# Patient Record
Sex: Male | Born: 1990 | Race: Black or African American | Hispanic: No | Marital: Single | Smoking: Never smoker
Health system: Southern US, Community
[De-identification: ages and names within clinical notes are randomized; demographics above are authoritative.]

## PROBLEM LIST (undated history)

## (undated) DIAGNOSIS — I10 Essential (primary) hypertension: Secondary | ICD-10-CM

## (undated) DIAGNOSIS — D649 Anemia, unspecified: Secondary | ICD-10-CM

## (undated) DIAGNOSIS — J4520 Mild intermittent asthma, uncomplicated: Secondary | ICD-10-CM

## (undated) DIAGNOSIS — K5909 Other constipation: Secondary | ICD-10-CM

## (undated) DIAGNOSIS — R7303 Prediabetes: Secondary | ICD-10-CM

## (undated) DIAGNOSIS — D573 Sickle-cell trait: Secondary | ICD-10-CM

## (undated) HISTORY — DX: Other constipation: K59.09

## (undated) HISTORY — DX: Anemia, unspecified: D64.9

## (undated) HISTORY — DX: Mild intermittent asthma, uncomplicated: J45.20

## (undated) HISTORY — DX: Morbid (severe) obesity due to excess calories: E66.01

---
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
## 2014-02-10 ENCOUNTER — Ambulatory Visit: Payer: Self-pay | Admitting: Family Medicine

## 2014-02-10 DIAGNOSIS — I517 Cardiomegaly: Secondary | ICD-10-CM

## 2014-11-09 ENCOUNTER — Emergency Department: Payer: Federal, State, Local not specified - PPO

## 2014-11-09 ENCOUNTER — Emergency Department
Admission: EM | Admit: 2014-11-09 | Discharge: 2014-11-09 | Disposition: A | Discharge status: Home / Self Care | Payer: Federal, State, Local not specified - PPO | Attending: Emergency Medicine | Admitting: Emergency Medicine

## 2014-11-09 ENCOUNTER — Encounter: Payer: Self-pay | Admitting: Emergency Medicine

## 2014-11-09 DIAGNOSIS — R109 Unspecified abdominal pain: Secondary | ICD-10-CM | POA: Insufficient documentation

## 2014-11-09 LAB — COMPREHENSIVE METABOLIC PANEL
ALT: 14 U/L — ABNORMAL LOW (ref 17–63)
AST: 18 U/L (ref 15–41)
Albumin: 3.8 g/dL (ref 3.5–5.0)
Alkaline Phosphatase: 69 U/L (ref 38–126)
Anion gap: 6 (ref 5–15)
BUN: 17 mg/dL (ref 6–20)
CO2: 29 mmol/L (ref 22–32)
Calcium: 9 mg/dL (ref 8.9–10.3)
Chloride: 105 mmol/L (ref 101–111)
Creatinine, Ser: 0.97 mg/dL (ref 0.61–1.24)
GFR calc Af Amer: >60 mL/min (ref >60)
GFR calc non Af Amer: >60 mL/min (ref >60)
Glucose, Bld: 109 mg/dL — ABNORMAL HIGH (ref 65–99)
Potassium: 4 mmol/L (ref 3.5–5.1)
Sodium: 140 mmol/L (ref 135–145)
Total Bilirubin: 0.5 mg/dL (ref 0.3–1.2)
Total Protein: 7.6 g/dL (ref 6.5–8.1)

## 2014-11-09 LAB — CBC
HCT: 36.6 % — ABNORMAL LOW (ref 40.0–52.0)
Hemoglobin: 11.8 g/dL — ABNORMAL LOW (ref 13.0–18.0)
MCH: 23.6 pg — ABNORMAL LOW (ref 26.0–34.0)
MCHC: 32.2 g/dL (ref 32.0–36.0)
MCV: 73.2 fL — ABNORMAL LOW (ref 80.0–100.0)
Platelets: 209 10*3/uL (ref 150–440)
RBC: 5 MIL/uL (ref 4.40–5.90)
RDW: 15.2 % — ABNORMAL HIGH (ref 11.5–14.5)
WBC: 7.5 10*3/uL (ref 3.8–10.6)

## 2014-11-09 LAB — URINALYSIS COMPLETE WITH MICROSCOPIC (ARMC ONLY)
Bacteria, UA: NONE SEEN
Bilirubin Urine: NEGATIVE
Glucose, UA: NEGATIVE mg/dL
Hgb urine dipstick: NEGATIVE
Ketones, ur: NEGATIVE mg/dL
Leukocytes, UA: NEGATIVE
Nitrite: NEGATIVE
Protein, ur: NEGATIVE mg/dL
Specific Gravity, Urine: 1.016 (ref 1.005–1.030)
Squamous Epithelial / LPF: NONE SEEN
pH: 6 (ref 5.0–8.0)

## 2014-11-09 LAB — LIPASE, BLOOD: Lipase: 30 U/L (ref 22–51)

## 2014-11-09 MED ORDER — GI COCKTAIL ~~LOC~~
ORAL | Status: AC
  Filled 2014-11-09: qty 30

## 2014-11-09 MED ORDER — GI COCKTAIL ~~LOC~~
30.0000 mL | Freq: Once | ORAL | Status: AC
  Administered 2014-11-09: 30 mL via ORAL

## 2014-11-09 NOTE — ED Notes (Signed)
Patient ambulatory to triage with steady gait, without difficulty or distress noted; pt reports pain to right side; seen at urgent care Monday and referred to GI but awaiting referral call; rx dyclocomine without relief; denies abd or back pain, stating only hurts in side; denies accomp symptoms, denies hx of same

## 2014-11-09 NOTE — ED Provider Notes (Signed)
[facility] Emergency Department Provider Note  ____________________________________________  Time seen: Approximately 7:49 AM  I have reviewed the triage vital signs and the nursing notes.   HISTORY  Chief Complaint Abdominal Pain    HPI [name] is a 24 y.o. male reports no past medical history, has been having achy pain in the right flank and right upper abdomen for the last 3 weeks. He first noticed an achy "discomfort" that is hard to describe in the right upper abdomen about 3 weeks ago, this lasted for a few days and went away. He was seen for at urgent care for this and advised to follow-up with GI. He states about 4 days ago this pain came back again, and continues to be persistent and achy in the right flank and right upper quadrant.  Of pertinence, he has not had any fever, nausea, vomiting, diarrhea, chest pain, trouble breathing. He denies having pain in the right groin or in the right lower abdomen. Walking and bumps do not aggravate the pain.  He presently describes just a mild achiness in the right upper abdomen.   History reviewed. No pertinent past medical history.  Patient Active Problem List   Diagnosis Date Noted  . CERUMEN IMPACTION, LEFT 07/26/2009  . FATIGUE 07/05/2009  . PENILE PAIN 02/06/2009  . NAUSEA 02/06/2009  . HEADACHE 09/12/2008  . CONSTIPATION 04/27/2008  . LOW BACK PAIN, MILD 04/27/2008  . ELEVATED BLOOD PRESSURE WITHOUT DIAGNOSIS OF HYPERTENSION 03/17/2008  . OBESITY, UNSPECIFIED 02/08/2008  . FECAL IMPACTION 02/08/2008    History reviewed. No pertinent past surgical history.  No current outpatient prescriptions on file.  Allergies Review of patient's allergies indicates no known allergies.  No family history on file.  Social History History  Substance Use Topics  . Smoking status: Never Smoker   . Smokeless tobacco: Never Used  . Alcohol Use: Not on file    Review of Systems Constitutional: No  fever/chills Eyes: No visual changes. ENT: No sore throat. Cardiovascular: Denies chest pain. Respiratory: Denies shortness of breath. Gastrointestinal: See history of present illness No nausea, no vomiting.  No diarrhea.  No constipation. Genitourinary: Negative for dysuria. Musculoskeletal: Negative for back pain. Skin: Negative for rash. Neurological: Negative for headaches, focal weakness or numbness. No testicular pain. No swelling in his groin or testicles. 10-point ROS otherwise negative.  ____________________________________________   PHYSICAL EXAM:  VITAL SIGNS: ED Triage Vitals  Enc Vitals Group     BP 11/09/14 0654 176/99 mmHg     Pulse Rate 11/09/14 0654 89     Resp 11/09/14 0654 20     Temp 11/09/14 0654 97.9 F (36.6 C)     Temp Source 11/09/14 0654 Oral     SpO2 11/09/14 0654 99 %     Weight 11/09/14 0654 415 lb (188.243 kg)     Height 11/09/14 0654  (1.93 m)     Head Cir --      Peak Flow --      Pain Score 11/09/14 0655 6     Pain Loc --      Pain Edu? --      Excl. in GC? --     Constitutional: Alert and oriented. Well appearing and in no acute distress. The patient is significantly overweight. Eyes: Conjunctivae are normal. PERRL. EOMI. Head: Atraumatic. Nose: No congestion/rhinnorhea. Mouth/Throat: Mucous membranes are moist.  Oropharynx non-erythematous. Neck: No stridor.   Cardiovascular: Normal rate, regular rhythm. Grossly normal heart sounds.  Good  peripheral circulation. Respiratory: Normal respiratory effort.  No retractions. Lungs CTAB. Gastrointestinal: Soft and nontender except for mild discomfort in the right flank and right upper quadrant. Eulah Pont sign is negative, though somewhat difficult to perform due to body habitus. No distention. No abdominal bruits. No CVA tenderness. There is no referred pain to the right with palpation in the left lower quadrant. There is no peritonitis. There is no rebound pain or guarding. Of note, deep  palpation in the right lower quadrant is nontender. Musculoskeletal: No lower extremity tenderness nor edema.  No joint effusions. Neurologic:  Normal speech and language. No gross focal neurologic deficits are appreciated. Speech is normal. No gait instability. Skin:  Skin is warm, dry and intact. No rash noted. Psychiatric: Mood and affect are normal. Speech and behavior are normal.  ____________________________________________   LABS (all labs ordered are listed, but only abnormal results are displayed)  Labs Reviewed  CBC  COMPREHENSIVE METABOLIC PANEL  LIPASE, BLOOD  URINALYSIS COMPLETEWITH MICROSCOPIC ([facility] ONLY)   ____________________________________________  EKG   ____________________________________________  RADIOLOGY  IMPRESSION: Probable fatty infiltration of liver.  Splenomegaly.  Small RIGHT renal cyst.   Electronically Signed By: Ulyses Southward M.D. On: 11/09/2014 09:56          DG Abd 1 View (Final result) Result time: 11/09/14 08:07:06   Final result by Rad Results In Interface (11/09/14 08:07:06)   Narrative:   CLINICAL DATA: RIGHT-side abdominal pain  EXAM: ABDOMEN - 1 VIEW  COMPARISON: 04/25/2008  FINDINGS: Normal bowel gas pattern.  No bowel dilatation, bowel wall thickening, or evidence of obstruction.  Osseous structures normal.  No urinary tract calcification.  IMPRESSION: Normal exam.     ____________________________________________   PROCEDURES  Procedure(s) performed: None  Critical Care performed: No  ____________________________________________   INITIAL IMPRESSION / ASSESSMENT AND PLAN / ED COURSE  Pertinent labs & imaging results that were available during my care of the patient were reviewed by me and considered in my medical decision making (see chart for details).  Mr. Justiniano presents with right sided abdominal pain is been intermittent for the last 2-3 weeks. He was seen in urgent care and  advised to follow-up with GI. He states his pain is ongoing and just not getting better. Based on his symptomatology and exam, I do not believe that he had represents an acute abdomen. We will however evaluate him for the evidence of cholelithiasis, nephrolithiasis, UTI. He has no symptoms consistent with acute bowel obstruction or appendicitis.  I did discuss with the patient that due to his weight we would not be able to obtain a CT scan. We discussed the benefits of CT which includes visualization of the kidneys, gallbladder, bowels, and appendix. I did discuss with him that in order to obtain CT imaging we would have to transfer him to different hospital, and he understands this. After our discussion and risks and benefits of transfer and CT scan, patient and I are both agreeable that we will obtain ultrasound and x-ray here. I do not find him to have high risk factors for appendicitis. The patient is quite agreeable, we will proceed with imaging here. I did discuss with them, that if he is to develop a fever vomiting, severe pain, pain in the right lower abdomen, or worsening symptoms that he should return to have a CT scan arranged. He is agreeable with this plan.  ----------------------------------------- 10:19 AM on 11/09/2014 -----------------------------------------  Patient's labs, x-ray, and ultrasound are reassuring. He is awake alert and  in no distress. On repeat exam he has no right lower quadrant pain. At this point, I discussed the patient close return precautions. He is quite agreeable. He will follow-up with gastroenterology as previously planned, and he will return to the ER right away if he develops a fever, pain in the right lower abdomen, vomiting, worsening symptoms or other new concerns arise. ____________________________________________   FINAL CLINICAL IMPRESSION(S) / ED DIAGNOSES  Final diagnoses:  Right flank discomfort      Sharyn Creamer, MD 11/09/14 1020

## 2014-11-09 NOTE — Discharge Instructions (Signed)
Abdominal Pain  Please call gastroenterology to set up a follow-up appointment. As we discussed, return to the ER right away if you develop a fever, severe pain in the right lower abdomen, vomiting, worsening symptoms, or other new concerns arise.  Many things can cause abdominal pain. Usually, abdominal pain is not caused by a disease and will improve without treatment. It can often be observed and treated at home. Your health care provider will do a physical exam and possibly order blood tests and X-rays to help determine the seriousness of your pain. However, in many cases, more time must pass before a clear cause of the pain can be found. Before that point, your health care provider may not know if you need more testing or further treatment. HOME CARE INSTRUCTIONS  Monitor your abdominal pain for any changes. The following actions may help to alleviate any discomfort you are experiencing:  Only take over-the-counter or prescription medicines as directed by your health care provider.  Do not take laxatives unless directed to do so by your health care provider.  Try a clear liquid diet (broth, tea, or water) as directed by your health care provider. Slowly move to a bland diet as tolerated. SEEK MEDICAL CARE IF:  You have unexplained abdominal pain.  You have abdominal pain associated with nausea or diarrhea.  You have pain when you urinate or have a bowel movement.  You experience abdominal pain that wakes you in the night.  You have abdominal pain that is worsened or improved by eating food.  You have abdominal pain that is worsened with eating fatty foods.  You have a fever. SEEK IMMEDIATE MEDICAL CARE IF:   Your pain does not go away within 2 hours.  You keep throwing up (vomiting).  Your pain is felt only in portions of the abdomen, such as the right side or the left lower portion of the abdomen.  You pass bloody or black tarry stools. MAKE SURE YOU:  Understand these  instructions.   Will watch your condition.   Will get help right away if you are not doing well or get worse.  Document Released: 02/19/2005 Document Revised: 05/17/2013 Document Reviewed: 01/19/2013 [facility] Patient Information 2015 [place], [place]. This information is not intended to replace advice given to you by your health care provider. Make sure you discuss any questions you have with your health care provider.

## 2014-12-08 ENCOUNTER — Other Ambulatory Visit: Payer: Self-pay

## 2014-12-08 ENCOUNTER — Ambulatory Visit: Payer: Federal, State, Local not specified - PPO

## 2014-12-08 ENCOUNTER — Emergency Department: Payer: Federal, State, Local not specified - PPO

## 2014-12-08 ENCOUNTER — Emergency Department
Admission: EM | Admit: 2014-12-08 | Discharge: 2014-12-08 | Disposition: A | Discharge status: Home / Self Care | Payer: Federal, State, Local not specified - PPO | Attending: Student | Admitting: Student

## 2014-12-08 DIAGNOSIS — J029 Acute pharyngitis, unspecified: Secondary | ICD-10-CM | POA: Diagnosis not present

## 2014-12-08 DIAGNOSIS — J45901 Unspecified asthma with (acute) exacerbation: Secondary | ICD-10-CM | POA: Insufficient documentation

## 2014-12-08 DIAGNOSIS — R000 Tachycardia, unspecified: Secondary | ICD-10-CM | POA: Diagnosis not present

## 2014-12-08 DIAGNOSIS — R0602 Shortness of breath: Secondary | ICD-10-CM

## 2014-12-08 DIAGNOSIS — R079 Chest pain, unspecified: Secondary | ICD-10-CM | POA: Diagnosis not present

## 2014-12-08 LAB — COMPREHENSIVE METABOLIC PANEL
ALT: 14 U/L — ABNORMAL LOW (ref 17–63)
AST: 23 U/L (ref 15–41)
Albumin: 3.9 g/dL (ref 3.5–5.0)
Alkaline Phosphatase: 71 U/L (ref 38–126)
Anion gap: 8 (ref 5–15)
BUN: 12 mg/dL (ref 6–20)
CO2: 27 mmol/L (ref 22–32)
Calcium: 9 mg/dL (ref 8.9–10.3)
Chloride: 103 mmol/L (ref 101–111)
Creatinine, Ser: 0.88 mg/dL (ref 0.61–1.24)
GFR calc Af Amer: >60 mL/min (ref >60)
GFR calc non Af Amer: >60 mL/min (ref >60)
Glucose, Bld: 134 mg/dL — ABNORMAL HIGH (ref 65–99)
Potassium: 3.4 mmol/L — ABNORMAL LOW (ref 3.5–5.1)
Sodium: 138 mmol/L (ref 135–145)
Total Bilirubin: 0.6 mg/dL (ref 0.3–1.2)
Total Protein: 7.7 g/dL (ref 6.5–8.1)

## 2014-12-08 LAB — CBC WITH DIFFERENTIAL/PLATELET
Basophils Absolute: 0.1 10*3/uL (ref 0–0.1)
Basophils Relative: 1 %
Eosinophils Absolute: 0 10*3/uL (ref 0–0.7)
Eosinophils Relative: 0 %
HCT: 36.7 % — ABNORMAL LOW (ref 40.0–52.0)
Hemoglobin: 12 g/dL — ABNORMAL LOW (ref 13.0–18.0)
Lymphocytes Relative: 9 %
Lymphs Abs: 0.9 10*3/uL — ABNORMAL LOW (ref 1.0–3.6)
MCH: 23.7 pg — ABNORMAL LOW (ref 26.0–34.0)
MCHC: 32.8 g/dL (ref 32.0–36.0)
MCV: 72.3 fL — ABNORMAL LOW (ref 80.0–100.0)
Monocytes Absolute: 0.3 10*3/uL (ref 0.2–1.0)
Monocytes Relative: 3 %
Neutro Abs: 7.9 10*3/uL — ABNORMAL HIGH (ref 1.4–6.5)
Neutrophils Relative %: 87 %
Platelets: 173 10*3/uL (ref 150–440)
RBC: 5.07 MIL/uL (ref 4.40–5.90)
RDW: 15.1 % — ABNORMAL HIGH (ref 11.5–14.5)
WBC: 9.1 10*3/uL (ref 3.8–10.6)

## 2014-12-08 LAB — LIPASE, BLOOD: Lipase: 14 U/L — ABNORMAL LOW (ref 22–51)

## 2014-12-08 LAB — TROPONIN I: Troponin I: <0.03 ng/mL (ref <0.031)

## 2014-12-08 LAB — FIBRIN DERIVATIVES D-DIMER (ARMC ONLY): Fibrin derivatives D-dimer (ARMC): 594.46 — ABNORMAL HIGH (ref 0–499)

## 2014-12-08 LAB — POCT RAPID STREP A: Streptococcus, Group A Screen (Direct): NEGATIVE

## 2014-12-08 MED ORDER — TECHNETIUM TO 99M ALBUMIN AGGREGATED
6.5300 | Freq: Once | INTRAVENOUS | Status: AC | PRN
  Administered 2014-12-08: 6.53 via INTRAVENOUS

## 2014-12-08 MED ORDER — SODIUM CHLORIDE 0.9 % IV BOLUS (SEPSIS)
500.0000 mL | Freq: Once | INTRAVENOUS | Status: AC
  Administered 2014-12-08: 500 mL via INTRAVENOUS

## 2014-12-08 MED ORDER — TECHNETIUM TC 99M DIETHYLENETRIAME-PENTAACETIC ACID
54.4600 | Freq: Once | INTRAVENOUS | Status: AC | PRN
  Administered 2014-12-08: 54.46 via RESPIRATORY_TRACT

## 2014-12-08 MED ORDER — KETOROLAC TROMETHAMINE 30 MG/ML IJ SOLN
30.0000 mg | Freq: Once | INTRAMUSCULAR | Status: AC
  Administered 2014-12-08: 30 mg via INTRAVENOUS
  Filled 2014-12-08: qty 1

## 2014-12-08 MED ORDER — ALBUTEROL SULFATE HFA 108 (90 BASE) MCG/ACT IN AERS: 2.0000 | INHALATION_SPRAY | Freq: Four times a day (QID) | RESPIRATORY_TRACT | Status: DC | PRN

## 2014-12-08 NOTE — ED Notes (Signed)
Patient transported to X-ray 

## 2014-12-08 NOTE — ED Notes (Signed)
Pt still awaiting Nuclear Medicine procedure.

## 2014-12-08 NOTE — Discharge Instructions (Signed)

## 2014-12-08 NOTE — ED Notes (Signed)
Pt to Nuclear Medicine.

## 2014-12-08 NOTE — ED Provider Notes (Addendum)
[facility] Emergency Department Provider Note  ____________________________________________  Time seen: Approximately 7:07 AM  I have reviewed the triage vital signs and the nursing notes.   HISTORY  Chief Complaint Breathing Problem    HPI [name] is a 24 y.o. male with remote history of asthma presents for evaluation of shortness of breath and mild pleuritic chest pain, sudden onset this morning. Patient reports that he developed sore throat yesterday and he has had "cold sweats". Today he awoke from sleep and was walking his small dog outside when he suddenly began to feel short of breath with some chest pain. On EMS arrival, wheezing was auscultated and he received albuterol, Atrovent and Solu-Medrol. At this time he reports he does feel better. He reports that earlier this morning he also had 2 episodes of nonbloody nonbilious emesis. 2 days ago he had several episodes of nonbloody diarrhea. Current severity of symptoms is mild. No modifying factors. He denies any hemoptysis, recent surgeries or recent prolonged period of immobilization, personal or family history of DVT or PE, hormone use, leg swelling.   No past medical history on file.  Patient Active Problem List   Diagnosis Date Noted  . CERUMEN IMPACTION, LEFT 07/26/2009  . FATIGUE 07/05/2009  . PENILE PAIN 02/06/2009  . NAUSEA 02/06/2009  . HEADACHE 09/12/2008  . CONSTIPATION 04/27/2008  . LOW BACK PAIN, MILD 04/27/2008  . ELEVATED BLOOD PRESSURE WITHOUT DIAGNOSIS OF HYPERTENSION 03/17/2008  . OBESITY, UNSPECIFIED 02/08/2008  . FECAL IMPACTION 02/08/2008    No past surgical history on file.  Current Outpatient Rx  Name  Route  Sig  Dispense  Refill  . dicyclomine (BENTYL) 10 MG capsule   Oral   Take 1 capsule by mouth 4 (four) times daily as needed.      0     Allergies Review of patient's allergies indicates no known allergies.  No family history on file.  Social  History History  Substance Use Topics  . Smoking status: Never Smoker   . Smokeless tobacco: Never Used  . Alcohol Use: Not on file    Review of Systems Constitutional: No fever/chills Eyes: No visual changes. ENT: No sore throat. Cardiovascular: + chest pain. Respiratory: + shortness of breath. Gastrointestinal: No abdominal pain.  No nausea, + vomiting.  +diarrhea.  No constipation. Genitourinary: Negative for dysuria. Musculoskeletal: Negative for back pain. Skin: Negative for rash. Neurological: Negative for headaches, focal weakness or numbness.  10-point ROS otherwise negative.  ____________________________________________   PHYSICAL EXAM:  VITAL SIGNS: ED Triage Vitals  Enc Vitals Group     BP 12/08/14 0703 136/87 mmHg     Pulse Rate 12/08/14 0703 102     Resp --      Temp 12/08/14 0703 97.7 F (36.5 C)     Temp Source 12/08/14 0703 Oral     SpO2 12/08/14 0703 100 %     Weight 12/08/14 0703 420 lb (190.511 kg)     Height 12/08/14 0703 6\' 4"  (1.93 m)     Head Cir --      Peak Flow --      Pain Score 12/08/14 0704 5     Pain Loc --      Pain Edu? --      Excl. in GC? --     Constitutional: Alert and oriented. Well appearing and in no acute distress. Eyes: Conjunctivae are normal. PERRL. EOMI. Head: Atraumatic. Nose: No congestion/rhinnorhea. Mouth/Throat: Mucous membranes are moist.  Oropharynx mildly  erythematous. Neck: No stridor.   Cardiovascular: mildly tachycarduc rate, regular rhythm. Grossly normal heart sounds.  Good peripheral circulation. Respiratory: Normal respiratory effort.  No retractions. Lungs CTAB. Gastrointestinal: Soft and nontender. No distention. No abdominal bruits. No CVA tenderness. Genitourinary: deferred Musculoskeletal: No lower extremity tenderness nor edema.  No joint effusions. Neurologic:  Normal speech and language. No gross focal neurologic deficits are appreciated. No gait instability. Skin:  Skin is warm, dry and  intact. No rash noted. Psychiatric: Mood and affect are normal. Speech and behavior are normal.  ____________________________________________   LABS (all labs ordered are listed, but only abnormal results are displayed)  Labs Reviewed  CBC WITH DIFFERENTIAL/PLATELET  COMPREHENSIVE METABOLIC PANEL  TROPONIN I  LIPASE, BLOOD   ____________________________________________  EKG  ED ECG REPORT I, Gayla Doss, the attending physician, personally viewed and interpreted this ECG.   Date: 12/08/2014  EKG Time: 08:03  Rate: 98  Rhythm: normal sinus rhythm  Axis: normal  Intervals:none  ST&T Change: no acute ST segment elevation, LVH   VQ scan IMPRESSION: No ventilation or perfusion defects. This study constitutes a very low probability of pulmonary embolus.  ____________________________________________  RADIOLOGY  CXR FINDINGS: The heart size and mediastinal contours are within normal limits. Both lungs are clear. The visualized skeletal structures are unremarkable.  IMPRESSION: Negative. No active cardiopulmonary disease.  ____________________________________________   PROCEDURES  Procedure(s) performed: None  Critical Care performed: No  ____________________________________________   INITIAL IMPRESSION / ASSESSMENT AND PLAN / ED COURSE  Pertinent labs & imaging results that were available during my care of the patient were reviewed by me and considered in my medical decision making (see chart for details).  [name] is a 24 y.o. male with remote history of asthma presents for evaluation of shortness of breath and mild chest pain, sudden onset this morning. On exam, he is generally well-appearing and in no acute distress. Mildly tachycardic but vital signs otherwise stable. He has mild erythema of the oropharynx. No wheeze/lungs clear. Plan for basic cardiac labs that I doubt ACS, chest x-ray, we'll swab for strep, given his complaint of pleuritic hest  pain, tachycardia, we'll obtain d-dimer, we'll give IV fluids and Toradol.  ----------------------------------------- 11:34 AM on 12/08/2014 -----------------------------------------  Strep negative. Labs generally unremarkable with the exception of D-dimer which is elevated. The patient is above the weight limit for CT scanner therefore we will obtain a VQ scan to rule out PE.  ----------------------------------------- 3:02 PM on 12/08/2014 ----------------------------------------- VQ scan negative for PE. Patient is asymptomatic.tachycardia resolved. No vomiting since arrival to the emergency department he is tolerating by mouth intake. Suspect possible bronchitis as the cause of his shortness of breath this morning in the setting of acute viral illness. We'll discharge with albuterol inhaler, return precautions, close PCP follow-up. Doubt acute aortic dissection. Return precautions discussed and he is comfortable with the discharge plan.  ____________________________________________   FINAL CLINICAL IMPRESSION(S) / ED DIAGNOSES  Final diagnoses:  SOB (shortness of breath)  Chest pain, unspecified chest pain type  Sore throat      Gayla Doss, MD 12/08/14 1503  Gayla Doss, MD 12/08/14 1507

## 2014-12-08 NOTE — ED Notes (Signed)
Pt in via EMS from with c/o difficulty breathing this am. EMS reports pt wih hx of asthma as child but no issues until this am. EMS reports pt wheezing in lungs and tight. EMS administered 15ml of albuterol, 1.0 atrovent and  solu-medrol.

## 2014-12-10 LAB — CULTURE, GROUP A STREP (THRC)

## 2015-03-30 ENCOUNTER — Encounter: Payer: Self-pay | Admitting: Family Medicine

## 2018-01-21 ENCOUNTER — Ambulatory Visit: Payer: Self-pay | Admitting: Family Medicine

## 2018-04-19 ENCOUNTER — Encounter

## 2018-04-19 ENCOUNTER — Encounter: Payer: Self-pay | Admitting: Family Medicine

## 2018-04-19 ENCOUNTER — Ambulatory Visit: Payer: Federal, State, Local not specified - PPO | Admitting: Family Medicine

## 2018-04-19 DIAGNOSIS — Z131 Encounter for screening for diabetes mellitus: Secondary | ICD-10-CM | POA: Diagnosis not present

## 2018-04-19 DIAGNOSIS — Z130 Encounter for screening for diseases of the blood and blood-forming organs and certain disorders involving the immune mechanism: Secondary | ICD-10-CM

## 2018-04-19 DIAGNOSIS — Z8349 Family history of other endocrine, nutritional and metabolic diseases: Secondary | ICD-10-CM

## 2018-04-19 DIAGNOSIS — J4520 Mild intermittent asthma, uncomplicated: Secondary | ICD-10-CM

## 2018-04-19 DIAGNOSIS — Z23 Encounter for immunization: Secondary | ICD-10-CM | POA: Diagnosis not present

## 2018-04-19 DIAGNOSIS — Z13220 Encounter for screening for lipoid disorders: Secondary | ICD-10-CM

## 2018-04-19 MED ORDER — ALBUTEROL SULFATE HFA 108 (90 BASE) MCG/ACT IN AERS: 2.0000 | INHALATION_SPRAY | Freq: Four times a day (QID) | RESPIRATORY_TRACT | 0 refills | Status: DC | PRN

## 2018-04-19 MED ORDER — MONTELUKAST SODIUM 10 MG PO TABS: 10.0000 mg | ORAL_TABLET | Freq: Every day | ORAL | 0 refills | Status: DC

## 2018-04-19 NOTE — Patient Instructions (Signed)
Obesity, Adult  Obesity is the condition of having too much total body fat. Being overweight or obese means that your weight is greater than what is considered healthy for your body size. Obesity is determined by a measurement called BMI. BMI is an estimate of body fat and is calculated from height and weight. For adults, a BMI of 30 or higher is considered obese.  Obesity can eventually lead to other health concerns and major illnesses, including:  · Stroke.  · Coronary artery disease (CAD).  · Type 2 diabetes.  · Some types of cancer, including cancers of the colon, breast, uterus, and gallbladder.  · Osteoarthritis.  · High blood pressure (hypertension).  · High cholesterol.  · Sleep apnea.  · Gallbladder stones.  · Infertility problems.    What are the causes?  The main cause of obesity is taking in (consuming) more calories than your body uses for energy. Other factors that contribute to this condition may include:  · Being born with genes that make you more likely to become obese.  · Having a medical condition that causes obesity. These conditions include:  ? Hypothyroidism.  ? Polycystic ovarian syndrome (PCOS).  ? Binge-eating disorder.  ? Cushing syndrome.  · Taking certain medicines, such as steroids, antidepressants, and seizure medicines.  · Not being physically active (sedentary lifestyle).  · Living where there are limited places to exercise safely or buy healthy foods.  · Not getting enough sleep.    What increases the risk?  The following factors may increase your risk of this condition:  · Having a family history of obesity.  · Being a woman of African-American descent.  · Being a man of Hispanic descent.    What are the signs or symptoms?  Having excessive body fat is the main symptom of this condition.  How is this diagnosed?  This condition may be diagnosed based on:  · Your symptoms.  · Your medical history.  · A physical exam. Your health care provider may measure:  ? Your BMI. If you are an  adult with a BMI between 25 and less than 30, you are considered overweight. If you are an adult with a BMI of 30 or higher, you are considered obese.  ? The distances around your hips and your waist (circumferences). These may be compared to each other to help diagnose your condition.  ? Your skinfold thickness. Your health care provider may gently pinch a fold of your skin and measure it.    How is this treated?  Treatment for this condition often includes changing your lifestyle. Treatment may include some or all of the following:  · Dietary changes. Work with your health care provider and a dietitian to set a weight-loss goal that is healthy and reasonable for you. Dietary changes may include eating:  ? Smaller portions. A portion size is the amount of a particular food that is healthy for you to eat at one time. This varies from person to person.  ? Low-calorie or low-fat options.  ? More whole grains, fruits, and vegetables.  · Regular physical activity. This may include aerobic activity (cardio) and strength training.  · Medicine to help you lose weight. Your health care provider may prescribe medicine if you are unable to lose 1 pound a week after 6 weeks of eating more healthily and doing more physical activity.  · Surgery. Surgical options may include gastric banding and gastric bypass. Surgery may be done if:  ? Other   treatments have not helped to improve your condition.  ? You have a BMI of 40 or higher.  ? You have life-threatening health problems related to obesity.    Follow these instructions at home:    Eating and drinking    · Follow recommendations from your health care provider about what you eat and drink. Your health care provider may advise you to:  ? Limit fast foods, sweets, and processed snack foods.  ? Choose low-fat options, such as low-fat milk instead of whole milk.  ? Eat 5 or more servings of fruits or vegetables every day.  ? Eat at home more often. This gives you more control over  what you eat.  ? Choose healthy foods when you eat out.  ? Learn what a healthy portion size is.  ? Keep low-fat snacks on hand.  ? Avoid sugary drinks, such as soda, fruit juice, iced tea sweetened with sugar, and flavored milk.  ? Eat a healthy breakfast.  · Drink enough water to keep your urine clear or pale yellow.  · Do not go without eating for long periods of time (do not fast) or follow a fad diet. Fasting and fad diets can be unhealthy and even dangerous.  Physical Activity  · Exercise regularly, as told by your health care provider. Ask your health care provider what types of exercise are safe for you and how often you should exercise.  · Warm up and stretch before being active.  · Cool down and stretch after being active.  · Rest between periods of activity.  Lifestyle  · Limit the time that you spend in front of your TV, computer, or video game system.  · Find ways to reward yourself that do not involve food.  · Limit alcohol intake to no more than 1 drink a day for nonpregnant women and 2 drinks a day for men. One drink equals 12 oz of beer, 5 oz of wine, or 1½ oz of hard liquor.  General instructions  · Keep a weight loss journal to keep track of the food you eat and how much you exercise you get.  · Take over-the-counter and prescription medicines only as told by your health care provider.  · Take vitamins and supplements only as told by your health care provider.  · Consider joining a support group. Your health care provider may be able to recommend a support group.  · Keep all follow-up visits as told by your health care provider. This is important.  Contact a health care provider if:  · You are unable to meet your weight loss goal after 6 weeks of dietary and lifestyle changes.  This information is not intended to replace advice given to you by your health care provider. Make sure you discuss any questions you have with your health care provider.  Document Released: 06/19/2004 Document Revised:  10/15/2015 Document Reviewed: 02/28/2015  Elsevier Interactive Patient Education © 2018 [employer]

## 2018-04-19 NOTE — Progress Notes (Signed)
Name: [name]   MRN: [id number]    DOB: 06-10-1990   Date:04/19/2018       Progress Note  Subjective  Chief Complaint  Chief Complaint  Patient presents with  . Establish Care    HPI  Morbid obesity: he has always been obese. He has a family history of obesity also. Today is his heaviest weight. Lost 40 lbs with a Systems analyst and dietary modification earlier this year, but gained it back. He eats breakfast mostly at home, packs lunch but eats out of dinner most nights. He drinks sweet tea. He is currently not exercising on a regular basis. He states he likes to eat and does not have good portion control habits.   Asthma: he used to have episodes as a young child, he states get SOB a couple of times a week, improves with rest and taking slow breaths. He states he wakes up and when he is in the shower he has a productive coughing spell that makes him gag, not associated with nausea, no heartburn and no history of regurgitation.    Patient Active Problem List   Diagnosis Date Noted  . Asthma, mild intermittent   . Morbid obesity (HCC) 02/08/2008    History reviewed. No pertinent surgical history.  Family History  Problem Relation Age of Onset  . Obesity Mother   . Kidney disease Father        mass  . Obesity Sister     Social History   Socioeconomic History  . Marital status: Single    Spouse name: Not on file  . Number of children: 0  . Years of education: Not on file  . Highest education level: Some college, no degree  Occupational History  . Occupation: Management consultant   Social Needs  . Financial resource strain: Not hard at all  . Food insecurity:    Worry: Never true    Inability: Never true  . Transportation needs:    Medical: No    Non-medical: No  Tobacco Use  . Smoking status: Never Smoker  . Smokeless tobacco: Never Used  Substance and Sexual Activity  . Alcohol use: Yes    Alcohol/week: 1.0 standard drinks    Types: 1 Cans of beer  per week  . Drug use: Never  . Sexual activity: Not Currently    Partners: Female    Birth control/protection: Condom    Comment: not always   Lifestyle  . Physical activity:    Days per week: 2 days    Minutes per session: 20 min  . Stress: To some extent  Relationships  . Social connections:    Talks on phone: More than three times a week    Gets together: Twice a week    Attends religious service: Never    Active member of club or organization: No    Attends meetings of clubs or organizations: Never    Relationship status: Never married  . Intimate partner violence:    Fear of current or ex partner: No    Emotionally abused: No    Physically abused: No    Forced sexual activity: No  Other Topics Concern  . Not on file  Social History Narrative  . Not on file     Current Outpatient Medications:  .  albuterol (PROVENTIL HFA;VENTOLIN HFA) 108 (90 BASE) MCG/ACT inhaler, Inhale 2 puffs into the lungs every 6 (six) hours as needed for wheezing or shortness of breath. (Patient not taking: Reported  on 04/19/2018), Disp: 1 Inhaler, Rfl: 0  No Known Allergies  I personally reviewed active problem list, medication list, allergies, family history, social history with the patient/caregiver today.   ROS  Constitutional: Negative for fever, positive for weight change.  Respiratory: Negative for cough and shortness of breath.   Cardiovascular: Negative for chest pain or palpitations.  Gastrointestinal: Negative for abdominal pain, no bowel changes.  Musculoskeletal: Negative for gait problem or joint swelling.  Skin: Negative for rash.  Neurological: Negative for dizziness or headache.  No other specific complaints in a complete review of systems (except as listed in HPI above).  Objective  Vitals:   04/19/18 1404  BP: 126/80  Pulse: 96  Resp: 12  Temp: 98 F (36.7 C)  TempSrc: Oral  SpO2: 97%  Weight: (!) 439 lb 9.6 oz (199.4 kg)  Height: 6' 1.25" (1.861 m)     Body mass index is 57.6 kg/m.  Physical Exam  Constitutional: Patient appears well-developed and well-nourished. Obese  No distress.  HEENT: head atraumatic, normocephalic, pupils equal and reactive to light,  neck supple, throat within normal limits Cardiovascular: Normal rate, regular rhythm and normal heart sounds.  No murmur heard. No BLE edema. Pulmonary/Chest: Effort normal and breath sounds normal. No respiratory distress. Abdominal: Soft.  There is no tenderness. Psychiatric: Patient has a normal mood and affect. behavior is normal. Judgment and thought content normal.  PHQ2/9: Depression screen PHQ 2/9 04/19/2018  Decreased Interest 0  Down, Depressed, Hopeless 0  PHQ - 2 Score 0     Fall Risk: Fall Risk  04/19/2018  Falls in the past year? 0  Number falls in past yr: 0  Injury with Fall? 0     Functional Status Survey: Is the patient deaf or have difficulty hearing?: No Does the patient have difficulty seeing, even when wearing glasses/contacts?: No Does the patient have difficulty concentrating, remembering, or making decisions?: No Does the patient have difficulty walking or climbing stairs?: No Does the patient have difficulty dressing or bathing?: No Does the patient have difficulty doing errands alone such as visiting a doctor's office or shopping?: No    Assessment & Plan   1. Morbid obesity (HCC)  - COMPLETE METABOLIC PANEL WITH GFR - TSH  2. Flu vaccine need  - Flu Vaccine QUAD 36+ mos IM  3. Family history of obesity   4. Diabetes mellitus screening  - Hemoglobin A1c  5. Lipid screening  - Lipid panel  6. Screening for iron deficiency anemia  - CBC with Differential/Platelet  7. Mild intermittent asthma without complication  - montelukast (SINGULAIR) 10 MG tablet; Take 1 tablet (10 mg total) by mouth at bedtime.  Dispense: 90 tablet; Refill: 0 - albuterol (PROVENTIL HFA;VENTOLIN HFA) 108 (90 Base) MCG/ACT inhaler; Inhale 2  puffs into the lungs every 6 (six) hours as needed for wheezing or shortness of breath.  Dispense: 1 Inhaler; Refill: 0

## 2018-04-21 ENCOUNTER — Other Ambulatory Visit: Payer: Self-pay

## 2018-04-21 DIAGNOSIS — D649 Anemia, unspecified: Secondary | ICD-10-CM

## 2018-04-22 LAB — LIPID PANEL
Cholesterol: 168 mg/dL (ref <200)
HDL: 38 mg/dL — ABNORMAL LOW (ref >40)
LDL Cholesterol (Calc): 113 mg/dL (calc) — ABNORMAL HIGH
Non-HDL Cholesterol (Calc): 130 mg/dL (calc) — ABNORMAL HIGH (ref <130)
Total CHOL/HDL Ratio: 4.4 (calc) (ref <5.0)
Triglycerides: 74 mg/dL (ref <150)

## 2018-04-22 LAB — COMPLETE METABOLIC PANEL WITH GFR
AG Ratio: 1.4 (calc) (ref 1.0–2.5)
ALT: 11 U/L (ref 9–46)
AST: 13 U/L (ref 10–40)
Albumin: 4.1 g/dL (ref 3.6–5.1)
Alkaline phosphatase (APISO): 69 U/L (ref 40–115)
BUN: 16 mg/dL (ref 7–25)
CO2: 27 mmol/L (ref 20–32)
Calcium: 9.4 mg/dL (ref 8.6–10.3)
Chloride: 103 mmol/L (ref 98–110)
Creat: 0.96 mg/dL (ref 0.60–1.35)
GFR, Est African American: 127 mL/min/{1.73_m2} (ref >=60)
GFR, Est Non African American: 109 mL/min/{1.73_m2} (ref >=60)
Globulin: 3 g/dL (calc) (ref 1.9–3.7)
Glucose, Bld: 83 mg/dL (ref 65–99)
Potassium: 3.9 mmol/L (ref 3.5–5.3)
Sodium: 139 mmol/L (ref 135–146)
Total Bilirubin: 0.6 mg/dL (ref 0.2–1.2)
Total Protein: 7.1 g/dL (ref 6.1–8.1)

## 2018-04-22 LAB — CBC WITH DIFFERENTIAL/PLATELET
Basophils Absolute: 39 cells/uL (ref 0–200)
Basophils Relative: 0.5 %
Eosinophils Absolute: 70 cells/uL (ref 15–500)
Eosinophils Relative: 0.9 %
HCT: 38.4 % — ABNORMAL LOW (ref 38.5–50.0)
Hemoglobin: 12.4 g/dL — ABNORMAL LOW (ref 13.2–17.1)
Lymphs Abs: 2044 cells/uL (ref 850–3900)
MCH: 23.4 pg — ABNORMAL LOW (ref 27.0–33.0)
MCHC: 32.3 g/dL (ref 32.0–36.0)
MCV: 72.6 fL — ABNORMAL LOW (ref 80.0–100.0)
MPV: 11.7 fL (ref 7.5–12.5)
Monocytes Relative: 6.1 %
Neutro Abs: 5171 cells/uL (ref 1500–7800)
Neutrophils Relative %: 66.3 %
Platelets: 264 10*3/uL (ref 140–400)
RBC: 5.29 10*6/uL (ref 4.20–5.80)
RDW: 14.5 % (ref 11.0–15.0)
Total Lymphocyte: 26.2 %
WBC mixed population: 476 cells/uL (ref 200–950)
WBC: 7.8 10*3/uL (ref 3.8–10.8)

## 2018-04-22 LAB — IRON,TIBC AND FERRITIN PANEL
%SAT: 13 % (calc) — ABNORMAL LOW (ref 20–48)
Ferritin: 71 ng/mL (ref 38–380)
Iron: 37 ug/dL — ABNORMAL LOW (ref 50–195)
TIBC: 278 mcg/dL (calc) (ref 250–425)

## 2018-04-22 LAB — TEST AUTHORIZATION

## 2018-04-22 LAB — HEMOGLOBIN A1C
Hgb A1c MFr Bld: 5.6 % of total Hgb (ref <5.7)
Mean Plasma Glucose: 114 (calc)
eAG (mmol/L): 6.3 (calc)

## 2018-04-22 LAB — TSH: TSH: 1.09 mIU/L (ref 0.40–4.50)

## 2018-05-18 ENCOUNTER — Other Ambulatory Visit: Payer: Self-pay | Admitting: Family Medicine

## 2018-05-18 DIAGNOSIS — J4520 Mild intermittent asthma, uncomplicated: Secondary | ICD-10-CM

## 2018-05-18 NOTE — Telephone Encounter (Signed)
Refill request for general medication: Albuterol Inhaler  Last office visit: 04/19/2018  Last physical exam:  None indicated. Need physical  Follow-ups on file. 07/20/2018

## 2018-07-20 ENCOUNTER — Encounter: Payer: Federal, State, Local not specified - PPO | Admitting: Family Medicine

## 2018-09-20 ENCOUNTER — Other Ambulatory Visit: Payer: Self-pay

## 2018-09-20 ENCOUNTER — Encounter: Payer: Self-pay | Admitting: Family Medicine

## 2018-09-20 ENCOUNTER — Ambulatory Visit (INDEPENDENT_AMBULATORY_CARE_PROVIDER_SITE_OTHER): Payer: Federal, State, Local not specified - PPO | Admitting: Family Medicine

## 2018-09-20 DIAGNOSIS — J302 Other seasonal allergic rhinitis: Secondary | ICD-10-CM | POA: Diagnosis not present

## 2018-09-20 DIAGNOSIS — J4520 Mild intermittent asthma, uncomplicated: Secondary | ICD-10-CM

## 2018-09-20 DIAGNOSIS — R0982 Postnasal drip: Secondary | ICD-10-CM

## 2018-09-20 MED ORDER — AZELASTINE HCL 0.1 % NA SOLN: 2.0000 | Freq: Two times a day (BID) | NASAL | 2 refills | Status: DC

## 2018-09-20 MED ORDER — LORATADINE 10 MG PO TABS: 10.0000 mg | ORAL_TABLET | Freq: Every day | ORAL | 3 refills | Status: DC

## 2018-09-20 MED ORDER — FLUTICASONE PROPIONATE 50 MCG/ACT NA SUSP: 2.0000 | Freq: Every day | NASAL | 2 refills | Status: DC

## 2018-09-20 NOTE — Progress Notes (Signed)
Name: [name]   MRN: [id number]    DOB: 07-28-1990   Date:09/20/2018       Progress Note  Subjective  Chief Complaint  Chief Complaint  Patient presents with  . Obesity  . Asthma    I connected with  [name]  on 09/20/18 at 11:00 AM EDT by a video enabled telemedicine application and verified that I am speaking with the correct person using two identifiers.  I discussed the limitations of evaluation and management by telemedicine and the availability of in person appointments. The patient expressed understanding and agreed to proceed. Staff also discussed with the patient that there may be a patient responsible charge related to this service. Patient Location: at work  Provider Location: [facility]   HPI  Morbid obesity: he has always been obese. He has a family history of obesity also. Today is his heaviest weight. He states when he used a Systems analyst he lost 40 lbs, he has not been very active lately, but he has been avoiding fast food, grilling food at home, he is eating a lot of vegetables and fruit. He drinks sweet drink seldom now, mostly water and Chrystal light.   Asthma: he used to have episodes as a young child, he states since last visit no longer has SOB. He states he wakes up and when he is in the shower he has a productive coughing spell that makes him gag, not associated with nausea, no heartburn and no history of regurgitation, episodes no longer daily, but still a few times a week  AR: he states he needs to blow his nose every morning, post-nasal drainage, denies itchy nose or eyes. He is using nasal and did not noticed an improvement   Patient Active Problem List   Diagnosis Date Noted  . Asthma, mild intermittent   . Morbid obesity (HCC) 02/08/2008    History reviewed. No pertinent surgical history.  Family History  Problem Relation Age of Onset  . Obesity Mother   . Kidney disease Father        mass  . Obesity Sister     Social History   Socioeconomic History  . Marital status: Single    Spouse name: Not on file  . Number of children: 0  . Years of education: Not on file  . Highest education level: Some college, no degree  Occupational History  . Occupation: Management consultant   Social Needs  . Financial resource strain: Not hard at all  . Food insecurity:    Worry: Never true    Inability: Never true  . Transportation needs:    Medical: No    Non-medical: No  Tobacco Use  . Smoking status: Never Smoker  . Smokeless tobacco: Never Used  Substance and Sexual Activity  . Alcohol use: Yes    Alcohol/week: 1.0 standard drinks    Types: 1 Cans of beer per week  . Drug use: Never  . Sexual activity: Not Currently    Partners: Female    Birth control/protection: Condom    Comment: not always   Lifestyle  . Physical activity:    Days per week: 2 days    Minutes per session: 20 min  . Stress: To some extent  Relationships  . Social connections:    Talks on phone: More than three times a week    Gets together: Twice a week    Attends religious service: Never    Active member of club or  organization: No    Attends meetings of clubs or organizations: Never    Relationship status: Never married  . Intimate partner violence:    Fear of current or ex partner: No    Emotionally abused: No    Physically abused: No    Forced sexual activity: No  Other Topics Concern  . Not on file  Social History Narrative  . Not on file     Current Outpatient Medications:  .  albuterol (PROVENTIL HFA;VENTOLIN HFA) 108 (90 Base) MCG/ACT inhaler, TAKE 2 PUFFS BY MOUTH EVERY 6 HOURS AS NEEDED FOR WHEEZE OR SHORTNESS OF BREATH, Disp: 18 Inhaler, Rfl: 2 .  montelukast (SINGULAIR) 10 MG tablet, Take 1 tablet (10 mg total) by mouth at bedtime., Disp: 90 tablet, Rfl: 0  No Known Allergies  I personally reviewed active problem list, medication list, allergies, family history with the patient/caregiver  today.   ROS  Ten systems reviewed and is negative except as mentioned in HPI   Objective  Virtual encounter, vitals not obtained.   Physical Exam  Awake, alert and oriented, walking outside his work and in no distress   [facility]2018 04/19/2018  Decreased Interest 0 0  Down, Depressed, Hopeless 0 0  PHQ - 2 Score 0 0  Altered sleeping 1 -  Tired, decreased energy 0 -  Change in appetite 0 -  Feeling bad or failure about yourself  0 -  Trouble concentrating 0 -  Moving slowly or fidgety/restless 0 -  Suicidal thoughts 0 -  PHQ-9 Score 1 -  Difficult doing work/chores Not difficult at all -   PHQ-2/9 Result is negative.    Fall Risk: Fall Risk  09/20/2018 04/19/2018  Falls in the past year? 0 0  Number falls in past yr: 0 0  Injury with Fall? 0 0     Assessment & Plan  1. Morbid obesity (HCC)  Discussed with the patient the risk posed by an increased BMI. Discussed importance of portion control, calorie counting and at least 150 minutes of physical activity weekly. Avoid sweet beverages and drink more water. Eat at least 6 servings of fruit and vegetables daily   2. Mild intermittent asthma without complication  Doing well at this time  3. Seasonal allergic rhinitis, unspecified trigger  - loratadine (CLARITIN) 10 MG tablet; Take 1 tablet (10 mg total) by mouth daily.  Dispense: 30 tablet; Refill: 3 - azelastine (ASTELIN) 0.1 % nasal spray; Place 2 sprays into both nostrils 2 (two) times daily. Use in each nostril as directed  Dispense: 30 mL; Refill: 2 - fluticasone (FLONASE) 50 MCG/ACT nasal spray; Place 2 sprays into both nostrils daily.  Dispense: 16 g; Refill: 2  4. Post-nasal drainage  Likely the cause of morning cough, adding nasal spray and loratadine, we will hold off on COVID-19 prior to referral to ENT   I discussed the assessment and treatment plan with the patient. The patient was provided an opportunity to ask  questions and all were answered. The patient agreed with the plan and demonstrated an understanding of the instructions.  The patient was advised to call back or seek an in-person evaluation if the symptoms worsen or if the condition fails to improve as anticipated.  I provided 25 minutes of non-face-to-face time during this encounter.

## 2019-01-28 ENCOUNTER — Other Ambulatory Visit: Payer: Self-pay | Admitting: Internal Medicine

## 2019-01-28 ENCOUNTER — Other Ambulatory Visit: Payer: Self-pay

## 2019-01-28 ENCOUNTER — Ambulatory Visit (INDEPENDENT_AMBULATORY_CARE_PROVIDER_SITE_OTHER): Payer: 59 | Admitting: Nurse Practitioner

## 2019-01-28 ENCOUNTER — Encounter: Payer: Self-pay | Admitting: Nurse Practitioner

## 2019-01-28 DIAGNOSIS — Z20822 Contact with and (suspected) exposure to covid-19: Secondary | ICD-10-CM

## 2019-01-28 DIAGNOSIS — Z20828 Contact with and (suspected) exposure to other viral communicable diseases: Secondary | ICD-10-CM

## 2019-01-28 DIAGNOSIS — Z7189 Other specified counseling: Secondary | ICD-10-CM | POA: Diagnosis not present

## 2019-01-28 NOTE — Progress Notes (Signed)
Virtual Visit via Video Note  I connected with [name] on 01/28/19 at  9:40 AM EDT by a video enabled telemedicine application and verified that I am speaking with the correct person using two identifiers.   Staff discussed the limitations of evaluation and management by telemedicine and the availability of in person appointments. The patient expressed understanding and agreed to proceed.  Patient location: home  My location: work office Other people present: none HPI  Patient lives with sister who tested positive yesterday for [phone]. Last contact with sister was today.  Denies cough, fever, shortness of breath, sense of smell or taste, diarrhea.    Has mild asthma, has not needed albuterol inhaler recently.     PHQ2/9: Depression screen [facility] 2/9 01/28/2019 09/20/2018 04/19/2018  Decreased Interest 0 0 0  Down, Depressed, Hopeless 0 0 0  PHQ - 2 Score 0 0 0  Altered sleeping 0 1 -  Tired, decreased energy 0 0 -  Change in appetite 0 0 -  Feeling bad or failure about yourself  0 0 -  Trouble concentrating 0 0 -  Moving slowly or fidgety/restless 0 0 -  Suicidal thoughts 0 0 -  PHQ-9 Score 0 1 -  Difficult doing work/chores Not difficult at all Not difficult at all -    PHQ reviewed. Negative  Patient Active Problem List   Diagnosis Date Noted  . Asthma, mild intermittent   . Morbid obesity ([place]) 02/08/2008    Past Medical History:  Diagnosis Date  . Asthma, mild intermittent   . Chronic constipation   . Morbid obesity ([place])     History reviewed. No pertinent surgical history.  Social History   Tobacco Use  . Smoking status: Never Smoker  . Smokeless tobacco: Never Used  Substance Use Topics  . Alcohol use: Yes    Alcohol/week: 1.0 standard drinks    Types: 1 Cans of beer per week     Current Outpatient Medications:  .  albuterol (PROVENTIL HFA;VENTOLIN HFA) 108 (90 Base) MCG/ACT inhaler, TAKE 2 PUFFS BY MOUTH EVERY 6 HOURS AS NEEDED FOR WHEEZE OR  SHORTNESS OF BREATH, Disp: 18 Inhaler, Rfl: 2 .  azelastine (ASTELIN) 0.1 % nasal spray, Place 2 sprays into both nostrils 2 (two) times daily. Use in each nostril as directed, Disp: 30 mL, Rfl: 2 .  fluticasone (FLONASE) 50 MCG/ACT nasal spray, Place 2 sprays into both nostrils daily., Disp: 16 g, Rfl: 2 .  loratadine (CLARITIN) 10 MG tablet, Take 1 tablet (10 mg total) by mouth daily., Disp: 30 tablet, Rfl: 3 .  montelukast (SINGULAIR) 10 MG tablet, Take 1 tablet (10 mg total) by mouth at bedtime., Disp: 90 tablet, Rfl: 0  No Known Allergies  ROS   No other specific complaints in a complete review of systems (except as listed in HPI above).  Objective  There were no vitals filed for this visit.   There is no height or weight on file to calculate BMI.  Nursing Note and Vital Signs reviewed.  Physical Exam   Constitutional: Patient appears well-developed and well-nourished. No distress.  HENT: Head: Normocephalic and atraumatic. Pulmonary/Chest: Effort normal  Neurological: alert and oriented, speech normal.  Skin: No rash noted. No erythema.  Psychiatric: Patient has a normal mood and affect. behavior is normal. Judgment and thought content normal.    Assessment & Plan  1. Exposure to Covid-19 Virus Will get testing  2. Educated About Covid-19 Virus Infection Discussed prevention and treatment  Follow Up Instructions:    I discussed the assessment and treatment plan with the patient. The patient was provided an opportunity to ask questions and all were answered. The patient agreed with the plan and demonstrated an understanding of the instructions.   The patient was advised to call back or seek an in-person evaluation if the symptoms worsen or if the condition fails to improve as anticipated.  I provided 8 minutes of non-face-to-face time during this encounter.   Cheryle HorsfallElizabeth E Lariyah Shetterly, NP

## 2019-01-28 NOTE — Patient Instructions (Signed)
-   please drive to the [place] [facility] building at [facility] for covid testing. Look for the white tent and stay in your car for testing.    For Fever/Pain: Acetaminophen every 6 hours as needed (maximum of 3000mg a day). If you are still uncomfortable you can add ibuprofen OR naproxen  For coughing: try dextromethorphan for a cough suppressant, and/or a cool mist humidifier, lozenges  For sore throat: saline gargles, honey herbal tea, lozenges, throat spray  To dry out your nose: try an antihistamine like loratadine (non-sedating) or diphenhydramine (sedating) or others To relieve a stuffy nose: try a decongestant like flonase or try the neti pot To make blowing your nose easier and relieve chest congestion: guaifenesin 400mg every 4-6 hours of guaifenesin ER 600-1200 mg every 12 hours. Do not take more than 2,400mg a day.   This information is directly available on the CDC website: https://www.cdc.gov/coronavirus/2019-ncov/if-you-are-sick/steps-when-sick.html    Source:CDC Reference to specific commercial products, manufacturers, companies, or trademarks does not constitute its endorsement or recommendation by the U.S. Government, Department of Health and Human Services, or Centers for Disease Control and Prevention.  

## 2019-01-30 ENCOUNTER — Encounter: Payer: Self-pay | Admitting: Family Medicine

## 2019-01-30 LAB — NOVEL CORONAVIRUS, NAA: SARS-CoV-2, NAA: NOT DETECTED

## 2019-02-01 ENCOUNTER — Encounter: Payer: Self-pay | Admitting: Family Medicine

## 2019-02-01 NOTE — Telephone Encounter (Signed)
Copied from [place] [id number]. Topic: Quick Communication - See Telephone Encounter >> Feb 01, 2019  8:56 AM Loma [place] wrote: CRM for notification. See Telephone encounter for: 02/01/19. Pt sister that lives in the same house with him tested positive COVID on Thursday, and he tested negative on Friday, wants a note or someone to reach out so he can get paid on his job [phone]

## 2019-02-05 ENCOUNTER — Encounter: Payer: Self-pay | Admitting: Emergency Medicine

## 2019-02-05 ENCOUNTER — Emergency Department: Payer: 59

## 2019-02-05 ENCOUNTER — Other Ambulatory Visit: Payer: Self-pay

## 2019-02-05 ENCOUNTER — Emergency Department
Admission: EM | Admit: 2019-02-05 | Discharge: 2019-02-05 | Disposition: A | Discharge status: Home / Self Care | Payer: 59 | Attending: Emergency Medicine | Admitting: Emergency Medicine

## 2019-02-05 DIAGNOSIS — J4520 Mild intermittent asthma, uncomplicated: Secondary | ICD-10-CM | POA: Insufficient documentation

## 2019-02-05 DIAGNOSIS — Z20822 Contact with and (suspected) exposure to covid-19: Secondary | ICD-10-CM

## 2019-02-05 DIAGNOSIS — Z20828 Contact with and (suspected) exposure to other viral communicable diseases: Secondary | ICD-10-CM

## 2019-02-05 DIAGNOSIS — U071 COVID-19: Secondary | ICD-10-CM | POA: Insufficient documentation

## 2019-02-05 DIAGNOSIS — R05 Cough: Secondary | ICD-10-CM | POA: Diagnosis present

## 2019-02-05 DIAGNOSIS — J069 Acute upper respiratory infection, unspecified: Secondary | ICD-10-CM

## 2019-02-05 LAB — COMPREHENSIVE METABOLIC PANEL
ALT: 82 U/L — ABNORMAL HIGH (ref 0–44)
AST: 70 U/L — ABNORMAL HIGH (ref 15–41)
Albumin: 3.8 g/dL (ref 3.5–5.0)
Alkaline Phosphatase: 69 U/L (ref 38–126)
Anion gap: 11 (ref 5–15)
BUN: 13 mg/dL (ref 6–20)
CO2: 24 mmol/L (ref 22–32)
Calcium: 8.8 mg/dL — ABNORMAL LOW (ref 8.9–10.3)
Chloride: 104 mmol/L (ref 98–111)
Creatinine, Ser: 1.15 mg/dL (ref 0.61–1.24)
GFR calc Af Amer: >60 mL/min (ref >60)
GFR calc non Af Amer: >60 mL/min (ref >60)
Glucose, Bld: 120 mg/dL — ABNORMAL HIGH (ref 70–99)
Potassium: 3.7 mmol/L (ref 3.5–5.1)
Sodium: 139 mmol/L (ref 135–145)
Total Bilirubin: 1.1 mg/dL (ref 0.3–1.2)
Total Protein: 7.8 g/dL (ref 6.5–8.1)

## 2019-02-05 LAB — URINALYSIS, COMPLETE (UACMP) WITH MICROSCOPIC
Bacteria, UA: NONE SEEN
Bilirubin Urine: NEGATIVE
Glucose, UA: NEGATIVE mg/dL
Hgb urine dipstick: NEGATIVE
Ketones, ur: 5 mg/dL — AB
Leukocytes,Ua: NEGATIVE
Nitrite: NEGATIVE
Protein, ur: NEGATIVE mg/dL
Specific Gravity, Urine: 1.017 (ref 1.005–1.030)
pH: 6 (ref 5.0–8.0)

## 2019-02-05 LAB — CBC WITH DIFFERENTIAL/PLATELET
Abs Immature Granulocytes: 0.02 10*3/uL (ref 0.00–0.07)
Basophils Absolute: 0 10*3/uL (ref 0.0–0.1)
Basophils Relative: 0 %
Eosinophils Absolute: 0 10*3/uL (ref 0.0–0.5)
Eosinophils Relative: 0 %
HCT: 39 % (ref 39.0–52.0)
Hemoglobin: 12.6 g/dL — ABNORMAL LOW (ref 13.0–17.0)
Immature Granulocytes: 1 %
Lymphocytes Relative: 29 %
Lymphs Abs: 0.7 10*3/uL (ref 0.7–4.0)
MCH: 23.3 pg — ABNORMAL LOW (ref 26.0–34.0)
MCHC: 32.3 g/dL (ref 30.0–36.0)
MCV: 72.1 fL — ABNORMAL LOW (ref 80.0–100.0)
Monocytes Absolute: 0.2 10*3/uL (ref 0.1–1.0)
Monocytes Relative: 9 %
Neutro Abs: 1.6 10*3/uL — ABNORMAL LOW (ref 1.7–7.7)
Neutrophils Relative %: 61 %
Platelets: 217 10*3/uL (ref 150–400)
RBC: 5.41 MIL/uL (ref 4.22–5.81)
RDW: 14.2 % (ref 11.5–15.5)
WBC: 2.6 10*3/uL — ABNORMAL LOW (ref 4.0–10.5)
nRBC: 0 % (ref 0.0–0.2)

## 2019-02-05 LAB — LIPASE, BLOOD: Lipase: 19 U/L (ref 11–51)

## 2019-02-05 LAB — SARS CORONAVIRUS 2 BY RT PCR (HOSPITAL ORDER, PERFORMED IN ~~LOC~~ HOSPITAL LAB): SARS Coronavirus 2: POSITIVE — AB

## 2019-02-05 MED ORDER — PREDNISONE 10 MG PO TABS: ORAL_TABLET | ORAL | 0 refills | Status: DC

## 2019-02-05 MED ORDER — AZITHROMYCIN 250 MG PO TABS: ORAL_TABLET | ORAL | 0 refills | Status: DC

## 2019-02-05 MED ORDER — SODIUM CHLORIDE 0.9 % IV BOLUS
1000.0000 mL | Freq: Once | INTRAVENOUS | Status: AC
  Administered 2019-02-05: 09:00:00 1000 mL via INTRAVENOUS

## 2019-02-05 NOTE — Discharge Instructions (Signed)
Read the information about COVID and self quarantine for the next 10 days with anyone that you are living with.  Begin taking medication as directed.  The Zithromax is an antibiotic and prednisone should help with cough.  Increase fluids and drink frequently.  You may take Tylenol if needed for headache, body aches or fever.  A note for work was written.

## 2019-02-05 NOTE — ED Notes (Signed)
Pt st that he has been having a nagging cough for over a week. St that his sister tested positive for COVID-19 and currently his mother is being treated for similar sx. Pt st that he and his mother tested negative. Pt st that he has not had any additional sx, no c/o fever/chills/body aches,  ect. Pt in NAD at this time

## 2019-02-05 NOTE — ED Notes (Signed)
Lab result called to this RN, +Covid swab result received Pt notified of result

## 2019-02-05 NOTE — ED Triage Notes (Signed)
Patient states that he thinks that he is dehydrated. Patient states that he has had decreased urine output and his urine has been dark over the last 2 days. Patient states that he also has had cough time one week.

## 2019-02-05 NOTE — ED Notes (Signed)
Pt took all personal belongings from the room prior to leaving the ED

## 2019-02-05 NOTE — ED Provider Notes (Signed)
[facility] Emergency Department Provider Note  ____________________________________________   First MD Initiated Contact with Patient 02/05/19 [phone]     (approximate)  I have reviewed the triage vital signs and the nursing notes.   HISTORY  Chief Complaint Dehydration and Cough   HPI [name] is a 28 y.o. male presents to the ED with complaint of minimal productive cough for the last week.  Patient is unaware of any fever and denies chills.  He is unaware of any changes in his sense of smell or taste.  Patient does relate that he lives with his sister and she tested positive for COVID.  He also believes that he is dehydrated as he has had decreased urine output and that his urine has been dark over the last 2 days.  Patient is non-smoker.  He states that he drinks 1 beer occasionally per month.      Past Medical History:  Diagnosis Date  . Asthma, mild intermittent   . Chronic constipation   . Morbid obesity [facility])     Patient Active Problem List   Diagnosis Date Noted  . Asthma, mild intermittent   . Morbid obesity ([place]) 02/08/2008    History reviewed. No pertinent surgical history.  Prior to Admission medications   Medication Sig Start Date End Date Taking? Authorizing Provider  albuterol (PROVENTIL HFA;VENTOLIN HFA) 108 (90 Base) MCG/ACT inhaler TAKE 2 PUFFS BY MOUTH EVERY 6 HOURS AS NEEDED FOR WHEEZE OR SHORTNESS OF BREATH 05/18/18   Ancil Boozer, Drue Stager, MD  azelastine (ASTELIN) 0.1 % nasal spray Place 2 sprays into both nostrils 2 (two) times daily. Use in each nostril as directed 09/20/18   Steele Sizer, MD  azithromycin (ZITHROMAX Z-PAK) 250 MG tablet Take 2 tablets (500 mg) on  Day 1,  followed by 1 tablet (250 mg) once daily on Days 2 through 5. 02/05/19   Johnn Hai, PA-C  fluticasone (FLONASE) 50 MCG/ACT nasal spray Place 2 sprays into both nostrils daily. 09/20/18   Steele Sizer, MD  loratadine (CLARITIN) 10 MG tablet Take 1  tablet (10 mg total) by mouth daily. 09/20/18   Steele Sizer, MD  montelukast (SINGULAIR) 10 MG tablet Take 1 tablet (10 mg total) by mouth at bedtime. 04/19/18   Steele Sizer, MD  predniSONE (DELTASONE) 10 MG tablet Take 6 tablets  today, on day 2 take 5 tablets, day 3 take 4 tablets, day 4 take 3 tablets, day 5 take  2 tablets and 1 tablet the last day 02/05/19   Johnn Hai, PA-C    Allergies Patient has no known allergies.  Family History  Problem Relation Age of Onset  . Obesity Mother   . Kidney disease Father        mass  . Obesity Sister     Social History Social History   Tobacco Use  . Smoking status: Never Smoker  . Smokeless tobacco: Never Used  Substance Use Topics  . Alcohol use: Yes  . Drug use: Never    Review of Systems Constitutional: No fever/chills Eyes: No visual changes. ENT: No sore throat. Cardiovascular: Denies chest pain. Respiratory: Denies shortness of breath.  Positive for cough. Gastrointestinal: No abdominal pain.  No nausea, no vomiting.  No diarrhea.  Genitourinary: Decreased urination. Musculoskeletal: Negative for back pain. Skin: Negative for rash. Neurological: Negative for headaches, focal weakness or numbness. ___________________________________________   PHYSICAL EXAM:  VITAL SIGNS: ED Triage Vitals [02/05/19 0624]  Enc Vitals Group  BP 124/65     Pulse Rate (!) 121     Resp 20     Temp 99.2 F (37.3 C)     Temp Source Oral     SpO2 94 %     Weight (!) 440 lb (199.6 kg)     Height 6\' 4"  (1.93 m)     Head Circumference      Peak Flow      Pain Score 0     Pain Loc      Pain Edu?      Excl. in GC?    Constitutional: Alert and oriented. Well appearing and in no acute distress. Eyes: Conjunctivae are normal.  Head: Atraumatic. Neck: No stridor.   Hematological/Lymphatic/Immunilogical: No cervical lymphadenopathy. Cardiovascular: Normal rate, regular rhythm. Grossly normal heart sounds.  Good peripheral  circulation. Respiratory: Normal respiratory effort.  No retractions. Lungs CTAB. Gastrointestinal: Soft and nontender. No distention. Musculoskeletal: Moves upper and lower extremities without any difficulty.  Normal gait was noted and patient was ambulatory without any assistance. Neurologic:  Normal speech and language. No gross focal neurologic deficits are appreciated. No gait instability. Skin:  Skin is warm, dry and intact.  Psychiatric: Mood and affect are normal. Speech and behavior are normal.  ____________________________________________   LABS (all labs ordered are listed, but only abnormal results are displayed)  Labs Reviewed  SARS CORONAVIRUS 2 (HOSPITAL ORDER, PERFORMED IN [facility]) - Abnormal; Notable for the following components:      Result Value   SARS Coronavirus 2 POSITIVE (*)    All other components within normal limits  CBC WITH DIFFERENTIAL/PLATELET - Abnormal; Notable for the following components:   WBC 2.6 (*)    Hemoglobin 12.6 (*)    MCV 72.1 (*)    MCH 23.3 (*)    Neutro Abs 1.6 (*)    All other components within normal limits  COMPREHENSIVE METABOLIC PANEL - Abnormal; Notable for the following components:   Glucose, Bld 120 (*)    Calcium 8.8 (*)    AST 70 (*)    ALT 82 (*)    All other components within normal limits  URINALYSIS, COMPLETE (UACMP) WITH MICROSCOPIC - Abnormal; Notable for the following components:   Color, Urine YELLOW (*)    APPearance HAZY (*)    Ketones, ur 5 (*)    All other components within normal limits  LIPASE, BLOOD   ____________________________________________  RADIOLOGY  Official radiology report(s): Dg Chest Portable 1 View  Result Date: 02/05/2019 CLINICAL DATA:  Cough 1 week which shortness-of-breath and decreased urine output. EXAM: PORTABLE CHEST 1 VIEW COMPARISON:  12/08/2014 FINDINGS: Lungs are adequately inflated with mild patchy bilateral opacification. No effusion. Cardiomediastinal  silhouette and remainder of the exam is unchanged. IMPRESSION: Mild patchy bilateral airspace opacification concerning for infection. Electronically Signed   By: Elberta Fortis M.D.   On: 02/05/2019 08:43    ____________________________________________   PROCEDURES  Procedure(s) performed (including Critical Care):  Procedures   ____________________________________________   INITIAL IMPRESSION / ASSESSMENT AND PLAN / ED COURSE  As part of my medical decision making, I reviewed the following data within the electronic MEDICAL RECORD NUMBER Notes from prior ED visits and [place] Controlled Substance Database  [name] was evaluated in Emergency Department on 02/05/2019 for the symptoms described in the history of present illness. He was evaluated in the context of the global COVID-19 pandemic, which necessitated consideration that the patient might be at risk for infection with  the SARS-CoV-2 virus that causes COVID-19. Institutional protocols and algorithms that pertain to the evaluation of patients at risk for COVID-19 are in a state of rapid change based on information released by regulatory bodies including the CDC and federal and state organizations. These policies and algorithms were followed during the patient's care in the ED.  28 year old male presents to the ED with complaint of a cough for 1 week.  Patient is unaware of any fever or chills.  He denies any change in appetite, smell or taste.  He states that he has been trying to stay hydrated but feels dehydrated despite drinking water and Gatorade.  Patient denies any GI symptoms.  He states that he lives at home with his mother and sister and that his sister tested positive for COVID and that his mother is being seen today for the same reasons.  Chest x-ray was consistent with a early pneumonia and patient was made aware.  He was discharged on a prescription for Zithromax and prednisone.  He was told to return immediately to the ED if he  developed any shortness of breath or difficulty breathing.  ____________________________________________   FINAL CLINICAL IMPRESSION(S) / ED DIAGNOSES  Final diagnoses:  Viral URI with cough  Close Exposure to Covid-19 Virus     ED Discharge Orders         Ordered    azithromycin (ZITHROMAX Z-PAK) 250 MG tablet     02/05/19 0952    predniSONE (DELTASONE) 10 MG tablet     02/05/19 [id number]           Note:  This document was prepared using Dragon voice recognition software and may include unintentional dictation errors.    Tommi RumpsSummers, Rhonda L, PA-C 02/05/19 1502    Minna AntisPaduchowski, Kevin, MD 02/06/19 1419

## 2019-02-07 ENCOUNTER — Encounter (HOSPITAL_COMMUNITY): Payer: Self-pay

## 2019-02-07 ENCOUNTER — Encounter: Payer: Self-pay | Admitting: Emergency Medicine

## 2019-02-07 ENCOUNTER — Other Ambulatory Visit: Payer: Self-pay

## 2019-02-07 ENCOUNTER — Emergency Department
Admission: EM | Admit: 2019-02-07 | Discharge: 2019-02-07 | Disposition: A | Discharge status: Other Acute Inpatient Hospital | Payer: 59 | Attending: Emergency Medicine | Admitting: Emergency Medicine

## 2019-02-07 ENCOUNTER — Inpatient Hospital Stay (HOSPITAL_COMMUNITY)
Admission: AD | Admit: 2019-02-07 | Discharge: 2019-02-11 | DRG: 177 | Disposition: A | Discharge status: Home / Self Care | Payer: 59 | Source: Other Acute Inpatient Hospital | Attending: Internal Medicine | Admitting: Internal Medicine

## 2019-02-07 ENCOUNTER — Emergency Department: Payer: 59

## 2019-02-07 DIAGNOSIS — R739 Hyperglycemia, unspecified: Secondary | ICD-10-CM | POA: Diagnosis present

## 2019-02-07 DIAGNOSIS — R740 Nonspecific elevation of levels of transaminase and lactic acid dehydrogenase [LDH]: Secondary | ICD-10-CM | POA: Diagnosis not present

## 2019-02-07 DIAGNOSIS — E861 Hypovolemia: Secondary | ICD-10-CM | POA: Diagnosis present

## 2019-02-07 DIAGNOSIS — Z800 Family history of malignant neoplasm of digestive organs: Secondary | ICD-10-CM | POA: Diagnosis not present

## 2019-02-07 DIAGNOSIS — J4520 Mild intermittent asthma, uncomplicated: Secondary | ICD-10-CM | POA: Diagnosis present

## 2019-02-07 DIAGNOSIS — K5909 Other constipation: Secondary | ICD-10-CM | POA: Diagnosis present

## 2019-02-07 DIAGNOSIS — J1289 Other viral pneumonia: Secondary | ICD-10-CM | POA: Insufficient documentation

## 2019-02-07 DIAGNOSIS — Y92239 Unspecified place in hospital as the place of occurrence of the external cause: Secondary | ICD-10-CM | POA: Diagnosis not present

## 2019-02-07 DIAGNOSIS — J189 Pneumonia, unspecified organism: Secondary | ICD-10-CM

## 2019-02-07 DIAGNOSIS — R0902 Hypoxemia: Secondary | ICD-10-CM | POA: Insufficient documentation

## 2019-02-07 DIAGNOSIS — Z713 Dietary counseling and surveillance: Secondary | ICD-10-CM | POA: Diagnosis not present

## 2019-02-07 DIAGNOSIS — J9601 Acute respiratory failure with hypoxia: Secondary | ICD-10-CM

## 2019-02-07 DIAGNOSIS — I951 Orthostatic hypotension: Secondary | ICD-10-CM | POA: Diagnosis present

## 2019-02-07 DIAGNOSIS — T380X5A Adverse effect of glucocorticoids and synthetic analogues, initial encounter: Secondary | ICD-10-CM | POA: Diagnosis present

## 2019-02-07 DIAGNOSIS — Z23 Encounter for immunization: Secondary | ICD-10-CM

## 2019-02-07 DIAGNOSIS — Z6841 Body Mass Index (BMI) 40.0 and over, adult: Secondary | ICD-10-CM

## 2019-02-07 DIAGNOSIS — U071 COVID-19: Principal | ICD-10-CM | POA: Diagnosis present

## 2019-02-07 DIAGNOSIS — R0602 Shortness of breath: Secondary | ICD-10-CM | POA: Diagnosis present

## 2019-02-07 DIAGNOSIS — Z79899 Other long term (current) drug therapy: Secondary | ICD-10-CM | POA: Insufficient documentation

## 2019-02-07 DIAGNOSIS — Z841 Family history of disorders of kidney and ureter: Secondary | ICD-10-CM

## 2019-02-07 DIAGNOSIS — D509 Iron deficiency anemia, unspecified: Secondary | ICD-10-CM | POA: Diagnosis present

## 2019-02-07 DIAGNOSIS — Z791 Long term (current) use of non-steroidal anti-inflammatories (NSAID): Secondary | ICD-10-CM

## 2019-02-07 LAB — HEPATIC FUNCTION PANEL
ALT: 55 U/L — ABNORMAL HIGH (ref 0–44)
AST: 66 U/L — ABNORMAL HIGH (ref 15–41)
Albumin: 3.3 g/dL — ABNORMAL LOW (ref 3.5–5.0)
Alkaline Phosphatase: 51 U/L (ref 38–126)
Bilirubin, Direct: 0.3 mg/dL — ABNORMAL HIGH (ref 0.0–0.2)
Indirect Bilirubin: 0.8 mg/dL (ref 0.3–0.9)
Total Bilirubin: 1.1 mg/dL (ref 0.3–1.2)
Total Protein: 7.1 g/dL (ref 6.5–8.1)

## 2019-02-07 LAB — CBC WITH DIFFERENTIAL/PLATELET
Abs Immature Granulocytes: 0.02 10*3/uL (ref 0.00–0.07)
Basophils Absolute: 0 10*3/uL (ref 0.0–0.1)
Basophils Relative: 0 %
Eosinophils Absolute: 0 10*3/uL (ref 0.0–0.5)
Eosinophils Relative: 0 %
HCT: 39 % (ref 39.0–52.0)
Hemoglobin: 12.6 g/dL — ABNORMAL LOW (ref 13.0–17.0)
Immature Granulocytes: 0 %
Lymphocytes Relative: 12 %
Lymphs Abs: 0.7 10*3/uL (ref 0.7–4.0)
MCH: 23.3 pg — ABNORMAL LOW (ref 26.0–34.0)
MCHC: 32.3 g/dL (ref 30.0–36.0)
MCV: 72.2 fL — ABNORMAL LOW (ref 80.0–100.0)
Monocytes Absolute: 0.2 10*3/uL (ref 0.1–1.0)
Monocytes Relative: 4 %
Neutro Abs: 5.3 10*3/uL (ref 1.7–7.7)
Neutrophils Relative %: 84 %
Platelets: 237 10*3/uL (ref 150–400)
RBC: 5.4 MIL/uL (ref 4.22–5.81)
RDW: 14.2 % (ref 11.5–15.5)
WBC: 6.2 10*3/uL (ref 4.0–10.5)
nRBC: 0 % (ref 0.0–0.2)

## 2019-02-07 LAB — BASIC METABOLIC PANEL
Anion gap: 12 (ref 5–15)
BUN: 12 mg/dL (ref 6–20)
CO2: 22 mmol/L (ref 22–32)
Calcium: 8.3 mg/dL — ABNORMAL LOW (ref 8.9–10.3)
Chloride: 104 mmol/L (ref 98–111)
Creatinine, Ser: 1.07 mg/dL (ref 0.61–1.24)
GFR calc Af Amer: >60 mL/min (ref >60)
GFR calc non Af Amer: >60 mL/min (ref >60)
Glucose, Bld: 133 mg/dL — ABNORMAL HIGH (ref 70–99)
Potassium: 3.5 mmol/L (ref 3.5–5.1)
Sodium: 138 mmol/L (ref 135–145)

## 2019-02-07 LAB — FIBRIN DERIVATIVES D-DIMER (ARMC ONLY): Fibrin derivatives D-dimer (ARMC): 2257.99 ng/mL (FEU) — ABNORMAL HIGH (ref 0.00–499.00)

## 2019-02-07 LAB — GLUCOSE, CAPILLARY
Glucose-Capillary: 117 mg/dL — ABNORMAL HIGH (ref 70–99)
Glucose-Capillary: 135 mg/dL — ABNORMAL HIGH (ref 70–99)

## 2019-02-07 LAB — ABO/RH: ABO/RH(D): B POS

## 2019-02-07 LAB — BRAIN NATRIURETIC PEPTIDE: B Natriuretic Peptide: 8 pg/mL (ref 0.0–100.0)

## 2019-02-07 LAB — FERRITIN
Ferritin: 200 ng/mL (ref 24–336)
Ferritin: 541 ng/mL — ABNORMAL HIGH (ref 24–336)

## 2019-02-07 LAB — PROCALCITONIN
Procalcitonin: <0.1 ng/mL
Procalcitonin: <0.1 ng/mL

## 2019-02-07 LAB — D-DIMER, QUANTITATIVE: D-Dimer, Quant: 0.51 ug/mL-FEU — ABNORMAL HIGH (ref 0.00–0.50)

## 2019-02-07 LAB — TROPONIN I (HIGH SENSITIVITY): Troponin I (High Sensitivity): 8 ng/L (ref <18)

## 2019-02-07 LAB — C-REACTIVE PROTEIN
CRP: 3 mg/dL — ABNORMAL HIGH (ref <1.0)
CRP: <0.8 mg/dL (ref <1.0)

## 2019-02-07 MED ORDER — INSULIN ASPART 100 UNIT/ML ~~LOC~~ SOLN: 0.0000 [IU] | Freq: Three times a day (TID) | SUBCUTANEOUS | Status: DC

## 2019-02-07 MED ORDER — ALBUTEROL SULFATE HFA 108 (90 BASE) MCG/ACT IN AERS
2.0000 | INHALATION_SPRAY | RESPIRATORY_TRACT | Status: DC
  Administered 2019-02-07 – 2019-02-11 (×24): 2 via RESPIRATORY_TRACT
  Filled 2019-02-07: qty 6.7

## 2019-02-07 MED ORDER — ONDANSETRON HCL 4 MG/2ML IJ SOLN
4.0000 mg | Freq: Once | INTRAMUSCULAR | Status: AC
  Administered 2019-02-07: 08:00:00 4 mg via INTRAVENOUS

## 2019-02-07 MED ORDER — MONTELUKAST SODIUM 10 MG PO TABS
10.0000 mg | ORAL_TABLET | Freq: Every day | ORAL | Status: DC
  Administered 2019-02-07 – 2019-02-10 (×4): 10 mg via ORAL
  Filled 2019-02-07 (×4): qty 1

## 2019-02-07 MED ORDER — ENOXAPARIN SODIUM 100 MG/ML ~~LOC~~ SOLN
100.0000 mg | SUBCUTANEOUS | Status: DC
  Administered 2019-02-07 – 2019-02-10 (×4): 100 mg via SUBCUTANEOUS
  Filled 2019-02-07 (×6): qty 1

## 2019-02-07 MED ORDER — FLUTICASONE PROPIONATE 50 MCG/ACT NA SUSP
2.0000 | Freq: Every day | NASAL | Status: DC
  Administered 2019-02-07 – 2019-02-11 (×5): 2 via NASAL
  Filled 2019-02-07: qty 16

## 2019-02-07 MED ORDER — PNEUMOCOCCAL VAC POLYVALENT 25 MCG/0.5ML IJ INJ
0.5000 mL | INJECTION | INTRAMUSCULAR | Status: AC
  Administered 2019-02-10: 0.5 mL via INTRAMUSCULAR
  Filled 2019-02-07: qty 0.5

## 2019-02-07 MED ORDER — SODIUM CHLORIDE 0.9 % IV BOLUS
1000.0000 mL | Freq: Once | INTRAVENOUS | Status: AC
  Administered 2019-02-07: 1000 mL via INTRAVENOUS

## 2019-02-07 MED ORDER — VITAMIN C 500 MG PO TABS
500.0000 mg | ORAL_TABLET | Freq: Every day | ORAL | Status: DC
  Administered 2019-02-07 – 2019-02-11 (×5): 500 mg via ORAL
  Filled 2019-02-07 (×5): qty 1

## 2019-02-07 MED ORDER — INSULIN ASPART 100 UNIT/ML ~~LOC~~ SOLN
6.0000 [IU] | Freq: Three times a day (TID) | SUBCUTANEOUS | Status: DC
  Administered 2019-02-07 – 2019-02-09 (×5): 6 [IU] via SUBCUTANEOUS

## 2019-02-07 MED ORDER — SODIUM CHLORIDE 0.9 % IV SOLN
100.0000 mg | INTRAVENOUS | Status: AC
  Administered 2019-02-08 – 2019-02-11 (×4): 100 mg via INTRAVENOUS
  Filled 2019-02-07 (×4): qty 20

## 2019-02-07 MED ORDER — ZINC SULFATE 220 (50 ZN) MG PO CAPS
220.0000 mg | ORAL_CAPSULE | Freq: Every day | ORAL | Status: DC
  Administered 2019-02-07 – 2019-02-11 (×5): 220 mg via ORAL
  Filled 2019-02-07 (×5): qty 1

## 2019-02-07 MED ORDER — SODIUM CHLORIDE 0.9 % IV SOLN: 250.0000 mL | INTRAVENOUS | Status: DC | PRN

## 2019-02-07 MED ORDER — INFLUENZA VAC SPLIT QUAD 0.5 ML IM SUSY
0.5000 mL | PREFILLED_SYRINGE | INTRAMUSCULAR | Status: AC
  Administered 2019-02-10: 0.5 mL via INTRAMUSCULAR
  Filled 2019-02-07: qty 0.5

## 2019-02-07 MED ORDER — SODIUM CHLORIDE 0.9 % IV SOLN
INTRAVENOUS | Status: DC
  Administered 2019-02-07 – 2019-02-08 (×2): via INTRAVENOUS

## 2019-02-07 MED ORDER — DEXAMETHASONE SODIUM PHOSPHATE 10 MG/ML IJ SOLN
10.0000 mg | Freq: Once | INTRAMUSCULAR | Status: AC
  Administered 2019-02-07: 08:00:00 10 mg via INTRAVENOUS
  Filled 2019-02-07: qty 1

## 2019-02-07 MED ORDER — SODIUM CHLORIDE 0.9% FLUSH: 3.0000 mL | INTRAVENOUS | Status: DC | PRN

## 2019-02-07 MED ORDER — ONDANSETRON HCL 4 MG/2ML IJ SOLN
INTRAMUSCULAR | Status: AC
  Administered 2019-02-07: 08:00:00 4 mg via INTRAVENOUS
  Filled 2019-02-07: qty 2

## 2019-02-07 MED ORDER — SODIUM CHLORIDE 0.9 % IV SOLN
200.0000 mg | Freq: Once | INTRAVENOUS | Status: AC
  Administered 2019-02-07: 16:00:00 200 mg via INTRAVENOUS
  Filled 2019-02-07: qty 40

## 2019-02-07 MED ORDER — IPRATROPIUM-ALBUTEROL 0.5-2.5 (3) MG/3ML IN SOLN
3.0000 mL | RESPIRATORY_TRACT | Status: DC | PRN
  Administered 2019-02-07: 3 mL via RESPIRATORY_TRACT
  Filled 2019-02-07: qty 3

## 2019-02-07 MED ORDER — DEXAMETHASONE 6 MG PO TABS
6.0000 mg | ORAL_TABLET | Freq: Two times a day (BID) | ORAL | Status: DC
  Administered 2019-02-07 – 2019-02-09 (×5): 6 mg via ORAL
  Filled 2019-02-07 (×5): qty 1

## 2019-02-07 MED ORDER — INSULIN ASPART 100 UNIT/ML ~~LOC~~ SOLN: 0.0000 [IU] | Freq: Every day | SUBCUTANEOUS | Status: DC

## 2019-02-07 NOTE — ED Notes (Signed)
Patient given TV remote and updated on plan of care. No further needs expressed at this time.

## 2019-02-07 NOTE — ED Notes (Signed)
[facility] at bedside to transport patient.  

## 2019-02-07 NOTE — ED Notes (Signed)
Patient given sandwich tray. Updated on ETA of ambulance. No further needs expressed at this time.

## 2019-02-07 NOTE — ED Provider Notes (Signed)
[facility] Emergency Department Provider Note   ____________________________________________   First MD Initiated Contact with Patient 02/07/19 [phone]     (approximate)  I have reviewed the triage vital signs and the nursing notes.   HISTORY  Chief Complaint Shortness of Breath    HPI [name] is a 28 y.o. male with past medical history of childhood asthma who presents to the ED complaining of shortness of breath.  Patient reports he initially developed a cough about a week and a half ago after being exposed to his sister who had COVID.  He has additionally had some diffuse body aches, fevers and chills.  He was seen here 2 days ago, at which time COVID testing was positive.  He states he was doing well yesterday, but overnight developed worsening shortness of breath.  When he went to stand up out of bed this morning, he states he passed out and fell back onto his bed.  He denies hitting his head or suffering any traumatic injury.  He denies any pain beyond his ongoing muscle aches.  He has not had any chest pain with his symptoms, and denies any pain or swelling in his legs.        Past Medical History:  Diagnosis Date  . Asthma, mild intermittent   . Chronic constipation   . Morbid obesity [facility])     Patient Active Problem List   Diagnosis Date Noted  . Asthma, mild intermittent   . Morbid obesity (HCC) 02/08/2008    History reviewed. No pertinent surgical history.  Prior to Admission medications   Medication Sig Start Date End Date Taking? Authorizing Provider  albuterol (PROVENTIL HFA;VENTOLIN HFA) 108 (90 Base) MCG/ACT inhaler TAKE 2 PUFFS BY MOUTH EVERY 6 HOURS AS NEEDED FOR WHEEZE OR SHORTNESS OF BREATH 05/18/18   Carlynn Purl, Danna Hefty, MD  azelastine (ASTELIN) 0.1 % nasal spray Place 2 sprays into both nostrils 2 (two) times daily. Use in each nostril as directed 09/20/18   Alba Cory, MD  azithromycin (ZITHROMAX Z-PAK) 250 MG tablet Take  2 tablets (500 mg) on  Day 1,  followed by 1 tablet (250 mg) once daily on Days 2 through 5. 02/05/19   Tommi Rumps, PA-C  fluticasone (FLONASE) 50 MCG/ACT nasal spray Place 2 sprays into both nostrils daily. 09/20/18   Alba Cory, MD  loratadine (CLARITIN) 10 MG tablet Take 1 tablet (10 mg total) by mouth daily. 09/20/18   Alba Cory, MD  montelukast (SINGULAIR) 10 MG tablet Take 1 tablet (10 mg total) by mouth at bedtime. 04/19/18   Alba Cory, MD  predniSONE (DELTASONE) 10 MG tablet Take 6 tablets  today, on day 2 take 5 tablets, day 3 take 4 tablets, day 4 take 3 tablets, day 5 take  2 tablets and 1 tablet the last day 02/05/19   Tommi Rumps, PA-C    Allergies Patient has no known allergies.  Family History  Problem Relation Age of Onset  . Obesity Mother   . Kidney disease Father        mass  . Obesity Sister     Social History Social History   Tobacco Use  . Smoking status: Never Smoker  . Smokeless tobacco: Never Used  Substance Use Topics  . Alcohol use: Yes  . Drug use: Never    Review of Systems  Constitutional: Positive for fevers and chills. Eyes: No visual changes. ENT: No sore throat. Cardiovascular: Denies chest pain.  Positive for syncope.  Respiratory: Positive for shortness of breath. Gastrointestinal: No abdominal pain.  Positive for nausea, no vomiting.  No diarrhea.  No constipation. Genitourinary: Negative for dysuria. Musculoskeletal: Negative for back pain.  Positive for myalgias. Skin: Negative for rash. Neurological: Negative for headaches, focal weakness or numbness.  ____________________________________________   PHYSICAL EXAM:  VITAL SIGNS: ED Triage Vitals  Enc Vitals Group     BP 02/07/19 0727 122/81     Pulse Rate 02/07/19 0727 (!) 118     Resp 02/07/19 0727 20     Temp 02/07/19 0727 100.2 F (37.9 C)     Temp Source 02/07/19 0727 Oral     SpO2 02/07/19 0727 (!) 87 %     Weight 02/07/19 0728 (!) 440 lb  (199.6 kg)     Height 02/07/19 0728 6\' 4"  (1.93 m)     Head Circumference --      Peak Flow --      Pain Score 02/07/19 0728 5     Pain Loc --      Pain Edu? --      Excl. in GC? --     Constitutional: Alert and oriented. Eyes: Conjunctivae are normal. Head: Atraumatic. Nose: No congestion/rhinnorhea. Mouth/Throat: Mucous membranes are moist. Neck: Normal ROM Cardiovascular: Tachycardic, regular rhythm. Grossly normal heart sounds. Respiratory: Normal respiratory effort.  No retractions. Lungs CTAB. Gastrointestinal: Soft and nontender. No distention. Genitourinary: deferred Musculoskeletal: No lower extremity tenderness nor edema. Neurologic:  Normal speech and language. No gross focal neurologic deficits are appreciated. Skin:  Skin is warm, dry and intact. No rash noted. Psychiatric: Mood and affect are normal. Speech and behavior are normal.  ____________________________________________   LABS (all labs ordered are listed, but only abnormal results are displayed)  Labs Reviewed  CBC WITH DIFFERENTIAL/PLATELET - Abnormal; Notable for the following components:      Result Value   Hemoglobin 12.6 (*)    MCV 72.2 (*)    MCH 23.3 (*)    All other components within normal limits  BASIC METABOLIC PANEL - Abnormal; Notable for the following components:   Glucose, Bld 133 (*)    Calcium 8.3 (*)    All other components within normal limits  HEPATIC FUNCTION PANEL - Abnormal; Notable for the following components:   Albumin 3.3 (*)    AST 66 (*)    ALT 55 (*)    Bilirubin, Direct 0.3 (*)    All other components within normal limits  C-REACTIVE PROTEIN - Abnormal; Notable for the following components:   CRP 3.0 (*)    All other components within normal limits  FIBRIN DERIVATIVES D-DIMER ([facility] ONLY) - Abnormal; Notable for the following components:   Fibrin derivatives D-dimer ([facility]) 2,257.99 (*)    All other components within normal limits  BRAIN NATRIURETIC PEPTIDE   FERRITIN  PROCALCITONIN  TROPONIN I (HIGH SENSITIVITY)   ____________________________________________  EKG  ED ECG REPORT I, Chesley Noonharles Olie Dibert, the attending physician, personally viewed and interpreted this ECG.   Date: 02/07/2019  EKG Time: 8:02  Rate: 115  Rhythm: sinus tachycardia  Axis: Normal  Intervals:none  ST&T Change: None    PROCEDURES  Procedure(s) performed (including Critical Care):  .Critical Care Performed by: Chesley NoonJessup, Dayten Juba, MD Authorized by: Chesley NoonJessup, Kassi Esteve, MD   Critical care provider statement:    Critical care time (minutes):  45   Critical care time was exclusive of:  Separately billable procedures and treating other patients and teaching time   Critical care was necessary to  treat or prevent imminent or life-threatening deterioration of the following conditions:  Respiratory failure   Critical care was time spent personally by me on the following activities:  Discussions with consultants, evaluation of patient's response to treatment, examination of patient, ordering and performing treatments and interventions, ordering and review of laboratory studies, ordering and review of radiographic studies, pulse oximetry, re-evaluation of patient's condition, obtaining history from patient or surrogate and review of old charts   I assumed direction of critical care for this patient from another provider in my specialty: no       ____________________________________________   INITIAL IMPRESSION / Delta / ED COURSE       28 year old male presents to the ED with worsening shortness of breath after recently being diagnosed with COVID-19 2 days ago.  He is not in any respiratory distress, but does have significant hypoxia and room air sats noted to be in the 80s.  He was placed on 6 L nasal cannula with improvement to 93%, if any additional oxygen required would upgrade to high flow nasal cannula.  Will give breathing treatments and steroids, check  EKG and labs.  Low suspicion of cardiac cause to patient's syncopal episode.  He is tachycardic but do not suspect PE given COVID-19 and opacities on chest x-ray or the likely etiology of his hypoxia.  Patient given IV Decadron and now having some improvement in oxygenation, able to maintain O2 sats on 4 L nasal cannula.  Remainder of labs unremarkable, EKG without acute ischemic changes.  Case discussed with hospitalist at [facility], who accepts patient for transfer, requests he be given initial dose of Remdesivir.  Patient transported to [facility] without issue.      ____________________________________________   FINAL CLINICAL IMPRESSION(S) / ED DIAGNOSES  Final diagnoses:  COVID-19 virus infection  Hypoxia  Multifocal pneumonia     ED Discharge Orders    None       Note:  This document was prepared using Dragon voice recognition software and may include unintentional dictation errors.   Blake Divine, MD 02/07/19 1401

## 2019-02-07 NOTE — ED Triage Notes (Addendum)
Arrives with SOB and SAT noted 80% at home. Diagnosed with COVID 2 days ago. SAT 87% at arrival, 02 6l applied with increase to 95%

## 2019-02-07 NOTE — ED Notes (Signed)
Patient instructed on starting neb treatment. Patient verbalized understanding and demonstrated proper use.

## 2019-02-07 NOTE — H&P (Addendum)
History and Physical    Glen RaterBrian Petrea WUJ:[id number] DOB: [birth year] DOA: 02/07/2019  PCP: Alba CorySowles, Krichna, MD  [facility]  I have personally briefly reviewed patient[facility]  Chief Complaint: SOB  HPI: [name] is a 28 y.o. male with medical history significant of past medical history of asthma who presents to the ED with shortness of breath patient reported he started having shortness of breath on 01/27/2019, with weakness ER tested on the third for SARS-CoV-2 which was negative, he is positive for sick contact.  He relates that he was doing fairly well until the day prior to admission that he started developing worsening shortness of breath overnight, on the day of admission he went to stand up and he felt lightheaded and almost passed out did not lose consciousness denies hitting his head.  Denies any diarrhea nausea or vomiting.  ED Course:  At [facility] he was found to be hypoxic satting 87% on room air he was placed on nasal cannula and saturations improved to 93, was found to have a mild elevated blood glucose ferritin of 200 procalcitonin less than 0.1, CRP of 3.0, white count of 6.2 with no left shift his d-dimer was elevated.  Chest x-ray showed bilateral infiltrates.  Review of Systems: As per HPI otherwise 10 point review of systems negative.    Past Medical History:  Diagnosis Date  . Asthma, mild intermittent   . Chronic constipation   . Morbid obesity (HCC)     History reviewed. No pertinent surgical history.   reports that he has never smoked. He has never used smokeless tobacco. He reports current alcohol use of about 1.0 standard drinks of alcohol per week. He reports previous drug use.  No Known Allergies  Family History  Problem Relation Age of Onset  . Obesity Mother   . Kidney disease Father        mass  . Liver cancer Father   . Obesity Sister     Prior to Admission medications   Medication Sig Start  Date End Date Taking? Authorizing Provider  albuterol (PROVENTIL HFA;VENTOLIN HFA) 108 (90 Base) MCG/ACT inhaler TAKE 2 PUFFS BY MOUTH EVERY 6 HOURS AS NEEDED FOR WHEEZE OR SHORTNESS OF BREATH Patient taking differently: Inhale 2 puffs into the lungs every 6 (six) hours as needed for wheezing or shortness of breath.  05/18/18  Yes Sowles, Danna HeftyKrichna, MD  azelastine (ASTELIN) 0.1 % nasal spray Place 2 sprays into both nostrils 2 (two) times daily. Use in each nostril as directed 09/20/18  Yes Sowles, Danna HeftyKrichna, MD  fluticasone (FLONASE) 50 MCG/ACT nasal spray Place 2 sprays into both nostrils daily. 09/20/18  Yes Sowles, Danna HeftyKrichna, MD  ibuprofen (ADVIL) 200 MG tablet Take 600 mg by mouth every 6 (six) hours as needed for fever or moderate pain.   Yes [provider]  loratadine (CLARITIN) 10 MG tablet Take 1 tablet (10 mg total) by mouth daily. 09/20/18  Yes Sowles, Danna HeftyKrichna, MD  montelukast (SINGULAIR) 10 MG tablet Take 1 tablet (10 mg total) by mouth at bedtime. 04/19/18  Yes Sowles, Danna HeftyKrichna, MD  predniSONE (DELTASONE) 10 MG tablet Take 6 tablets  today, on day 2 take 5 tablets, day 3 take 4 tablets, day 4 take 3 tablets, day 5 take  2 tablets and 1 tablet the last day 02/05/19  Yes Bridget HartshornSummers, Rhonda L, PA-C  azithromycin (ZITHROMAX Z-PAK) 250 MG tablet Take 2 tablets (500 mg) on  Day 1,  followed  by 1 tablet (250 mg) once daily on Days 2 through 5. Patient not taking: Reported on 02/07/2019 02/05/19   Tommi Rumps, PA-C    Physical Exam: Vitals:   02/07/19 1353 02/07/19 1509  BP: (!) 141/84   Pulse: (!) 102   Resp: 20   Temp: 99.9 F (37.7 C) 99 F (37.2 C)  TempSrc: Oral Oral  SpO2: 96%   Weight: (!) 190.5 kg   Height: 6\' 4"  (1.93 m)     Constitutional: NAD, calm, comfortable, morbidly obese male Vitals:   02/07/19 1353 02/07/19 1509  BP: (!) 141/84   Pulse: (!) 102   Resp: 20   Temp: 99.9 F (37.7 C) 99 F (37.2 C)  TempSrc: Oral Oral  SpO2: 96%   Weight: (!) 190.5 kg    Height: 6\' 4"  (1.93 m)    Eyes: PERRL, lids and conjunctivae normal ENMT: Dry Mucous membrane. Neck: normal, supple, no masses, no thyromegaly Respiratory: Good air movement with crackles bilaterally. Cardiovascular: Regular rate and rhythm, no murmurs / rubs / gallops. No extremity edema. 2+ pedal pulses. No carotid bruits.  Abdomen: no tenderness, no masses palpated. No hepatosplenomegaly. Bowel sounds positive.  Musculoskeletal: no clubbing / cyanosis. No joint deformity upper and lower extremities. Good ROM, no contractures. Normal muscle tone.  Skin: no rashes, lesions, ulcers. No induration Neurologic: CN 2-12 grossly intact. Sensation intact, DTR normal. Strength 5/5 in all 4.  Psychiatric: Normal judgment and insight. Alert and oriented x 3. Normal mood.   Labs on Admission: I have personally reviewed following labs and imaging studies  CBC: Recent Labs  Lab 02/05/19 0628 02/07/19 0804  WBC 2.6* 6.2  NEUTROABS 1.6* 5.3  HGB 12.6* 12.6*  HCT 39.0 39.0  MCV 72.1* 72.2*  PLT 217 237   Basic Metabolic Panel: Recent Labs  Lab 02/05/19 0628 02/07/19 0804  NA 139 138  K 3.7 3.5  CL 104 104  CO2 24 22  GLUCOSE 120* 133*  BUN 13 12  CREATININE 1.15 1.07  CALCIUM 8.8* 8.3*   GFR: Estimated Creatinine Clearance: 189.9 mL/min (by C-G formula based on SCr of 1.07 mg/dL). Liver Function Tests: Recent Labs  Lab 02/05/19 0628 02/07/19 0804  AST 70* 66*  ALT 82* 55*  ALKPHOS 69 51  BILITOT 1.1 1.1  PROT 7.8 7.1  ALBUMIN 3.8 3.3*   Recent Labs  Lab 02/05/19 0628  LIPASE 19   No results for input(s): AMMONIA in the last 168 hours. Coagulation Profile: No results for input(s): INR, PROTIME in the last 168 hours. Cardiac Enzymes: No results for input(s): CKTOTAL, CKMB, CKMBINDEX, TROPONINI in the last 168 hours. BNP (last 3 results) No results for input(s): PROBNP in the last 8760 hours. HbA1C: No results for input(s): HGBA1C in the last 72 hours. CBG: No  results for input(s): GLUCAP in the last 168 hours. Lipid Profile: No results for input(s): CHOL, HDL, LDLCALC, TRIG, CHOLHDL, LDLDIRECT in the last 72 hours. Thyroid Function Tests: No results for input(s): TSH, T4TOTAL, FREET4, T3FREE, THYROIDAB in the last 72 hours. Anemia Panel: Recent Labs    02/07/19 0805  FERRITIN 200   Urine analysis:    Component Value Date/Time   COLORURINE YELLOW (A) 02/05/2019 0628   APPEARANCEUR HAZY (A) 02/05/2019 0628   LABSPEC 1.017 02/05/2019 0628   PHURINE 6.0 02/05/2019 0628   GLUCOSEU NEGATIVE 02/05/2019 0628   HGBUR NEGATIVE 02/05/2019 0628   BILIRUBINUR NEGATIVE 02/05/2019 0628   KETONESUR 5 (A) 02/05/2019 8938  PROTEINUR NEGATIVE 02/05/2019 0628   UROBILINOGEN 1.0 04/25/2008 1228   NITRITE NEGATIVE 02/05/2019 0628   LEUKOCYTESUR NEGATIVE 02/05/2019 0628    Radiological Exams on Admission: Dg Chest Portable 1 View  Result Date: 02/07/2019 CLINICAL DATA:  Shortness of breath.  Positive COVID-19 EXAM: PORTABLE CHEST 1 VIEW COMPARISON:  February 05, 2019. FINDINGS: There is airspace opacity throughout both lower lung regions as well as the left upper lobe, slightly progressed from 2 days prior. Heart is borderline enlarged with pulmonary vascularity normal. No adenopathy. No bone lesions. IMPRESSION: Multifocal pneumonia, marginally progressed from 2 days prior. Stable cardiac prominence. No evident adenopathy. Electronically Signed   By: Lowella Grip III M.D.   On: 02/07/2019 08:07    EKG: Independently reviewed.  Sinus tachycardia normal axis normal interval wandering baseline.  Assessment/Plan Acute respiratory failure with hypoxia due to COVID-19 virus infection: Continue oxygen by nasal cannula he is on 2 L satting greater than 93%. We will check inflammatory markers CRP, d-dimer and ferritin. We will start on IV Remdesivir and steroids. He has mild hyperglycemia. Start him on vitamin C and zinc. We will try to keep patient  prone for at least 16 hours a day.  Orthostatic hypotension: He describes very nicely lightheadedness upon standing and almost passing out.  Which resolved after he stayed on the floor for several seconds. He relates he has had a decreased oral intake but he denies any diarrhea.  Hyperglycemia: Likely due to COVID-19 virus infection, check an A1c we will start him on sliding scale. I am sure his blood glucose go to rise even further now that was starting Decadron.  Mild transaminitis: In the setting of COVID-19 likely the cause, will continue to monitor intermittently.  Morbid obesity ([place]) Counseling.  Asthma, mild intermittent We will continue his home inhalers. He has no wheezing physical exam.  DVT prophylaxis: Lovenox Code Status: Full Family Communication: None Disposition Plan:  Consults called: None Admission status: Inpatient   Charlynne Cousins MD [facility]   If 7PM-7AM, please contact night-coverage [email or web address] Password [facility]  02/07/2019, 4:16 PM

## 2019-02-07 NOTE — ED Notes (Signed)
Patient's belongings sent with patient.

## 2019-02-07 NOTE — ED Notes (Signed)
[facility]  CALLED  FOR  TRANSFER 

## 2019-02-07 NOTE — ED Notes (Signed)
Patient decreased to 4L [place]. Will continue to monitor for hypoxia

## 2019-02-08 LAB — COMPREHENSIVE METABOLIC PANEL
ALT: 85 U/L — ABNORMAL HIGH (ref 0–44)
AST: 76 U/L — ABNORMAL HIGH (ref 15–41)
Albumin: 3.1 g/dL — ABNORMAL LOW (ref 3.5–5.0)
Alkaline Phosphatase: 53 U/L (ref 38–126)
Anion gap: 5 (ref 5–15)
BUN: 13 mg/dL (ref 6–20)
CO2: 27 mmol/L (ref 22–32)
Calcium: 8.4 mg/dL — ABNORMAL LOW (ref 8.9–10.3)
Chloride: 108 mmol/L (ref 98–111)
Creatinine, Ser: 0.84 mg/dL (ref 0.61–1.24)
GFR calc Af Amer: >60 mL/min (ref >60)
GFR calc non Af Amer: >60 mL/min (ref >60)
Glucose, Bld: 124 mg/dL — ABNORMAL HIGH (ref 70–99)
Potassium: 4.1 mmol/L (ref 3.5–5.1)
Sodium: 140 mmol/L (ref 135–145)
Total Bilirubin: 1.1 mg/dL (ref 0.3–1.2)
Total Protein: 7.2 g/dL (ref 6.5–8.1)

## 2019-02-08 LAB — CBC WITH DIFFERENTIAL/PLATELET
Abs Immature Granulocytes: 0.02 10*3/uL (ref 0.00–0.07)
Basophils Absolute: 0 10*3/uL (ref 0.0–0.1)
Basophils Relative: 0 %
Eosinophils Absolute: 0 10*3/uL (ref 0.0–0.5)
Eosinophils Relative: 0 %
HCT: 37.8 % — ABNORMAL LOW (ref 39.0–52.0)
Hemoglobin: 12 g/dL — ABNORMAL LOW (ref 13.0–17.0)
Immature Granulocytes: 1 %
Lymphocytes Relative: 13 %
Lymphs Abs: 0.6 10*3/uL — ABNORMAL LOW (ref 0.7–4.0)
MCH: 23.4 pg — ABNORMAL LOW (ref 26.0–34.0)
MCHC: 31.7 g/dL (ref 30.0–36.0)
MCV: 73.7 fL — ABNORMAL LOW (ref 80.0–100.0)
Monocytes Absolute: 0.3 10*3/uL (ref 0.1–1.0)
Monocytes Relative: 7 %
Neutro Abs: 3.4 10*3/uL (ref 1.7–7.7)
Neutrophils Relative %: 79 %
Platelets: 201 10*3/uL (ref 150–400)
RBC: 5.13 MIL/uL (ref 4.22–5.81)
RDW: 14.2 % (ref 11.5–15.5)
WBC: 4.3 10*3/uL (ref 4.0–10.5)
nRBC: 0 % (ref 0.0–0.2)

## 2019-02-08 LAB — GLUCOSE, CAPILLARY
Glucose-Capillary: 107 mg/dL — ABNORMAL HIGH (ref 70–99)
Glucose-Capillary: 118 mg/dL — ABNORMAL HIGH (ref 70–99)
Glucose-Capillary: 114 mg/dL — ABNORMAL HIGH (ref 70–99)
Glucose-Capillary: 132 mg/dL — ABNORMAL HIGH (ref 70–99)

## 2019-02-08 LAB — C-REACTIVE PROTEIN: CRP: 4.3 mg/dL — ABNORMAL HIGH (ref <1.0)

## 2019-02-08 LAB — D-DIMER, QUANTITATIVE: D-Dimer, Quant: 1.05 ug/mL-FEU — ABNORMAL HIGH (ref 0.00–0.50)

## 2019-02-08 LAB — HEMOGLOBIN A1C
Hgb A1c MFr Bld: 5.7 % — ABNORMAL HIGH (ref 4.8–5.6)
Mean Plasma Glucose: 117 mg/dL

## 2019-02-08 LAB — FERRITIN: Ferritin: 187 ng/mL (ref 24–336)

## 2019-02-08 NOTE — Progress Notes (Signed)
[facility] PROGRESS NOTE    Progress Note  [name]  GDJ:[id number] DOB: Nov 02, 1990 DOA: 02/07/2019 PCP: Steele Sizer, MD     Brief Narrative:   [name] is an 28 y.o. male this medical history significant for asthma presents to the ED with shortness of breath that started on 01/27/2019 tested positive in the ED day of admission for COVID-19.  ED was found to be hypoxic and a positive chest x-ray.  Assessment/Plan:   Acute respiratory failure with hypoxia due to COVID-19 viral infection: Requiring 6 L to keep saturations over 92%. Inflammatory markers are elevated he was started on IV Remdesivir and IV steroids. Continue vitamin C and zinc. Try to keep the patient prone for at least 16 hours a day.  Orthostatic hypotension: Likely due to hypovolemia, he has been hydrated. KVO IV fluid he relates that his dizziness upon standing has resolved. Recheck orthostatics tomorrow morning.  Hyperglycemia: In the setting of COVID-19 viral infection and steroids Continue sliding scale insulin A1c was 5.7.  Morbid obesity: With a BMI greater than 50 counseling was performed.  Asthma, mild intermittent: Continue home inhalers he seems to be stable no wheezing on physical exam.   DVT prophylaxis: lovenox Family Communication:none Disposition Plan/Barrier to D/C: once off oxygen Code Status:     Code Status Orders  (From admission, onward)         Start     Ordered   02/07/19 1507  Full code  Continuous     02/07/19 1508        Code Status History    This patient has a current code status but no historical code status.   Advance Care Planning Activity        IV Access:    Peripheral IV   Procedures and diagnostic studies:   Dg Chest Portable 1 View  Result Date: 02/07/2019 CLINICAL DATA:  Shortness of breath.  Positive COVID-19 EXAM: PORTABLE CHEST 1 VIEW COMPARISON:  February 05, 2019. FINDINGS: There is airspace opacity throughout both  lower lung regions as well as the left upper lobe, slightly progressed from 2 days prior. Heart is borderline enlarged with pulmonary vascularity normal. No adenopathy. No bone lesions. IMPRESSION: Multifocal pneumonia, marginally progressed from 2 days prior. Stable cardiac prominence. No evident adenopathy. Electronically Signed   By: Lowella Grip III M.D.   On: 02/07/2019 08:07     Medical Consultants:    None.  Anti-Infectives:   None  Subjective:    [name] relates his breathing is unchanged compared to yesterday.  Objective:    Vitals:   02/07/19 1509 02/07/19 1748 02/07/19 1919 02/08/19 0351  BP:  (!) 145/85 (!) 143/90   Pulse:      Resp:  20    Temp: 99 F (37.2 C) 98.4 F (36.9 C) 98.5 F (36.9 C) 98.3 F (36.8 C)  TempSrc: Oral Oral Oral Oral  SpO2: 91%   92%  Weight:      Height:       SpO2: 92 % O2 Flow Rate (L/min): 6 L/min   Intake/Output Summary (Last 24 hours) at 02/08/2019 0658 Last data filed at 02/08/2019 0400 Gross per 24 hour  Intake 2381.26 ml  Output 1 ml  Net 2380.26 ml   Filed Weights   02/07/19 1353  Weight: (!) 190.5 kg    Exam: General exam: In no acute distress. Respiratory system: Good air movement and diffuse crackles bilaterally. Cardiovascular system: S1 & S2 heard, RRR. No JVD.  Gastrointestinal system: Abdomen is nondistended, soft and nontender.  Central nervous system: Alert and oriented. No focal neurological deficits. Extremities: No pedal edema. Skin: No rashes, lesions or ulcers Psychiatry: Judgement and insight appear normal. Mood & affect appropriate.    Data Reviewed:    Labs: Basic Metabolic Panel: Recent Labs  Lab 02/05/19 0628 02/07/19 0804 02/08/19 0410  NA 139 138 140  K 3.7 3.5 4.1  CL 104 104 108  CO2 24 22 27   GLUCOSE 120* 133* 124*  BUN 13 12 13   CREATININE 1.15 1.07 0.84  CALCIUM 8.8* 8.3* 8.4*   GFR Estimated Creatinine Clearance: 241.8 mL/min (by C-G formula based on SCr  of 0.84 mg/dL). Liver Function Tests: Recent Labs  Lab 02/05/19 0628 02/07/19 0804 02/08/19 0410  AST 70* 66* 76*  ALT 82* 55* 85*  ALKPHOS 69 51 53  BILITOT 1.1 1.1 1.1  PROT 7.8 7.1 7.2  ALBUMIN 3.8 3.3* 3.1*   Recent Labs  Lab 02/05/19 0628  LIPASE 19   No results for input(s): AMMONIA in the last 168 hours. Coagulation profile No results for input(s): INR, PROTIME in the last 168 hours. COVID-19 Labs  Recent Labs    02/07/19 0805 02/07/19 1117 02/07/19 1535 02/08/19 0410  DDIMER  --   --  0.51* 1.05*  FERRITIN 200  --  541* 187  CRP  --  3.0* <0.8 4.3*    Lab Results  Component Value Date   SARSCOV2NAA POSITIVE (A) 02/05/2019   SARSCOV2NAA Not Detected 01/28/2019    CBC: Recent Labs  Lab 02/05/19 0628 02/07/19 0804 02/08/19 0410  WBC 2.6* 6.2 4.3  NEUTROABS 1.6* 5.3 3.4  HGB 12.6* 12.6* 12.0*  HCT 39.0 39.0 37.8*  MCV 72.1* 72.2* 73.7*  PLT 217 237 201   Cardiac Enzymes: No results for input(s): CKTOTAL, CKMB, CKMBINDEX, TROPONINI in the last 168 hours. BNP (last 3 results) No results for input(s): PROBNP in the last 8760 hours. CBG: Recent Labs  Lab 02/07/19 1654 02/07/19 2110  GLUCAP 117* 135*   D-Dimer: Recent Labs    02/07/19 1535 02/08/19 0410  DDIMER 0.51* 1.05*   Hgb A1c: Recent Labs    02/07/19 1535  HGBA1C 5.7*   Lipid Profile: No results for input(s): CHOL, HDL, LDLCALC, TRIG, CHOLHDL, LDLDIRECT in the last 72 hours. Thyroid function studies: No results for input(s): TSH, T4TOTAL, T3FREE, THYROIDAB in the last 72 hours.  Invalid input(s): FREET3 Anemia work up: Recent Labs    02/07/19 1535 02/08/19 0410  FERRITIN 541* 187   Sepsis Labs: Recent Labs  Lab 02/05/19 0628 02/07/19 0804 02/07/19 0805 02/07/19 1535 02/08/19 0410  PROCALCITON  --   --  <0.10 <0.10  --   WBC 2.6* 6.2  --   --  4.3   Microbiology Recent Results (from the past 240 hour(s))  SARS Coronavirus 2 [facility](Hospital order, Performed [facility])     Status: Abnormal   Collection Time: 02/05/19  8:40 AM  Result Value Ref Range Status   SARS Coronavirus 2 POSITIVE (A) NEGATIVE Final    Comment: RESULT CALLED TO, READ BACK BY AND VERIFIED WITH: BRANDY DAVIS AT 1307 02/05/2019.PMF (NOTE) If result is NEGATIVE SARS-CoV-2 target nucleic acids are NOT DETECTED. The SARS-CoV-2 RNA is generally detectable in upper and lower  respiratory specimens during the acute phase of infection. The lowest  concentration of SARS-CoV-2 viral copies this assay can detect is 250  copies / mL. A negative result does not  preclude SARS-CoV-2 infection  and should not be used as the sole basis for treatment or other  patient management decisions.  A negative result may occur with  improper specimen collection / handling, submission of specimen other  than nasopharyngeal swab, presence of viral mutation(s) within the  areas targeted by this assay, and inadequate number of viral copies  (<250 copies / mL). A negative result must be combined with clinical  observations, patient history, and epidemiological information. If result is POSITIVE SARS-CoV-2 target nucleic acids are DETECTED. Th e SARS-CoV-2 RNA is generally detectable in upper and lower  respiratory specimens during the acute phase of infection.  Positive  results are indicative of active infection with SARS-CoV-2.  Clinical  correlation with patient history and other diagnostic information is  necessary to determine patient infection status.  Positive results do  not rule out bacterial infection or co-infection with other viruses. If result is PRESUMPTIVE POSTIVE SARS-CoV-2 nucleic acids MAY BE PRESENT.   A presumptive positive result was obtained on the submitted specimen  and confirmed on repeat testing.  While 2019 novel coronavirus  (SARS-CoV-2) nucleic acids may be present in the submitted sample  additional confirmatory testing may be necessary for epidemiological    and / or clinical management purposes  to differentiate between  SARS-CoV-2 and other Sarbecovirus currently known to infect humans.  If clinically indicated additional testing with an alternate test  methodology [phone]) is  advised. The SARS-CoV-2 RNA is generally  detectable in upper and lower respiratory specimens during the acute  phase of infection. The expected result is Negative. Fact Sheet for Patients:  BoilerBrush.com.cy Fact Sheet for Healthcare Providers: https://pope.com/ This test is not yet approved or cleared by the [place] FDA and has been authorized for detection and/or diagnosis of SARS-CoV-2 by FDA under an Emergency Use Authorization (EUA).  This EUA will remain in effect (meaning this test can be used) for the duration of the COVID-19 declaration under Section 564(b)(1) of the Act, 21 U.S.C. section 360bbb-3(b)(1), unless the authorization is terminated or revoked sooner. Performed at [facility], [address] [place], [place] [address]      Medications:    albuterol  2 puff Inhalation Q4H   dexamethasone  6 mg Oral Q12H   enoxaparin (LOVENOX) injection  100 mg Subcutaneous Q24H   fluticasone  2 spray Each Nare Daily   influenza vac split quadrivalent PF  0.5 mL Intramuscular Tomorrow-1000   insulin aspart  0-20 Units Subcutaneous TID WC   insulin aspart  0-5 Units Subcutaneous QHS   insulin aspart  6 Units Subcutaneous TID WC   montelukast  10 mg Oral QHS   pneumococcal 23 valent vaccine  0.5 mL Intramuscular Tomorrow-1000   vitamin C  500 mg Oral Daily   zinc sulfate  220 mg Oral Daily   Continuous Infusions:  sodium chloride     sodium chloride 75 mL/hr at 02/08/19 0400   remdesivir 100 mg in NS 250 mL        LOS: 1 day   Marinda Elk  [facility]  02/08/2019, 6:58 AM

## 2019-02-08 NOTE — Progress Notes (Signed)
Patient stated he would update family.

## 2019-02-08 NOTE — Progress Notes (Signed)
Called mother to give her an update about her son's condition and how he is improving now off oxygen she was very pleased.  It is to note that she has 3 of her nuclear family members hospitalized.  She was very appreciative of the call and update.

## 2019-02-09 DIAGNOSIS — D509 Iron deficiency anemia, unspecified: Secondary | ICD-10-CM

## 2019-02-09 DIAGNOSIS — R740 Nonspecific elevation of levels of transaminase and lactic acid dehydrogenase [LDH]: Secondary | ICD-10-CM

## 2019-02-09 DIAGNOSIS — J1289 Other viral pneumonia: Secondary | ICD-10-CM

## 2019-02-09 LAB — COMPREHENSIVE METABOLIC PANEL
ALT: 81 U/L — ABNORMAL HIGH (ref 0–44)
AST: 53 U/L — ABNORMAL HIGH (ref 15–41)
Albumin: 3.2 g/dL — ABNORMAL LOW (ref 3.5–5.0)
Alkaline Phosphatase: 51 U/L (ref 38–126)
Anion gap: 7 (ref 5–15)
BUN: 17 mg/dL (ref 6–20)
CO2: 27 mmol/L (ref 22–32)
Calcium: 8.6 mg/dL — ABNORMAL LOW (ref 8.9–10.3)
Chloride: 109 mmol/L (ref 98–111)
Creatinine, Ser: 0.89 mg/dL (ref 0.61–1.24)
GFR calc Af Amer: >60 mL/min (ref >60)
GFR calc non Af Amer: >60 mL/min (ref >60)
Glucose, Bld: 114 mg/dL — ABNORMAL HIGH (ref 70–99)
Potassium: 4.2 mmol/L (ref 3.5–5.1)
Sodium: 143 mmol/L (ref 135–145)
Total Bilirubin: 0.9 mg/dL (ref 0.3–1.2)
Total Protein: 6.8 g/dL (ref 6.5–8.1)

## 2019-02-09 LAB — GLUCOSE, CAPILLARY
Glucose-Capillary: 108 mg/dL — ABNORMAL HIGH (ref 70–99)
Glucose-Capillary: 95 mg/dL (ref 70–99)
Glucose-Capillary: 104 mg/dL — ABNORMAL HIGH (ref 70–99)
Glucose-Capillary: 152 mg/dL — ABNORMAL HIGH (ref 70–99)

## 2019-02-09 LAB — CBC WITH DIFFERENTIAL/PLATELET
Abs Immature Granulocytes: 0.06 10*3/uL (ref 0.00–0.07)
Basophils Absolute: 0 10*3/uL (ref 0.0–0.1)
Basophils Relative: 0 %
Eosinophils Absolute: 0 10*3/uL (ref 0.0–0.5)
Eosinophils Relative: 0 %
HCT: 38.6 % — ABNORMAL LOW (ref 39.0–52.0)
Hemoglobin: 12.1 g/dL — ABNORMAL LOW (ref 13.0–17.0)
Immature Granulocytes: 1 %
Lymphocytes Relative: 17 %
Lymphs Abs: 0.8 10*3/uL (ref 0.7–4.0)
MCH: 23.6 pg — ABNORMAL LOW (ref 26.0–34.0)
MCHC: 31.3 g/dL (ref 30.0–36.0)
MCV: 75.2 fL — ABNORMAL LOW (ref 80.0–100.0)
Monocytes Absolute: 0.4 10*3/uL (ref 0.1–1.0)
Monocytes Relative: 9 %
Neutro Abs: 3.2 10*3/uL (ref 1.7–7.7)
Neutrophils Relative %: 73 %
Platelets: 208 10*3/uL (ref 150–400)
RBC: 5.13 MIL/uL (ref 4.22–5.81)
RDW: 14.2 % (ref 11.5–15.5)
WBC: 4.5 10*3/uL (ref 4.0–10.5)
nRBC: 0 % (ref 0.0–0.2)

## 2019-02-09 LAB — FERRITIN: Ferritin: 186 ng/mL (ref 24–336)

## 2019-02-09 LAB — D-DIMER, QUANTITATIVE: D-Dimer, Quant: 0.73 ug/mL-FEU — ABNORMAL HIGH (ref 0.00–0.50)

## 2019-02-09 LAB — C-REACTIVE PROTEIN: CRP: 1.5 mg/dL — ABNORMAL HIGH (ref <1.0)

## 2019-02-09 LAB — HIV ANTIBODY (ROUTINE TESTING W REFLEX): HIV Screen 4th Generation wRfx: NONREACTIVE

## 2019-02-09 MED ORDER — DEXAMETHASONE 6 MG PO TABS
6.0000 mg | ORAL_TABLET | Freq: Every day | ORAL | Status: DC
  Administered 2019-02-10 – 2019-02-11 (×2): 6 mg via ORAL
  Filled 2019-02-09 (×2): qty 1

## 2019-02-09 MED ORDER — SODIUM CHLORIDE 0.9% FLUSH
3.0000 mL | Freq: Two times a day (BID) | INTRAVENOUS | Status: DC
  Administered 2019-02-09 – 2019-02-11 (×3): 3 mL via INTRAVENOUS

## 2019-02-09 NOTE — TOC Initial Note (Signed)
Transition of Care [facility]) - Initial/Assessment Note    Patient Details  Name: [name] MRN: [id number] Date of Birth: 1991/05/18  Transition of [facility] CM/SW Contact:    Ninfa Meeker, RN Phone Number: [phone] (working remotely) 02/09/2019, 9:27 AM  Clinical Narrative:  28 yr old gentleman admitted and treated for COVID 19. Thankfully patient is improving and will discharge home No needs identified by case manager.                        Patient Goals and CMS Choice        Expected Discharge Plan and Services                                                Prior Living Arrangements/Services                       Activities of Daily Living Home Assistive Devices/Equipment: None ADL Screening (condition at time of admission) Patient's cognitive ability adequate to safely complete daily activities?: Yes Is the patient deaf or have difficulty hearing?: No Does the patient have difficulty seeing, even when wearing glasses/contacts?: No Does the patient have difficulty concentrating, remembering, or making decisions?: No Patient able to express need for assistance with ADLs?: Yes Does the patient have difficulty dressing or bathing?: No Independently performs ADLs?: Yes (appropriate for developmental age) Does the patient have difficulty walking or climbing stairs?: No Weakness of Legs: None Weakness of Arms/Hands: None  Permission Sought/Granted                  Emotional Assessment              Admission diagnosis:  HYPOXIA PNEUMONIA COVID-19 VIRUS INFECTION Patient Active Problem List   Diagnosis Date Noted  . COVID-19 virus infection 02/07/2019  . Acute respiratory failure with hypoxia ([place]) 02/07/2019  . Orthostatic hypotension 02/07/2019  . Asthma, mild intermittent   . Morbid obesity ([place]) 02/08/2008   PCP:  Steele Sizer, MD Pharmacy:   [id number] - [place], [place] [address] [place] [address] Phone: [phone] Fax: [phone]     Social Determinants of Health (SDOH) Interventions    Readmission Risk Interventions No flowsheet data found.

## 2019-02-09 NOTE — Plan of Care (Signed)

## 2019-02-09 NOTE — Progress Notes (Signed)
[facility] PROGRESS NOTE    Progress Note  [name]  UEA:[id number] DOB: 12/24/1990 DOA: 02/07/2019 PCP: Steele Sizer, MD     Brief Narrative:   [name] is an 28 y.o. male with past medical history significant for asthma presented to the ED with shortness of breath that started on 01/27/2019 tested positive in the ED day of admission for COVID-19.  Was found to be hypoxic.  Was hospitalized for further management.  Assessment/Plan:   Acute respiratory failure with hypoxia due to COVID-19 viral infection: Patient appears to have improved.  He initially required 6 L of oxygen.  Now saturating normal on room air.  Symptomatically he is feeling better.  Inflammatory marker has improved.  CRP is down to 1.5.  Patient remains on Remdesivir and steroids.  Continue vitamin C and zinc.  Prone positioning, mobilization and incentive spirometry.  Start tapering steroids.  COVID-19 Labs  Recent Labs    02/07/19 1535 02/08/19 0410 02/09/19 0145  DDIMER 0.51* 1.05* 0.73*  FERRITIN 541* 187 186  CRP <0.8 4.3* 1.5*    Lab Results  Component Value Date   SARSCOV2NAA POSITIVE (A) 02/05/2019   [place] Not Detected 01/28/2019    Orthostatic hypotension This was likely due to hypovolemia.  He has been hydrated.  Feels better.  Has been ambulating.    Hyperglycemia This is in the setting of COVID-19 as well as steroids.  CBGs are stable.  HbA1c 5.7.  Continue sliding scale coverage for now.    Mild transaminitis Most likely due to COVID-19.  Stable.  Morbid obesity Estimated body mass index is 51.12 kg/m as calculated from the following:   Height as of this encounter: 6\' 4"  (1.93 m).   Weight as of this encounter: 190.5 kg.  Asthma, mild intermittent: Stable.  Not wheezing currently.    Mild microcytic anemia Check anemia panel tomorrow.  No evidence for overt bleeding.  DVT prophylaxis: lovenox Family Communication: Discussed with the patient.  Yesterday  discussed with the patient's mother by the rounding MD. Disposition Plan/Barrier to D/C: Patient has been improving.  He will continue Remdesivir to complete a 5-day course. Code Status: Full code   IV Access:    Peripheral IV   Procedures and diagnostic studies:   No results found.   Medical Consultants:    None.  Anti-Infectives:   None  Subjective:    Patient states that he is feeling better.  Able to breathe better.  Still has a dry cough.  Objective:    Vitals:   02/08/19 1800 02/08/19 1946 02/09/19 0322 02/09/19 0724  BP:  126/69 (!) 145/80 (!) 106/54  Pulse:  88  80  Resp:  18  18  Temp:  99 F (37.2 C) 98.5 F (36.9 C) 98.6 F (37 C)  TempSrc:  Oral Oral Oral  SpO2: 97% 93%  97%  Weight:      Height:       SpO2: 97 % O2 Flow Rate (L/min): 1 L/min   Intake/Output Summary (Last 24 hours) at 02/09/2019 1052 Last data filed at 02/09/2019 0500 Gross per 24 hour  Intake 1030 ml  Output 0 ml  Net 1030 ml   Filed Weights   02/07/19 1353  Weight: (!) 190.5 kg    Exam:  General appearance: Awake alert.  In no distress.  Morbidly obese Resp: Normal effort at rest.  Few crackles at the bases.  No wheezing or rhonchi.   Cardio: S1-S2 is normal regular.  No  S3-S4.  No rubs murmurs or bruit GI: Abdomen is soft.  Nontender nondistended.  Bowel sounds are present normal.  No masses organomegaly Extremities: No edema.  Full range of motion of lower extremities. Neurologic: Alert and oriented x3.  No focal neurological deficits.     Data Reviewed:    Labs: Basic Metabolic Panel: Recent Labs  Lab 02/05/19 0628 02/07/19 0804 02/08/19 0410 02/09/19 0145  NA 139 138 140 143  K 3.7 3.5 4.1 4.2  CL 104 104 108 109  CO2 24 22 27 27   GLUCOSE 120* 133* 124* 114*  BUN 13 12 13 17   CREATININE 1.15 1.07 0.84 0.89  CALCIUM 8.8* 8.3* 8.4* 8.6*   GFR Estimated Creatinine Clearance: 228.2 mL/min (by C-G formula based on SCr of 0.89 mg/dL). Liver  Function Tests: Recent Labs  Lab 02/05/19 0628 02/07/19 0804 02/08/19 0410 02/09/19 0145  AST 70* 66* 76* 53*  ALT 82* 55* 85* 81*  ALKPHOS 69 51 53 51  BILITOT 1.1 1.1 1.1 0.9  PROT 7.8 7.1 7.2 6.8  ALBUMIN 3.8 3.3* 3.1* 3.2*   Recent Labs  Lab 02/05/19 0628  LIPASE 19   COVID-19 Labs  Recent Labs    02/07/19 1535 02/08/19 0410 02/09/19 0145  DDIMER 0.51* 1.05* 0.73*  FERRITIN 541* 187 186  CRP <0.8 4.3* 1.5*    Lab Results  Component Value Date   SARSCOV2NAA POSITIVE (A) 02/05/2019   SARSCOV2NAA Not Detected 01/28/2019    CBC: Recent Labs  Lab 02/05/19 0628 02/07/19 0804 02/08/19 0410 02/09/19 0145  WBC 2.6* 6.2 4.3 4.5  NEUTROABS 1.6* 5.3 3.4 3.2  HGB 12.6* 12.6* 12.0* 12.1*  HCT 39.0 39.0 37.8* 38.6*  MCV 72.1* 72.2* 73.7* 75.2*  PLT 217 237 201 208   CBG: Recent Labs  Lab 02/08/19 0719 02/08/19 1146 02/08/19 1628 02/08/19 1946 02/09/19 0730  GLUCAP 107* 118* 114* 132* 108*   D-Dimer: Recent Labs    02/08/19 0410 02/09/19 0145  DDIMER 1.05* 0.73*   Hgb A1c: Recent Labs    02/07/19 1535  HGBA1C 5.7*    Anemia work up: Recent Labs    02/08/19 0410 02/09/19 0145  FERRITIN 187 186   Sepsis Labs: Recent Labs  Lab 02/05/19 0628 02/07/19 0804 02/07/19 0805 02/07/19 1535 02/08/19 0410 02/09/19 0145  PROCALCITON  --   --  <0.10 <0.10  --   --   WBC 2.6* 6.2  --   --  4.3 4.5   Microbiology Recent Results (from the past 240 hour(s))  SARS Coronavirus 2 [facility](Hospital order, Performed in [facility] hospital lab)     Status: Abnormal   Collection Time: 02/05/19  8:40 AM  Result Value Ref Range Status   SARS Coronavirus 2 POSITIVE (A) NEGATIVE Final    Comment: RESULT CALLED TO, READ BACK BY AND VERIFIED WITH: BRANDY DAVIS AT 1307 02/05/2019.PMF (NOTE) If result is NEGATIVE SARS-CoV-2 target nucleic acids are NOT DETECTED. The SARS-CoV-2 RNA is generally detectable in upper and lower  respiratory specimens during the acute  phase of infection. The lowest  concentration of SARS-CoV-2 viral copies this assay can detect is 250  copies / mL. A negative result does not preclude SARS-CoV-2 infection  and should not be used as the sole basis for treatment or other  patient management decisions.  A negative result may occur with  improper specimen collection / handling, submission of specimen other  than nasopharyngeal swab, presence of viral mutation(s) within the  areas targeted by this  assay, and inadequate number of viral copies  (<250 copies / mL). A negative result must be combined with clinical  observations, patient history, and epidemiological information. If result is POSITIVE SARS-CoV-2 target nucleic acids are DETECTED. Th e SARS-CoV-2 RNA is generally detectable in upper and lower  respiratory specimens during the acute phase of infection.  Positive  results are indicative of active infection with SARS-CoV-2.  Clinical  correlation with patient history and other diagnostic information is  necessary to determine patient infection status.  Positive results do  not rule out bacterial infection or co-infection with other viruses. If result is PRESUMPTIVE POSTIVE SARS-CoV-2 nucleic acids MAY BE PRESENT.   A presumptive positive result was obtained on the submitted specimen  and confirmed on repeat testing.  While 2019 novel coronavirus  (SARS-CoV-2) nucleic acids may be present in the submitted sample  additional confirmatory testing may be necessary for epidemiological  and / or clinical management purposes  to differentiate between  SARS-CoV-2 and other Sarbecovirus currently known to infect humans.  If clinically indicated additional testing with an alternate test  methodology [phone]) is  advised. The SARS-CoV-2 RNA is generally  detectable in upper and lower respiratory specimens during the acute  phase of infection. The expected result is Negative. Fact Sheet for Patients:   BoilerBrush.com.cy Fact Sheet for Healthcare Providers: https://pope.com/ This test is not yet approved or cleared by the [place] FDA and has been authorized for detection and/or diagnosis of SARS-CoV-2 by FDA under an Emergency Use Authorization (EUA).  This EUA will remain in effect (meaning this test can be used) for the duration of the COVID-19 declaration under Section 564(b)(1) of the Act, 21 U.S.C. section 360bbb-3(b)(1), unless the authorization is terminated or revoked sooner. Performed at [facility], [address] [place], [place] [address]      Medications:   . albuterol  2 puff Inhalation Q4H  . dexamethasone  6 mg Oral Q12H  . enoxaparin (LOVENOX) injection  100 mg Subcutaneous Q24H  . fluticasone  2 spray Each Nare Daily  . influenza vac split quadrivalent PF  0.5 mL Intramuscular Tomorrow-1000  . insulin aspart  0-20 Units Subcutaneous TID WC  . insulin aspart  0-5 Units Subcutaneous QHS  . insulin aspart  6 Units Subcutaneous TID WC  . montelukast  10 mg Oral QHS  . pneumococcal 23 valent vaccine  0.5 mL Intramuscular Tomorrow-1000  . sodium chloride flush  3 mL Intravenous Q12H  . vitamin C  500 mg Oral Daily  . zinc sulfate  220 mg Oral Daily   Continuous Infusions: . sodium chloride    . remdesivir 100 mg in NS 250 mL Stopped (02/08/19 1710)      LOS: 2 days   Osvaldo Shipper  [facility]  Pager: [email or web address]  02/09/2019, 10:52 AM

## 2019-02-10 LAB — COMPREHENSIVE METABOLIC PANEL
ALT: 138 U/L — ABNORMAL HIGH (ref 0–44)
AST: 114 U/L — ABNORMAL HIGH (ref 15–41)
Albumin: 3 g/dL — ABNORMAL LOW (ref 3.5–5.0)
Alkaline Phosphatase: 48 U/L (ref 38–126)
Anion gap: 7 (ref 5–15)
BUN: 17 mg/dL (ref 6–20)
CO2: 27 mmol/L (ref 22–32)
Calcium: 8.6 mg/dL — ABNORMAL LOW (ref 8.9–10.3)
Chloride: 108 mmol/L (ref 98–111)
Creatinine, Ser: 0.89 mg/dL (ref 0.61–1.24)
GFR calc Af Amer: >60 mL/min (ref >60)
GFR calc non Af Amer: >60 mL/min (ref >60)
Glucose, Bld: 103 mg/dL — ABNORMAL HIGH (ref 70–99)
Potassium: 4 mmol/L (ref 3.5–5.1)
Sodium: 142 mmol/L (ref 135–145)
Total Bilirubin: 0.7 mg/dL (ref 0.3–1.2)
Total Protein: 6.7 g/dL (ref 6.5–8.1)

## 2019-02-10 LAB — RETICULOCYTES
Immature Retic Fract: 22.5 % — ABNORMAL HIGH (ref 2.3–15.9)
RBC.: 5.01 MIL/uL (ref 4.22–5.81)
Retic Count, Absolute: 75.7 10*3/uL (ref 19.0–186.0)
Retic Ct Pct: 1.5 % (ref 0.4–3.1)

## 2019-02-10 LAB — CBC WITH DIFFERENTIAL/PLATELET
Abs Immature Granulocytes: 0.14 10*3/uL — ABNORMAL HIGH (ref 0.00–0.07)
Basophils Absolute: 0 10*3/uL (ref 0.0–0.1)
Basophils Relative: 0 %
Eosinophils Absolute: 0 10*3/uL (ref 0.0–0.5)
Eosinophils Relative: 0 %
HCT: 37.3 % — ABNORMAL LOW (ref 39.0–52.0)
Hemoglobin: 11.8 g/dL — ABNORMAL LOW (ref 13.0–17.0)
Immature Granulocytes: 2 %
Lymphocytes Relative: 27 %
Lymphs Abs: 1.6 10*3/uL (ref 0.7–4.0)
MCH: 23.6 pg — ABNORMAL LOW (ref 26.0–34.0)
MCHC: 31.6 g/dL (ref 30.0–36.0)
MCV: 74.5 fL — ABNORMAL LOW (ref 80.0–100.0)
Monocytes Absolute: 0.7 10*3/uL (ref 0.1–1.0)
Monocytes Relative: 11 %
Neutro Abs: 3.4 10*3/uL (ref 1.7–7.7)
Neutrophils Relative %: 60 %
Platelets: 226 10*3/uL (ref 150–400)
RBC: 5.01 MIL/uL (ref 4.22–5.81)
RDW: 14.3 % (ref 11.5–15.5)
WBC: 5.8 10*3/uL (ref 4.0–10.5)
nRBC: 0 % (ref 0.0–0.2)

## 2019-02-10 LAB — FOLATE: Folate: 7.5 ng/mL (ref >5.9)

## 2019-02-10 LAB — IRON AND TIBC
Iron: 61 ug/dL (ref 45–182)
Saturation Ratios: 28 % (ref 17.9–39.5)
TIBC: 215 ug/dL — ABNORMAL LOW (ref 250–450)
UIBC: 154 ug/dL

## 2019-02-10 LAB — FERRITIN: Ferritin: 230 ng/mL (ref 24–336)

## 2019-02-10 LAB — GLUCOSE, CAPILLARY
Glucose-Capillary: 81 mg/dL (ref 70–99)
Glucose-Capillary: 102 mg/dL — ABNORMAL HIGH (ref 70–99)
Glucose-Capillary: 105 mg/dL — ABNORMAL HIGH (ref 70–99)
Glucose-Capillary: 117 mg/dL — ABNORMAL HIGH (ref 70–99)

## 2019-02-10 LAB — VITAMIN B12: Vitamin B-12: 522 pg/mL (ref 180–914)

## 2019-02-10 LAB — D-DIMER, QUANTITATIVE: D-Dimer, Quant: 0.61 ug/mL-FEU — ABNORMAL HIGH (ref 0.00–0.50)

## 2019-02-10 LAB — C-REACTIVE PROTEIN: CRP: 0.8 mg/dL (ref <1.0)

## 2019-02-10 NOTE — Plan of Care (Signed)

## 2019-02-10 NOTE — Progress Notes (Signed)
[facility] PROGRESS NOTE    Progress Note  [name]  ZOX:[id number] DOB: 1991-04-05 DOA: 02/07/2019 PCP: Alba CorySowles, Krichna, MD     Brief Narrative:   [name] is an 28 y.o. male with past medical history significant for asthma presented to the ED with shortness of breath that started on 01/27/2019 tested positive in the ED day of admission for COVID-19.  Was found to be hypoxic.  Was hospitalized for further management.  Assessment/Plan:   Acute respiratory failure with hypoxia due to COVID-19 viral infection: Patient's respiratory status has improved.  He initially was requiring 6 L of oxygen.  Now he saturating normal on room air.  Inflammatory markers have improved.  Patient will complete the course of his Remdesivir tomorrow.  He remains on steroids.  Continue vitamin C and zinc.  Continue with mobilization and incentive spirometry.  Steroids being tapered gradually.    COVID-19 Labs  Recent Labs    02/08/19 0410 02/09/19 0145 02/10/19 0135  DDIMER 1.05* 0.73* 0.61*  FERRITIN 187 186 230  CRP 4.3* 1.5* 0.8    Lab Results  Component Value Date   SARSCOV2NAA POSITIVE (A) 02/05/2019   SARSCOV2NAA Not Detected 01/28/2019    Orthostatic hypotension This was likely due to hypovolemia.  He has been hydrated.  Feels better.  Has been ambulating.    Hyperglycemia This is in the setting of COVID-19 as well as steroids.  CBGs are stable.  HbA1c is 5.7.  Continue SSI for now.    Transaminitis Elevated LFTs are most likely due to COVID-19.  He will need this to be rechecked in the outpatient setting and few weeks.  This was communicated to the patient.    Morbid obesity Estimated body mass index is 51.12 kg/m as calculated from the following:   Height as of this encounter: 6\' 4"  (1.93 m).   Weight as of this encounter: 190.5 kg.  Asthma, mild intermittent: Stable.  Not wheezing currently.    Mild microcytic anemia Hemoglobin is stable.  No evidence of overt  bleeding.  Anemia panel reviewed.  No deficiencies identified.  Outpatient monitoring.    DVT prophylaxis: lovenox Family Communication: Discussed with the patient Disposition Plan/Barrier to D/C: Hopefully home tomorrow after his last dose of Remdesivir  Code Status: Full code   IV Access:    Peripheral IV   Procedures and diagnostic studies:   No results found.   Medical Consultants:    None.  Anti-Infectives:   None  Subjective:   Patient states that he is feeling better.  Occasional dry cough.  No dizziness or lightheadedness.  No nausea vomiting.  Objective:    Vitals:   02/09/19 1525 02/09/19 1923 02/10/19 0408 02/10/19 0721  BP: 134/85 (!) 130/92 139/89 114/72  Pulse: 78 77 73 77  Resp: 18   18  Temp: 98.8 F (37.1 C) 98.7 F (37.1 C) 98.5 F (36.9 C) 98.6 F (37 C)  TempSrc: Oral Oral Oral Oral  SpO2: 98% 98% 97% 96%  Weight:      Height:       SpO2: 96 % O2 Flow Rate (L/min): 1 L/min   Intake/Output Summary (Last 24 hours) at 02/10/2019 1112 Last data filed at 02/10/2019 0500 Gross per 24 hour  Intake 440 ml  Output -  Net 440 ml   Filed Weights   02/07/19 1353  Weight: (!) 190.5 kg    Exam:  General appearance: Awake alert.  In no distress.  Morbidly obese Resp: Normal  effort at rest.  Coarse breath sounds bilaterally with few crackles at the bases.  No wheezing or rhonchi.   Cardio: S1-S2 is normal regular.  No S3-S4.  No rubs murmurs or bruit GI: Abdomen is soft.  Nontender nondistended.  Bowel sounds are present normal.  No masses organomegaly Extremities: No edema.  Full range of motion of lower extremities. Neurologic: Alert and oriented x3.  No focal neurological deficits.      Data Reviewed:    Labs: Basic Metabolic Panel: Recent Labs  Lab 02/05/19 0628 02/07/19 0804 02/08/19 0410 02/09/19 0145 02/10/19 0135  NA 139 138 140 143 142  K 3.7 3.5 4.1 4.2 4.0  CL 104 104 108 109 108  CO2 24 22 27 27 27   GLUCOSE  120* 133* 124* 114* 103*  BUN 13 12 13 17 17   CREATININE 1.15 1.07 0.84 0.89 0.89  CALCIUM 8.8* 8.3* 8.4* 8.6* 8.6*   GFR Estimated Creatinine Clearance: 228.2 mL/min (by C-G formula based on SCr of 0.89 mg/dL).   Liver Function Tests: Recent Labs  Lab 02/05/19 0628 02/07/19 0804 02/08/19 0410 02/09/19 0145 02/10/19 0135  AST 70* 66* 76* 53* 114*  ALT 82* 55* 85* 81* 138*  ALKPHOS 69 51 53 51 48  BILITOT 1.1 1.1 1.1 0.9 0.7  PROT 7.8 7.1 7.2 6.8 6.7  ALBUMIN 3.8 3.3* 3.1* 3.2* 3.0*   Recent Labs  Lab 02/05/19 0628  LIPASE 19   COVID-19 Labs  Recent Labs    02/08/19 0410 02/09/19 0145 02/10/19 0135  DDIMER 1.05* 0.73* 0.61*  FERRITIN 187 186 230  CRP 4.3* 1.5* 0.8    Lab Results  Component Value Date   SARSCOV2NAA POSITIVE (A) 02/05/2019   SARSCOV2NAA Not Detected 01/28/2019    CBC: Recent Labs  Lab 02/05/19 0628 02/07/19 0804 02/08/19 0410 02/09/19 0145 02/10/19 0135  WBC 2.6* 6.2 4.3 4.5 5.8  NEUTROABS 1.6* 5.3 3.4 3.2 3.4  HGB 12.6* 12.6* 12.0* 12.1* 11.8*  HCT 39.0 39.0 37.8* 38.6* 37.3*  MCV 72.1* 72.2* 73.7* 75.2* 74.5*  PLT 217 237 201 208 226   CBG: Recent Labs  Lab 02/08/19 1946 02/09/19 0730 02/09/19 1139 02/09/19 1523 02/09/19 2025  GLUCAP 132* 108* 95 104* 152*   D-Dimer: Recent Labs    02/09/19 0145 02/10/19 0135  DDIMER 0.73* 0.61*   Hgb A1c: Recent Labs    02/07/19 1535  HGBA1C 5.7*    Anemia work up: Recent Labs    02/09/19 0145 02/10/19 0135  VITAMINB12  --  522  FOLATE  --  7.5  FERRITIN 186 230  TIBC  --  215*  IRON  --  61  RETICCTPCT  --  1.5   Sepsis Labs: Recent Labs  Lab 02/07/19 0804 02/07/19 0805 02/07/19 1535 02/08/19 0410 02/09/19 0145 02/10/19 0135  PROCALCITON  --  <0.10 <0.10  --   --   --   WBC 6.2  --   --  4.3 4.5 5.8   Microbiology Recent Results (from the past 240 hour(s))  SARS Coronavirus 2 [facility] order, Performed in [place] hospital lab)     Status: Abnormal    Collection Time: 02/05/19  8:40 AM  Result Value Ref Range Status   SARS Coronavirus 2 POSITIVE (A) NEGATIVE Final    Comment: RESULT CALLED TO, READ BACK BY AND VERIFIED WITH: BRANDY DAVIS AT 1307 02/05/2019.PMF (NOTE) If result is NEGATIVE SARS-CoV-2 target nucleic acids are NOT DETECTED. The SARS-CoV-2 RNA is generally detectable in upper  and lower  respiratory specimens during the acute phase of infection. The lowest  concentration of SARS-CoV-2 viral copies this assay can detect is 250  copies / mL. A negative result does not preclude SARS-CoV-2 infection  and should not be used as the sole basis for treatment or other  patient management decisions.  A negative result may occur with  improper specimen collection / handling, submission of specimen other  than nasopharyngeal swab, presence of viral mutation(s) within the  areas targeted by this assay, and inadequate number of viral copies  (<250 copies / mL). A negative result must be combined with clinical  observations, patient history, and epidemiological information. If result is POSITIVE SARS-CoV-2 target nucleic acids are DETECTED. Th e SARS-CoV-2 RNA is generally detectable in upper and lower  respiratory specimens during the acute phase of infection.  Positive  results are indicative of active infection with SARS-CoV-2.  Clinical  correlation with patient history and other diagnostic information is  necessary to determine patient infection status.  Positive results do  not rule out bacterial infection or co-infection with other viruses. If result is PRESUMPTIVE POSTIVE SARS-CoV-2 nucleic acids MAY BE PRESENT.   A presumptive positive result was obtained on the submitted specimen  and confirmed on repeat testing.  While 2019 novel coronavirus  (SARS-CoV-2) nucleic acids may be present in the submitted sample  additional confirmatory testing may be necessary for epidemiological  and / or clinical management purposes  to  differentiate between  SARS-CoV-2 and other Sarbecovirus currently known to infect humans.  If clinically indicated additional testing with an alternate test  methodology [phone]) is  advised. The SARS-CoV-2 RNA is generally  detectable in upper and lower respiratory specimens during the acute  phase of infection. The expected result is Negative. Fact Sheet for Patients:  BoilerBrush.com.cy Fact Sheet for Healthcare Providers: https://pope.com/ This test is not yet approved or cleared by the [place] FDA and has been authorized for detection and/or diagnosis of SARS-CoV-2 by FDA under an Emergency Use Authorization (EUA).  This EUA will remain in effect (meaning this test can be used) for the duration of the COVID-19 declaration under Section 564(b)(1) of the Act, 21 U.S.C. section 360bbb-3(b)(1), unless the authorization is terminated or revoked sooner. Performed at [facility], [address] [place], [place] [address]      Medications:   . albuterol  2 puff Inhalation Q4H  . dexamethasone  6 mg Oral Daily  . enoxaparin (LOVENOX) injection  100 mg Subcutaneous Q24H  . fluticasone  2 spray Each Nare Daily  . insulin aspart  0-20 Units Subcutaneous TID WC  . insulin aspart  0-5 Units Subcutaneous QHS  . montelukast  10 mg Oral QHS  . sodium chloride flush  3 mL Intravenous Q12H  . vitamin C  500 mg Oral Daily  . zinc sulfate  220 mg Oral Daily   Continuous Infusions: . sodium chloride    . remdesivir 100 mg in NS 250 mL 100 mg (02/09/19 1555)      LOS: 3 days   Osvaldo Shipper  [facility]  Pager: [email or web address]  02/10/2019, 11:12 AM

## 2019-02-11 LAB — GLUCOSE, CAPILLARY
Glucose-Capillary: 94 mg/dL (ref 70–99)
Glucose-Capillary: 110 mg/dL — ABNORMAL HIGH (ref 70–99)

## 2019-02-11 MED ORDER — DEXAMETHASONE 2 MG PO TABS: ORAL_TABLET | ORAL | 0 refills | Status: DC

## 2019-02-11 NOTE — Progress Notes (Signed)
Pt d/c from [place] via [place] to care of family in private vehicle. All personal belongings w pt at time of discharge, no further questions or concerns noted

## 2019-02-11 NOTE — Progress Notes (Signed)
SATURATION QUALIFICATIONS: (This note is used to comply with regulatory documentation for home oxygen)  Patient Saturations on Room Air at Rest = 100%  Patient Saturations on Room Air while Ambulating = 97%  Patient Saturations on 0 Liters of oxygen while Ambulating = 97%  Please briefly explain why patient needs home oxygen:  Patient does not need home oxygen at this time.

## 2019-02-11 NOTE — Discharge Instructions (Signed)

## 2019-02-11 NOTE — Discharge Summary (Signed)
[facility]  Physician Discharge Summary   Patient ID: [name] MRN: [id number] DOB/AGE: [birth year] 28 y.o.  Admit date: 02/07/2019 Discharge date: 02/11/2019  PCP: Alba Cory, MD  DISCHARGE DIAGNOSES:  Pneumonia due to COVID-19 Acute respiratory failure with hypoxia, resolved Orthostatic hypotension, resolved Hyperglycemia most likely due to steroids Transaminitis most likely due to COVID-19 Morbid obesity Microcytic anemia History of asthma  RECOMMENDATIONS FOR OUTPATIENT FOLLOW UP: 1. Outpatient work-up for microcytic anemia 2. Please check LFTs and glucose level in 2 to 3 weeks    Home Health: None Equipment/Devices: None  CODE STATUS: Full code  DISCHARGE CONDITION: fair  Diet recommendation: As before   INITIAL HISTORY: [name] is an 28 y.o. male with past medical history significant for asthma presented to the ED with shortness of breath that started on 01/27/2019 tested positive in the ED day of admission for COVID-19.  Was found to be hypoxic.  Was hospitalized for further management.    HOSPITAL COURSE:   Acute respiratory failure with hypoxia due to COVID-19 viral infection: Patient's respiratory status has improved.  He initially was requiring 6 L of oxygen.  Now he saturating normal on room air.  Inflammatory markers have improved.    Patient has completed course of Remdesivir.  Steroids will be tapered down.  Patient has been ambulating without any difficulties.  Okay for discharge home today.    Orthostatic hypotension This was likely due to hypovolemia.  He has been hydrated.  Feels better.  Has been ambulating.    Hyperglycemia This is in the setting of COVID-19 as well as steroids.  HbA1c is 5.7.    Check glucose level in the outpatient setting in the next few weeks.  Transaminitis Elevated LFTs are most likely due to COVID-19.  He will need this to be rechecked in the outpatient setting in a few weeks.  This was  communicated to the patient.    Morbid obesity Estimated body mass index is 51.12 kg/m as calculated from the following:   Height as of this encounter: 6\' 4"  (1.93 m).   Weight as of this encounter: 190.5 kg.  Asthma, mild intermittent: Continue home medications  Mild microcytic anemia Hemoglobin is stable.  No evidence of overt bleeding.  Anemia panel reviewed.  No deficiencies identified.  Outpatient evaluation.  Overall stable.  Okay for discharge home today.    PERTINENT LABS:  The results of significant diagnostics from this hospitalization (including imaging, microbiology, ancillary and laboratory) are listed below for reference.    Microbiology: Recent Results (from the past 240 hour(s))  SARS Coronavirus 2 [facility] order, Performed in [facility] hospital lab)     Status: Abnormal   Collection Time: 02/05/19  8:40 AM  Result Value Ref Range Status   SARS Coronavirus 2 POSITIVE (A) NEGATIVE Final    Comment: RESULT CALLED TO, READ BACK BY AND VERIFIED WITH: BRANDY DAVIS AT 1307 02/05/2019.PMF (NOTE) If result is NEGATIVE SARS-CoV-2 target nucleic acids are NOT DETECTED. The SARS-CoV-2 RNA is generally detectable in upper and lower  respiratory specimens during the acute phase of infection. The lowest  concentration of SARS-CoV-2 viral copies this assay can detect is 250  copies / mL. A negative result does not preclude SARS-CoV-2 infection  and should not be used as the sole basis for treatment or other  patient management decisions.  A negative result may occur with  improper specimen collection / handling, submission of specimen other  than nasopharyngeal swab, presence of viral mutation(s)  within the  areas targeted by this assay, and inadequate number of viral copies  (<250 copies / mL). A negative result must be combined with clinical  observations, patient history, and epidemiological information. If result is POSITIVE SARS-CoV-2 target nucleic acids are  DETECTED. Th e SARS-CoV-2 RNA is generally detectable in upper and lower  respiratory specimens during the acute phase of infection.  Positive  results are indicative of active infection with SARS-CoV-2.  Clinical  correlation with patient history and other diagnostic information is  necessary to determine patient infection status.  Positive results do  not rule out bacterial infection or co-infection with other viruses. If result is PRESUMPTIVE POSTIVE SARS-CoV-2 nucleic acids MAY BE PRESENT.   A presumptive positive result was obtained on the submitted specimen  and confirmed on repeat testing.  While 2019 novel coronavirus  (SARS-CoV-2) nucleic acids may be present in the submitted sample  additional confirmatory testing may be necessary for epidemiological  and / or clinical management purposes  to differentiate between  SARS-CoV-2 and other Sarbecovirus currently known to infect humans.  If clinically indicated additional testing with an alternate test  methodology [phone]) is  advised. The SARS-CoV-2 RNA is generally  detectable in upper and lower respiratory specimens during the acute  phase of infection. The expected result is Negative. Fact Sheet for Patients:  StrictlyIdeas.no Fact Sheet for Healthcare Providers: BankingDealers.co.za This test is not yet approved or cleared by the [place] FDA and has been authorized for detection and/or diagnosis of SARS-CoV-2 by FDA under an Emergency Use Authorization (EUA).  This EUA will remain in effect (meaning this test can be used) for the duration of the COVID-19 declaration under Section 564(b)(1) of the Act, 21 U.S.C. section 360bbb-3(b)(1), unless the authorization is terminated or revoked sooner. Performed at [facility], [address] [place], [place] [address]      Labs: Basic Metabolic Panel: Recent Labs  Lab 02/05/19 0628 02/07/19 0804 02/08/19 0410  02/09/19 0145 02/10/19 0135  NA 139 138 140 143 142  K 3.7 3.5 4.1 4.2 4.0  CL 104 104 108 109 108  CO2 24 22 27 27 27   GLUCOSE 120* 133* 124* 114* 103*  BUN 13 12 13 17 17   CREATININE 1.15 1.07 0.84 0.89 0.89  CALCIUM 8.8* 8.3* 8.4* 8.6* 8.6*   Liver Function Tests: Recent Labs  Lab 02/05/19 0628 02/07/19 0804 02/08/19 0410 02/09/19 0145 02/10/19 0135  AST 70* 66* 76* 53* 114*  ALT 82* 55* 85* 81* 138*  ALKPHOS 69 51 53 51 48  BILITOT 1.1 1.1 1.1 0.9 0.7  PROT 7.8 7.1 7.2 6.8 6.7  ALBUMIN 3.8 3.3* 3.1* 3.2* 3.0*   Recent Labs  Lab 02/05/19 0628  LIPASE 19   CBC: Recent Labs  Lab 02/05/19 0628 02/07/19 0804 02/08/19 0410 02/09/19 0145 02/10/19 0135  WBC 2.6* 6.2 4.3 4.5 5.8  NEUTROABS 1.6* 5.3 3.4 3.2 3.4  HGB 12.6* 12.6* 12.0* 12.1* 11.8*  HCT 39.0 39.0 37.8* 38.6* 37.3*  MCV 72.1* 72.2* 73.7* 75.2* 74.5*  PLT 217 237 201 208 226   BNP: BNP (last 3 results) Recent Labs    02/07/19 0804  BNP 8.0    CBG: Recent Labs  Lab 02/10/19 1134 02/10/19 1609 02/10/19 2117 02/11/19 0745 02/11/19 1135  GLUCAP 102* 105* 117* 94 110*     IMAGING STUDIES Dg Chest Portable 1 View  Result Date: 02/07/2019 CLINICAL DATA:  Shortness of breath.  Positive COVID-19 EXAM: PORTABLE CHEST 1  VIEW COMPARISON:  February 05, 2019. FINDINGS: There is airspace opacity throughout both lower lung regions as well as the left upper lobe, slightly progressed from 2 days prior. Heart is borderline enlarged with pulmonary vascularity normal. No adenopathy. No bone lesions. IMPRESSION: Multifocal pneumonia, marginally progressed from 2 days prior. Stable cardiac prominence. No evident adenopathy. Electronically Signed   By: Bretta Bang III M.D.   On: 02/07/2019 08:07   Dg Chest Portable 1 View  Result Date: 02/05/2019 CLINICAL DATA:  Cough 1 week which shortness-of-breath and decreased urine output. EXAM: PORTABLE CHEST 1 VIEW COMPARISON:  12/08/2014 FINDINGS: Lungs are  adequately inflated with mild patchy bilateral opacification. No effusion. Cardiomediastinal silhouette and remainder of the exam is unchanged. IMPRESSION: Mild patchy bilateral airspace opacification concerning for infection. Electronically Signed   By: Elberta Fortis M.D.   On: 02/05/2019 08:43    DISCHARGE EXAMINATION: Vitals:   02/10/19 1600 02/10/19 1913 02/11/19 0357 02/11/19 0748  BP: 140/70 102/78 135/85 122/80  Pulse: 76 69 65 79  Resp: 16   17  Temp: 98.5 F (36.9 C) 98.2 F (36.8 C) 98.8 F (37.1 C) 98.7 F (37.1 C)  TempSrc: Oral Oral Oral Oral  SpO2: 100% 100% 100% 100%  Weight:      Height:       General appearance: Awake alert.  In no distress Resp: Clear to auscultation bilaterally.  Normal effort Cardio: S1-S2 is normal regular.  No S3-S4.  No rubs murmurs or bruit GI: Abdomen is soft.  Nontender nondistended.  Bowel sounds are present normal.  No masses organomegaly Extremities: No edema.  Full range of motion of lower extremities. Neurologic: Alert and oriented x3.  No focal neurological deficits.    DISPOSITION: Home  Discharge Instructions    Call MD for:  difficulty breathing, headache or visual disturbances   Complete by: As directed    Call MD for:  extreme fatigue   Complete by: As directed    Call MD for:  persistant dizziness or light-headedness   Complete by: As directed    Call MD for:  persistant nausea and vomiting   Complete by: As directed    Call MD for:  severe uncontrolled pain   Complete by: As directed    Call MD for:  temperature >100.4   Complete by: As directed    Diet general   Complete by: As directed    Discharge instructions   Complete by: As directed    Please be sure to follow-up with your primary care provider.  You will need to have your glucose level and liver function checked in 2 to 3 weeks to make sure they have returned to normal.  Please take your medications as prescribed.  Follow the COVID-19 instructions  below.  COVID 19 INSTRUCTIONS  - You are felt to be stable enough to no longer require inpatient monitoring, testing, and treatment, though you will need to follow the recommendations below: - Based on the CDC's non-test criteria for ending self-isolation: You may not return to work/leave the home until at least 21 days since symptom onset AND 3 days without a fever (without taking tylenol, ibuprofen, etc.) AND have improvement in respiratory symptoms. - Do not take NSAID medications (including, but not limited to, ibuprofen, advil, motrin, naproxen, aleve, goody's powder, etc.) - Follow up with your doctor in the next week via telehealth or seek medical attention right away if your symptoms get WORSE.  - Consider donating plasma after you have  recovered (either 14 days after a negative test or 28 days after symptoms have completely resolved) because your antibodies to this virus may be helpful to give to others with life-threatening infections. Please go to the website www.[email or web address] if you would like to consider volunteering for plasma donation.    Directions for you at home:  Wear a facemask You should wear a facemask that covers your nose and mouth when you are in the same room with other people and when you visit a healthcare provider. People who live with or visit you should also wear a facemask while they are in the same room with you.  Separate yourself from other people in your home As much as possible, you should stay in a different room from other people in your home. Also, you should use a separate bathroom, if available.  Avoid sharing household items You should not share dishes, drinking glasses, cups, eating utensils, towels, bedding, or other items with other people in your home. After using these items, you should wash them thoroughly with soap and water.  Cover your coughs and sneezes Cover your mouth and nose with a tissue when you cough or sneeze, or you can cough or  sneeze into your sleeve. Throw used tissues in a lined trash can, and immediately wash your hands with soap and water for at least 20 seconds or use an alcohol-based hand rub.  Wash your [employer] Corporationhands Wash your hands often and thoroughly with soap and water for at least 20 seconds. You can use an alcohol-based hand sanitizer if soap and water are not available and if your hands are not visibly dirty. Avoid touching your eyes, nose, and mouth with unwashed hands.  Directions for those who live with, or provide care at home for you:  Limit the number of people who have contact with the patient If possible, have only one caregiver for the patient. Other household members should stay in another home or place of residence. If this is not possible, they should stay in another room, or be separated from the patient as much as possible. Use a separate bathroom, if available. Restrict visitors who do not have an essential need to be in the home.  Ensure good ventilation Make sure that shared spaces in the home have good air flow, such as from an air conditioner or an opened window, weather permitting.  Wash your hands often Wash your hands often and thoroughly with soap and water for at least 20 seconds. You can use an alcohol based hand sanitizer if soap and water are not available and if your hands are not visibly dirty. Avoid touching your eyes, nose, and mouth with unwashed hands. Use disposable paper towels to dry your hands. If not available, use dedicated cloth towels and replace them when they become wet.  Wear a facemask and gloves Wear a disposable facemask at all times in the room and gloves when you touch or have contact with the patient's blood, body fluids, and/or secretions or excretions, such as sweat, saliva, sputum, nasal mucus, vomit, urine, or feces.  Ensure the mask fits over your nose and mouth tightly, and do not touch it during use. Throw out disposable facemasks and gloves after  using them. Do not reuse. Wash your hands immediately after removing your facemask and gloves. If your personal clothing becomes contaminated, carefully remove clothing and launder. Wash your hands after handling contaminated clothing. Place all used disposable facemasks, gloves, and other waste in a lined container before  disposing them with other household waste. Remove gloves and wash your hands immediately after handling these items.  Do not share dishes, glasses, or other household items with the patient Avoid sharing household items. You should not share dishes, drinking glasses, cups, eating utensils, towels, bedding, or other items with a patient who is confirmed to have, or being evaluated for, COVID-19 infection. After the person uses these items, you should wash them thoroughly with soap and water.  Wash laundry thoroughly Immediately remove and wash clothes or bedding that have blood, body fluids, and/or secretions or excretions, such as sweat, saliva, sputum, nasal mucus, vomit, urine, or feces, on them. Wear gloves when handling laundry from the patient. Read and follow directions on labels of laundry or clothing items and detergent. In general, wash and dry with the warmest temperatures recommended on the label.  Clean all areas the individual has used often Clean all touchable surfaces, such as counters, tabletops, doorknobs, bathroom fixtures, toilets, phones, keyboards, tablets, and bedside tables, every day. Also, clean any surfaces that may have blood, body fluids, and/or secretions or excretions on them. Wear gloves when cleaning surfaces the patient has come in contact with. Use a diluted bleach solution (e.g., dilute bleach with 1 part bleach and 10 parts water) or a household disinfectant with a label that says EPA-registered for coronaviruses. To make a bleach solution at home, add 1 tablespoon of bleach to 1 quart (4 cups) of water. For a larger supply, add  cup of bleach  to 1 gallon (16 cups) of water. Read labels of cleaning products and follow recommendations provided on product labels. Labels contain instructions for safe and effective use of the cleaning product including precautions you should take when applying the product, such as wearing gloves or eye protection and making sure you have good ventilation during use of the product. Remove gloves and wash hands immediately after cleaning.  Monitor yourself for signs and symptoms of illness Caregivers and household members are considered close contacts, should monitor their health, and will be asked to limit movement outside of the home to the extent possible. Follow the monitoring steps for close contacts listed on the symptom monitoring form.   If you have additional questions, contact your local health department or call the epidemiologist on call at [phone] (available 24/7). This guidance is subject to change. For the most up-to-date guidance from [facility], please refer to their website: TripMetro.hu   You were cared for by a hospitalist during your hospital stay. If you have any questions about your discharge medications or the care you received while you were in the hospital after you are discharged, you can call the unit and asked to speak with the hospitalist on call if the hospitalist that took care of you is not available. Once you are discharged, your primary care physician will handle any further medical issues. Please note that NO REFILLS for any discharge medications will be authorized once you are discharged, as it is imperative that you return to your primary care physician (or establish a relationship with a primary care physician if you do not have one) for your aftercare needs so that they can reassess your need for medications and monitor your lab values. If you do not have a primary care physician, you can call [phone] for a  physician referral.   Increase activity slowly   Complete by: As directed        Allergies as of 02/11/2019   No Known Allergies  Medication List    STOP taking these medications   azithromycin 250 MG tablet Commonly known as: Zithromax Z-Pak   predniSONE 10 MG tablet Commonly known as: DELTASONE     TAKE these medications   albuterol 108 (90 Base) MCG/ACT inhaler Commonly known as: VENTOLIN HFA TAKE 2 PUFFS BY MOUTH EVERY 6 HOURS AS NEEDED FOR WHEEZE OR SHORTNESS OF BREATH What changed: See the new instructions.   azelastine 0.1 % nasal spray Commonly known as: ASTELIN Place 2 sprays into both nostrils 2 (two) times daily. Use in each nostril as directed   dexamethasone 2 MG tablet Commonly known as: DECADRON Take 2 tablets once daily for 3 days followed by 1 tablet once daily for 3 days and then stop   fluticasone 50 MCG/ACT nasal spray Commonly known as: FLONASE Place 2 sprays into both nostrils daily.   ibuprofen 200 MG tablet Commonly known as: ADVIL Take 600 mg by mouth every 6 (six) hours as needed for fever or moderate pain.   loratadine 10 MG tablet Commonly known as: CLARITIN Take 1 tablet (10 mg total) by mouth daily.   montelukast 10 MG tablet Commonly known as: SINGULAIR Take 1 tablet (10 mg total) by mouth at bedtime.        Follow-up Information    Alba CorySowles, Krichna, MD. Schedule an appointment as soon as possible for a visit in 1 week(s).   Specialty: Family Medicine Why: Will need LFTs and glucose level checked in 2 to 3 weeks. Contact information: [address] [place] [address] [phone]           TOTAL DISCHARGE TIME: 35 minutes  Star Resler Rito EhrlichKrishnan  [facility] Pager on [email or web address]  02/11/2019, 2:15 PM

## 2019-02-14 ENCOUNTER — Telehealth: Payer: Self-pay

## 2019-02-14 NOTE — Telephone Encounter (Signed)
Transition Care Management Follow-up Telephone Call  Date of discharge and from where: 9/18 [facility]   How have you been since you were released from the hospital? Pt states he is feeling better but not 100%. He denies pain or shortness of breath.    Any questions or concerns? No   Items Reviewed:  Did the pt receive and understand the discharge instructions provided? Yes   Medications obtained and verified? Yes   Any new allergies since your discharge? No   Dietary orders reviewed? Yes  Do you have support at home? Yes   Functional Questionnaire: (I = Independent and D = Dependent) ADLs: I  Bathing/Dressing- I  Meal Prep- I  Eating- I  Maintaining continence- I  Transferring/Ambulation- I  Managing Meds- I  Follow up appointments reviewed:   PCP Hospital f/u appt confirmed? Yes  Scheduled to see Dr. Ancil Boozer on 9/22 @ 8:00 virtual only .  Are transportation arrangements needed? No   If their condition worsens, is the pt aware to call PCP or go to the Emergency Dept.? Yes  Was the patient provided with contact information for the PCP's office or ED? Yes  Was to pt encouraged to call back with questions or concerns? Yes

## 2019-02-15 ENCOUNTER — Encounter: Payer: Self-pay | Admitting: Family Medicine

## 2019-02-15 ENCOUNTER — Ambulatory Visit (INDEPENDENT_AMBULATORY_CARE_PROVIDER_SITE_OTHER): Payer: 59 | Admitting: Family Medicine

## 2019-02-15 DIAGNOSIS — U071 COVID-19: Secondary | ICD-10-CM | POA: Diagnosis not present

## 2019-02-15 DIAGNOSIS — J4520 Mild intermittent asthma, uncomplicated: Secondary | ICD-10-CM

## 2019-02-15 DIAGNOSIS — Z09 Encounter for follow-up examination after completed treatment for conditions other than malignant neoplasm: Secondary | ICD-10-CM | POA: Diagnosis not present

## 2019-02-15 DIAGNOSIS — J1289 Other viral pneumonia: Secondary | ICD-10-CM

## 2019-02-15 DIAGNOSIS — J1282 Pneumonia due to coronavirus disease 2019: Secondary | ICD-10-CM

## 2019-02-15 MED ORDER — BENZONATATE 100 MG PO CAPS: 100.0000 mg | ORAL_CAPSULE | Freq: Two times a day (BID) | ORAL | 0 refills | Status: DC | PRN

## 2019-02-15 MED ORDER — FLUTICASONE-SALMETEROL 250-50 MCG/DOSE IN AEPB: 1.0000 | INHALATION_SPRAY | Freq: Two times a day (BID) | RESPIRATORY_TRACT | 0 refills | Status: DC

## 2019-02-15 NOTE — Progress Notes (Signed)
Name: [name]   MRN: [id number]    DOB: January 26, 1991   Date:02/15/2019       Progress Note  Subjective  Chief Complaint  Chief Complaint  Patient presents with  . Hospitalization Follow-up    COVID. Discharged 02/11/2019    I connected with  [name]  on 02/15/19 at  8:00 AM EDT by a video enabled telemedicine application and verified that I am speaking with the correct person using two identifiers.  I discussed the limitations of evaluation and management by telemedicine and the availability of in person appointments. The patient expressed understanding and agreed to proceed. Staff also discussed with the patient that there may be a patient responsible charge related to this service. Patient Location: at home  Physician  Location: [facility]   HPI  Hospital Discharge Follow up: He was exposed to his sister who had COVID-19 in the beginning of the month, around the 6 th he developed a cough and on the 12 th he went to [facility] had CXR that showed patchy bilateral opacification and was tested for COVID-19, he was given fluids and steroids and was sent home, but on the 14 th he felt very hot and went to take a cold shower, he felt weak and got out, he woke up on the floor he yelled for help and mother called 911 and he was transported to [facility], CXR showed mild progression with multifocal pneumonia. He was transferred to [facility] for admission, D-dimer, ferritin and CRP were initially elevated. He was sent home on medrol dose pack and states he has been feeling better, still has nasal congestion and has noticed some blood mixed in his nose secretion ( using flonase), and still has a mild cough at night. He has also noticed difficulty staying asleep during the night, even though he is not taking a nap. He denies sob or wheezing at this time. He has a physical job at work, we will allow him to return to work Wednesday 02/23/2019 without restrictions but explained that  he needs to start going for walks daily to improve his stamina. Monitor for SOB during activity. Explained COVID-19 can cause cardiomyopathy and nerve damage and to return in one month for follow up and see how he is doing       Patient Active Problem List   Diagnosis Date Noted  . COVID-19 virus infection 02/07/2019  . Acute respiratory failure with hypoxia (HCC) 02/07/2019  . Orthostatic hypotension 02/07/2019  . Asthma, mild intermittent   . Morbid obesity (HCC) 02/08/2008    No past surgical history on file.  Family History  Problem Relation Age of Onset  . Obesity Mother   . Kidney disease Father        mass  . Liver cancer Father   . Obesity Sister     Social History   Socioeconomic History  . Marital status: Single    Spouse name: Not on file  . Number of children: 0  . Years of education: Not on file  . Highest education level: Some college, no degree  Occupational History  . Occupation: Management consultant   Social Needs  . Financial resource strain: Not hard at all  . Food insecurity    Worry: Never true    Inability: Never true  . Transportation needs    Medical: No    Non-medical: No  Tobacco Use  . Smoking status: Never Smoker  . Smokeless tobacco: Never Used  Substance and Sexual Activity  . Alcohol use: Yes    Alcohol/week: 1.0 standard drinks    Types: 1 Cans of beer per week  . Drug use: Not Currently  . Sexual activity: Yes    Partners: Female    Comment: not always   Lifestyle  . Physical activity    Days per week: Not on file    Minutes per session: Not on file  . Stress: Not on file  Relationships  . Social Musician on phone: Not on file    Gets together: Not on file    Attends religious service: Not on file    Active member of club or organization: Not on file    Attends meetings of clubs or organizations: Not on file    Relationship status: Not on file  . Intimate partner violence    Fear of current or ex  partner: No    Emotionally abused: No    Physically abused: No    Forced sexual activity: No  Other Topics Concern  . Not on file  Social History Narrative  . Not on file     Current Outpatient Medications:  .  albuterol (PROVENTIL HFA;VENTOLIN HFA) 108 (90 Base) MCG/ACT inhaler, TAKE 2 PUFFS BY MOUTH EVERY 6 HOURS AS NEEDED FOR WHEEZE OR SHORTNESS OF BREATH (Patient taking differently: Inhale 2 puffs into the lungs every 6 (six) hours as needed for wheezing or shortness of breath. ), Disp: 18 Inhaler, Rfl: 2 .  azelastine (ASTELIN) 0.1 % nasal spray, Place 2 sprays into both nostrils 2 (two) times daily. Use in each nostril as directed, Disp: 30 mL, Rfl: 2 .  dexamethasone (DECADRON) 2 MG tablet, Take 2 tablets once daily for 3 days followed by 1 tablet once daily for 3 days and then stop, Disp: 9 tablet, Rfl: 0 .  fluticasone (FLONASE) 50 MCG/ACT nasal spray, Place 2 sprays into both nostrils daily., Disp: 16 g, Rfl: 2 .  ibuprofen (ADVIL) 200 MG tablet, Take 600 mg by mouth every 6 (six) hours as needed for fever or moderate pain., Disp: , Rfl:  .  loratadine (CLARITIN) 10 MG tablet, Take 1 tablet (10 mg total) by mouth daily., Disp: 30 tablet, Rfl: 3 .  montelukast (SINGULAIR) 10 MG tablet, Take 1 tablet (10 mg total) by mouth at bedtime., Disp: 90 tablet, Rfl: 0  No Known Allergies  I personally reviewed active problem list, medication list, allergies, family history, social history with the patient/caregiver today.   ROS  Ten systems reviewed and is negative except as mentioned in HPI   Objective  Virtual encounter, vitals not obtained.  There is no height or weight on file to calculate BMI.  Physical Exam  Awake, alert and oriented, no respiratory distress, speaking in full sentences.    PHQ2/9: Depression screen [facility] 2/9 02/15/2019 01/28/2019 09/20/2018 04/19/2018  Decreased Interest 0 0 0 0  Down, Depressed, Hopeless 0 0 0 0  PHQ - 2 Score 0 0 0 0  Altered sleeping 0  0 1 -  Tired, decreased energy 0 0 0 -  Change in appetite 0 0 0 -  Feeling bad or failure about yourself  0 0 0 -  Trouble concentrating 0 0 0 -  Moving slowly or fidgety/restless 0 0 0 -  Suicidal thoughts 0 0 0 -  PHQ-9 Score 0 0 1 -  Difficult doing work/chores Not difficult at all Not difficult at all Not difficult at all -  PHQ-2/9 Result is negative.    Fall Risk: Fall Risk  02/15/2019 01/28/2019 09/20/2018 04/19/2018  Falls in the past year? 0 0 0 0  Number falls in past yr: 0 0 0 0  Injury with Fall? 0 0 0 0     Assessment & Plan  1. Pneumonia due to COVID-19 virus  - benzonatate (TESSALON) 100 MG capsule; Take 1-2 capsules (100-200 mg total) by mouth 2 (two) times daily as needed.  Dispense: 40 capsule; Refill: 0  2. Mild intermittent asthma without complication  - Fluticasone-Salmeterol (ADVAIR DISKUS) 250-50 MCG/DOSE AEPB; Inhale 1 puff into the lungs 2 (two) times daily.  Dispense: 60 each; Refill: 0  3. Hospital discharge follow-up  Reviewed labs with patient, explained need to repeat labs on his next follow up, we will also give him PCV 23 and flu vaccine during his next visit. Increase protein intake since albumin was low  I discussed the assessment and treatment plan with the patient. The patient was provided an opportunity to ask questions and all were answered. The patient agreed with the plan and demonstrated an understanding of the instructions.  The patient was advised to call back or seek an in-person evaluation if the symptoms worsen or if the condition fails to improve as anticipated.  I provided 25  minutes of non-face-to-face time during this encounter.

## 2019-02-18 ENCOUNTER — Encounter: Payer: Self-pay | Admitting: Family Medicine

## 2019-02-21 ENCOUNTER — Encounter: Payer: Self-pay | Admitting: Family Medicine

## 2019-02-21 ENCOUNTER — Telehealth: Payer: Self-pay

## 2019-02-21 NOTE — Telephone Encounter (Signed)
Copied from [place] [phone]. 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 Pt needs the note to state return to work date 02-23-2019  I sent his letter on 02/15/2019.

## 2019-02-21 NOTE — Telephone Encounter (Signed)
Letter has been sent to [facility].

## 2019-02-21 NOTE — Telephone Encounter (Signed)
Copied from [place] [id number]. 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 Pt needs the note to state return to work date 02-23-2019

## 2020-01-25 ENCOUNTER — Ambulatory Visit: Payer: 59 | Admitting: Family Medicine

## 2020-04-22 ENCOUNTER — Encounter: Payer: Self-pay | Admitting: Family Medicine

## 2020-04-23 ENCOUNTER — Telehealth (INDEPENDENT_AMBULATORY_CARE_PROVIDER_SITE_OTHER): Payer: 59 | Admitting: Family Medicine

## 2020-04-23 ENCOUNTER — Encounter: Payer: Self-pay | Admitting: Family Medicine

## 2020-04-23 VITALS — Ht 76.0 in

## 2020-04-23 DIAGNOSIS — J329 Chronic sinusitis, unspecified: Secondary | ICD-10-CM

## 2020-04-23 DIAGNOSIS — J310 Chronic rhinitis: Secondary | ICD-10-CM | POA: Diagnosis not present

## 2020-04-23 DIAGNOSIS — J4521 Mild intermittent asthma with (acute) exacerbation: Secondary | ICD-10-CM

## 2020-04-23 MED ORDER — ALBUTEROL SULFATE HFA 108 (90 BASE) MCG/ACT IN AERS: 2.0000 | INHALATION_SPRAY | RESPIRATORY_TRACT | 2 refills | Status: DC | PRN

## 2020-04-23 MED ORDER — MONTELUKAST SODIUM 10 MG PO TABS: 10.0000 mg | ORAL_TABLET | Freq: Every day | ORAL | 0 refills | Status: DC

## 2020-04-23 MED ORDER — BENZONATATE 100 MG PO CAPS: 100.0000 mg | ORAL_CAPSULE | Freq: Two times a day (BID) | ORAL | 0 refills | Status: DC | PRN

## 2020-04-23 MED ORDER — PREDNISONE 20 MG PO TABS: 40.0000 mg | ORAL_TABLET | Freq: Every day | ORAL | 0 refills | Status: AC

## 2020-04-23 NOTE — Progress Notes (Signed)
Name: [name]   MRN: [id number]    DOB: 23-Sep-1990   Date:04/23/2020       Progress Note  Subjective:    Chief Complaint  Chief Complaint  Patient presents with  . Cough    x 1 week, productive  . Nasal Congestion    I connected with  [name]  on 04/23/20 at 11:20 AM EST by a video enabled telemedicine application and verified that I am speaking with the correct person using two identifiers.  I discussed the limitations of evaluation and management by telemedicine and the availability of in person appointments. The patient expressed understanding and agreed to proceed. Staff also discussed with the patient that there may be a patient responsible charge related to this service. Patient Location:   home Provider Location: [facility] Additional Individuals present: none  HPI One week of congestion and cough which has been gradually improving  Congestion head and sinuses, sneezing, a lot of nasal discharge  No CP SOB wheeze Hx of asthma Had COVID last year Sept - that was the last time he needed steroids or an inhaler. Using flonase, allegra, mucinex Getting COVID tested tomorrow  Patient denies fever, chills, sweats, sore throat, facial pain or pressure, headaches, nausea, vomiting, diarrhea, no sick contacts, he suspects it is allergies, he is generally feeling better   Patient Active Problem List   Diagnosis Date Noted  . COVID-19 virus infection 02/07/2019  . Asthma, mild intermittent   . Morbid obesity (HCC) 02/08/2008    Social History   Tobacco Use  . Smoking status: Never Smoker  . Smokeless tobacco: Never Used  Substance Use Topics  . Alcohol use: Yes    Alcohol/week: 1.0 standard drink    Types: 1 Cans of beer per week     Current Outpatient Medications:  .  albuterol (PROVENTIL HFA;VENTOLIN HFA) 108 (90 Base) MCG/ACT inhaler, TAKE 2 PUFFS BY MOUTH EVERY 6 HOURS AS NEEDED FOR WHEEZE OR SHORTNESS OF BREATH (Patient taking differently: Inhale 2  puffs into the lungs every 6 (six) hours as needed for wheezing or shortness of breath. ), Disp: 18 Inhaler, Rfl: 2 .  azelastine (ASTELIN) 0.1 % nasal spray, Place 2 sprays into both nostrils 2 (two) times daily. Use in each nostril as directed, Disp: 30 mL, Rfl: 2 .  benzonatate (TESSALON) 100 MG capsule, Take 1-2 capsules (100-200 mg total) by mouth 2 (two) times daily as needed., Disp: 40 capsule, Rfl: 0 .  dexamethasone (DECADRON) 2 MG tablet, Take 2 tablets once daily for 3 days followed by 1 tablet once daily for 3 days and then stop, Disp: 9 tablet, Rfl: 0 .  fluticasone (FLONASE) 50 MCG/ACT nasal spray, Place 2 sprays into both nostrils daily., Disp: 16 g, Rfl: 2 .  Fluticasone-Salmeterol (ADVAIR DISKUS) 250-50 MCG/DOSE AEPB, Inhale 1 puff into the lungs 2 (two) times daily., Disp: 60 each, Rfl: 0 .  ibuprofen (ADVIL) 200 MG tablet, Take 600 mg by mouth every 6 (six) hours as needed for fever or moderate pain., Disp: , Rfl:  .  loratadine (CLARITIN) 10 MG tablet, Take 1 tablet (10 mg total) by mouth daily., Disp: 30 tablet, Rfl: 3 .  montelukast (SINGULAIR) 10 MG tablet, Take 1 tablet (10 mg total) by mouth at bedtime., Disp: 90 tablet, Rfl: 0  No Known Allergies  I personally reviewed active problem list, medication list, allergies, family history, social history, health maintenance, notes from last encounter, lab results, imaging with the patient/caregiver today.  Review of Systems  Constitutional: Negative.  Negative for activity change, appetite change, chills, diaphoresis, fatigue and fever.  HENT: Positive for congestion, postnasal drip and sneezing. Negative for dental problem, drooling, ear discharge, ear pain, facial swelling, sinus pressure, sinus pain, sore throat, trouble swallowing and voice change.   Eyes: Negative.   Respiratory: Negative.   Cardiovascular: Negative.  Negative for chest pain, palpitations and leg swelling.  Gastrointestinal: Negative.   Endocrine:  Negative.   Genitourinary: Negative.   Musculoskeletal: Negative.   Skin: Negative.   Allergic/Immunologic: Negative.   Neurological: Negative.   Hematological: Negative.   Psychiatric/Behavioral: Negative.   All other systems reviewed and are negative.     Objective:   Virtual encounter, vitals limited, only able to obtain the following Today's Vitals   04/23/20 1044  Height: 6\' 4"  (1.93 m)   Body mass index is 51.12 kg/m. Nursing Note and Vital Signs reviewed.  Physical Exam Vitals and nursing note reviewed.  Pulmonary:     Effort: No respiratory distress.  Neurological:     Mental Status: He is alert.  Psychiatric:        Mood and Affect: Mood normal.        Behavior: Behavior normal.     PE limited by telephone encounter  No results found for this or any previous visit (from the past 72 hour(s)).  Assessment and Plan:     ICD-10-CM   1. Rhinosinusitis  J31.0 montelukast (SINGULAIR) 10 MG tablet   J32.9 benzonatate (TESSALON) 100 MG capsule   pt suspects worsening seasonal allergies, continue flonase, allegra, singulair, COVID test tomorrow, no fever sweats fatigue  2. Mild intermittent asthma with acute exacerbation  J45.21 montelukast (SINGULAIR) 10 MG tablet    albuterol (VENTOLIN HFA) 108 (90 Base) MCG/ACT inhaler    predniSONE (DELTASONE) 20 MG tablet   start steroid burst is asthma sx or cough worsen and use albuterol inhaler PRN, continue other meds above plus tessalon, mucinex and OTC cough meds     -Red flags and when to present for emergency care or RTC including fever >101.99F, chest pain, shortness of breath, new/worsening/un-resolving symptoms, reviewed with patient at time of visit. Follow up and care instructions discussed and provided in AVS. - I discussed the assessment and treatment plan with the patient. The patient was provided an opportunity to ask questions and all were answered. The patient agreed with the plan and demonstrated an  understanding of the instructions.  I provided 20+ minutes of non-face-to-face time during this encounter.  , PA-C 04/23/20 11:41 AM

## 2020-04-29 ENCOUNTER — Encounter: Payer: Self-pay | Admitting: Family Medicine

## 2020-04-30 ENCOUNTER — Telehealth: Payer: Self-pay | Admitting: Family Medicine

## 2020-04-30 NOTE — Telephone Encounter (Signed)
Patient is calling to report that he is still not feeling better after taking the benzonatate (TESSALON) 100 MG capsule [[id number]] . Patient had virtual appt on 04/23/20. Patient also had negative COVID test. Patient is calling to request some advice in hopes that he will feel better soon. Please advise Cb-  [phone]-- Preferred Pharmacy-[facility] [place] not in [employer]

## 2020-05-01 NOTE — Progress Notes (Signed)
Name: [name]   MRN: [id number]    DOB: [birth year]   Date:05/02/2020       Progress Note  Subjective  Chief Complaint  Cough  HPI   Asthma intermittent with exacerbation: he developed nasal congestion about 3 weeks ago and also a dry cough, never had fever, chills, post-nasal drainage, facial pressure, lack of sense of taste or smell. No dizziness. He had a virtual visit with Danelle Berry and was given tessalon perles, prednisone taper, nasal spray  and singulair. He has noticed improvement but is tired of coughing. He denies any wheezing or SOB. He has not used Ventolin, explained post-bronchial cough versus asthma flare . We will try Breo and resume Tessalon perles. He states cough keeps him up at night so we will add Tussionex   Morbid obesity: he has gained 28 lbs since his last visit in person back in 2019. He states eats comfort at home, lots of fried food, he does not eat often, but likes chips. Discussed using an air fryer, get healthy snacks for the house  Elevated blood pressure: we will recheck next week, never took medication, discussed risk factors and long term risk of uncontrolled HTN. 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 COVID-19 virus infection 02/07/2019  . Asthma, mild intermittent   . Morbid obesity (HCC) 02/08/2008    No past surgical history on file.  Family History  Problem Relation Age of Onset  . Obesity Mother   . Kidney disease Father        mass  . Liver cancer Father   . Obesity Sister     Social History   Tobacco Use  . Smoking status: Never Smoker  . Smokeless tobacco: Never Used  Substance Use Topics  . Alcohol  use: Yes    Alcohol/week: 1.0 standard drink    Types: 1 Cans of beer per week     Current Outpatient Medications:  .  albuterol (VENTOLIN HFA) 108 (90 Base) MCG/ACT inhaler, Inhale 2 puffs into the lungs every 4 (four) hours as needed for wheezing or shortness of breath., Disp: 18 g, Rfl: 2 .  azelastine (ASTELIN) 0.1 % nasal spray, Place 2 sprays into both nostrils 2 (two) times daily. Use in each nostril as directed, Disp: 30 mL, Rfl: 2 .  benzonatate (TESSALON) 200 MG capsule, Take 1 capsule (200 mg total) by mouth 3 (three) times daily as needed., Disp: 40 capsule, Rfl: 0 .  chlorpheniramine-HYDROcodone (TUSSIONEX PENNKINETIC ER) 10-8 MG/5ML SUER, Take 5 mLs by mouth at bedtime., Disp: 140 mL, Rfl: 0 .  fluticasone (FLONASE) 50 MCG/ACT nasal spray, Place 2 sprays into both nostrils daily., Disp: 16 g, Rfl: 2 .  fluticasone furoate-vilanterol (BREO ELLIPTA) 100-25 MCG/INH AEPB, Inhale 1 puff into the lungs daily., Disp: 60 each, Rfl: 0 .  ibuprofen (ADVIL) 200 MG tablet, Take 600 mg by mouth every 6 (six) hours as needed for fever or moderate pain., Disp: , Rfl:  .  montelukast (SINGULAIR) 10 MG tablet, Take 1 tablet (10 mg total) by mouth at bedtime., Disp: 90 tablet, Rfl: 0  No Known Allergies  I personally reviewed active  problem list, medication list, allergies, family history, social history, health maintenance, notes from last encounter with the patient/caregiver today.   ROS  Constitutional: Negative for fever , positive for  weight change.  Respiratory: positive  for cough but no  shortness of breath.   Cardiovascular: Negative for chest pain or palpitations.  Gastrointestinal: Negative for abdominal pain, no bowel changes.  Musculoskeletal: Negative for gait problem or joint swelling.  Skin: Negative for rash.  Neurological: Negative for dizziness or headache.  No other specific complaints in a complete review of systems (except as listed in HPI  above).  Objective  Vitals:   05/02/20 1254 05/02/20 1412  BP: (!) 142/84 118/86  Pulse: 96   Resp: 16   Temp: (!) 97.5 F (36.4 C)   TempSrc: Oral   SpO2: 100%   Weight: (!) 467 lb 14.4 oz (212.2 kg)   Height: 6\' 4"  (1.93 m)     Body mass index is 56.95 kg/m.  Physical Exam  Constitutional: Patient appears well-developed and well-nourished. Obese  No distress.  HEENT: head atraumatic, normocephalic, pupils equal and reactive to light,  neck supple Cardiovascular: Normal rate, regular rhythm and normal heart sounds.  No murmur heard. No BLE edema. Pulmonary/Chest: Effort normal and breath sounds normal. No respiratory distress. Abdominal: Soft.  There is no tenderness. Psychiatric: Patient has a normal mood and affect. behavior is normal. Judgment and thought content normal.  PHQ2/9: Depression screen [facility] 2/9 05/02/2020 04/23/2020 02/15/2019 01/28/2019 09/20/2018  Decreased Interest 0 0 0 0 0  Down, Depressed, Hopeless 0 0 0 0 0  PHQ - 2 Score 0 0 0 0 0  Altered sleeping - - 0 0 1  Tired, decreased energy - - 0 0 0  Change in appetite - - 0 0 0  Feeling bad or failure about yourself  - - 0 0 0  Trouble concentrating - - 0 0 0  Moving slowly or fidgety/restless - - 0 0 0  Suicidal thoughts - - 0 0 0  PHQ-9 Score - - 0 0 1  Difficult doing work/chores - - Not difficult at all Not difficult at all Not difficult at all    phq 9 is negative   Fall Risk: Fall Risk  05/02/2020 04/23/2020 02/15/2019 01/28/2019 09/20/2018  Falls in the past year? 0 0 0 0 0  Number falls in past yr: 0 0 0 0 0  Injury with Fall? 0 0 0 0 0     Functional Status Survey: Is the patient deaf or have difficulty hearing?: No Does the patient have difficulty seeing, even when wearing glasses/contacts?: No Does the patient have difficulty concentrating, remembering, or making decisions?: No Does the patient have difficulty walking or climbing stairs?: No Does the patient have difficulty dressing or  bathing?: No Does the patient have difficulty doing errands alone such as visiting a doctor's office or shopping?: No    Assessment & Plan  1. Mild intermittent asthma with acute exacerbation  - fluticasone furoate-vilanterol (BREO ELLIPTA) 100-25 MCG/INH AEPB; Inhale 1 puff into the lungs daily.  Dispense: 60 each; Refill: 0  2. Morbid obesity (HCC)  Discussed with the patient the risk posed by an increased BMI. Discussed importance of portion control, calorie counting and at least 150 minutes of physical activity weekly. Avoid sweet beverages and drink more water. Eat at least 6 servings of fruit and vegetables daily   3. Seasonal allergic rhinitis, unspecified trigger   4. Pre-diabetes  - COMPLETE METABOLIC  PANEL WITH GFR - Hemoglobin A1c  5. Elevated blood-pressure reading without diagnosis of hypertension  - Microalbumin / creatinine urine ratio - COMPLETE METABOLIC PANEL WITH GFR  6. Anemia, unspecified type  - CBC with Differential/Platelet  7. Routine screening for STI (sexually transmitted infection)  - RPR - HIV Antibody (routine testing w rflx) - Cytology (oral, anal, urethral) ancillary only  8. Need for hepatitis C screening test  - Hepatitis C antibody  9. Lipid screening  - Lipid panel  10. Needs flu shot  Today   11. Need for hepatitis A immunization  - Hepatitis A vaccine adult IM  12. Need for HPV vaccination  - HPV 9-valent vaccine,Recombinat  13. Rhinosinusitis  - benzonatate (TESSALON) 200 MG capsule; Take 1 capsule (200 mg total) by mouth 3 (three) times daily as needed.  Dispense: 40 capsule; Refill: 0  14. Cough  - chlorpheniramine-HYDROcodone (TUSSIONEX PENNKINETIC ER) 10-8 MG/5ML SUER; Take 5 mLs by mouth at bedtime.  Dispense: 140 mL; Refill: 0

## 2020-05-01 NOTE — Telephone Encounter (Signed)
Patient is calling to report that he is still not feeling better after taking the benzonatate (TESSALON) 100 MG capsule [[id number]] . Patient had virtual appt on 04/23/20. Patient also had negative COVID test. Patient is calling to request some advice in hopes that he will feel better soon. Please advise Cb-  [phone]-- Preferred Pharmacy-[facility], [place] not in [employer]  Pt did not hear back yesterday is wanting a CB today 12/7 if at all possible

## 2020-05-02 ENCOUNTER — Other Ambulatory Visit: Payer: Self-pay

## 2020-05-02 ENCOUNTER — Other Ambulatory Visit (HOSPITAL_COMMUNITY)
Admission: RE | Admit: 2020-05-02 | Discharge: 2020-05-02 | Disposition: A | Discharge status: Home / Self Care | Payer: 59 | Source: Ambulatory Visit | Attending: Family Medicine | Admitting: Family Medicine

## 2020-05-02 ENCOUNTER — Ambulatory Visit: Payer: 59 | Admitting: Family Medicine

## 2020-05-02 ENCOUNTER — Encounter: Payer: Self-pay | Admitting: Family Medicine

## 2020-05-02 VITALS — BP 118/86 | HR 96 | Temp 97.5°F | Resp 16 | Ht 76.0 in | Wt >= 6400 oz

## 2020-05-02 DIAGNOSIS — J302 Other seasonal allergic rhinitis: Secondary | ICD-10-CM

## 2020-05-02 DIAGNOSIS — R059 Cough, unspecified: Secondary | ICD-10-CM

## 2020-05-02 DIAGNOSIS — Z113 Encounter for screening for infections with a predominantly sexual mode of transmission: Secondary | ICD-10-CM | POA: Insufficient documentation

## 2020-05-02 DIAGNOSIS — J329 Chronic sinusitis, unspecified: Secondary | ICD-10-CM

## 2020-05-02 DIAGNOSIS — Z1159 Encounter for screening for other viral diseases: Secondary | ICD-10-CM

## 2020-05-02 DIAGNOSIS — R030 Elevated blood-pressure reading, without diagnosis of hypertension: Secondary | ICD-10-CM

## 2020-05-02 DIAGNOSIS — J310 Chronic rhinitis: Secondary | ICD-10-CM

## 2020-05-02 DIAGNOSIS — Z13220 Encounter for screening for lipoid disorders: Secondary | ICD-10-CM

## 2020-05-02 DIAGNOSIS — R7303 Prediabetes: Secondary | ICD-10-CM | POA: Diagnosis not present

## 2020-05-02 DIAGNOSIS — Z23 Encounter for immunization: Secondary | ICD-10-CM

## 2020-05-02 DIAGNOSIS — J4521 Mild intermittent asthma with (acute) exacerbation: Secondary | ICD-10-CM

## 2020-05-02 DIAGNOSIS — D649 Anemia, unspecified: Secondary | ICD-10-CM

## 2020-05-02 MED ORDER — HYDROCOD POLST-CPM POLST ER 10-8 MG/5ML PO SUER: 5.0000 mL | Freq: Every evening | ORAL | 0 refills | Status: DC

## 2020-05-02 MED ORDER — BENZONATATE 200 MG PO CAPS: 200.0000 mg | ORAL_CAPSULE | Freq: Three times a day (TID) | ORAL | 0 refills | Status: DC | PRN

## 2020-05-02 MED ORDER — BREO ELLIPTA 100-25 MCG/INH IN AEPB: 1.0000 | INHALATION_SPRAY | Freq: Every day | RESPIRATORY_TRACT | 0 refills | Status: DC

## 2020-05-05 LAB — CBC WITH DIFFERENTIAL/PLATELET
Absolute Monocytes: 694 cells/uL (ref 200–950)
Basophils Absolute: 27 cells/uL (ref 0–200)
Basophils Relative: 0.3 %
Eosinophils Absolute: 142 cells/uL (ref 15–500)
Eosinophils Relative: 1.6 %
HCT: 39.3 % (ref 38.5–50.0)
Hemoglobin: 12.6 g/dL — ABNORMAL LOW (ref 13.2–17.1)
Lymphs Abs: 2652 cells/uL (ref 850–3900)
MCH: 23.3 pg — ABNORMAL LOW (ref 27.0–33.0)
MCHC: 32.1 g/dL (ref 32.0–36.0)
MCV: 72.8 fL — ABNORMAL LOW (ref 80.0–100.0)
MPV: 11.2 fL (ref 7.5–12.5)
Monocytes Relative: 7.8 %
Neutro Abs: 5385 cells/uL (ref 1500–7800)
Neutrophils Relative %: 60.5 %
Platelets: 310 10*3/uL (ref 140–400)
RBC: 5.4 10*6/uL (ref 4.20–5.80)
RDW: 15.4 % — ABNORMAL HIGH (ref 11.0–15.0)
Total Lymphocyte: 29.8 %
WBC: 8.9 10*3/uL (ref 3.8–10.8)

## 2020-05-05 LAB — MICROALBUMIN / CREATININE URINE RATIO
Creatinine, Urine: 170 mg/dL (ref 20–320)
Microalb Creat Ratio: 4 mcg/mg creat (ref <30)
Microalb, Ur: 0.7 mg/dL

## 2020-05-05 LAB — LIPID PANEL
Cholesterol: 155 mg/dL (ref <200)
HDL: 40 mg/dL (ref >=40)
LDL Cholesterol (Calc): 94 mg/dL (calc)
Non-HDL Cholesterol (Calc): 115 mg/dL (calc) (ref <130)
Total CHOL/HDL Ratio: 3.9 (calc) (ref <5.0)
Triglycerides: 116 mg/dL (ref <150)

## 2020-05-05 LAB — COMPLETE METABOLIC PANEL WITH GFR
AG Ratio: 1.2 (calc) (ref 1.0–2.5)
ALT: 24 U/L (ref 9–46)
AST: 14 U/L (ref 10–40)
Albumin: 4.1 g/dL (ref 3.6–5.1)
Alkaline phosphatase (APISO): 74 U/L (ref 36–130)
BUN: 20 mg/dL (ref 7–25)
CO2: 32 mmol/L (ref 20–32)
Calcium: 9.4 mg/dL (ref 8.6–10.3)
Chloride: 103 mmol/L (ref 98–110)
Creat: 0.86 mg/dL (ref 0.60–1.35)
GFR, Est African American: 138 mL/min/{1.73_m2} (ref >=60)
GFR, Est Non African American: 119 mL/min/{1.73_m2} (ref >=60)
Globulin: 3.3 g/dL (calc) (ref 1.9–3.7)
Glucose, Bld: 88 mg/dL (ref 65–99)
Potassium: 4.5 mmol/L (ref 3.5–5.3)
Sodium: 140 mmol/L (ref 135–146)
Total Bilirubin: 0.7 mg/dL (ref 0.2–1.2)
Total Protein: 7.4 g/dL (ref 6.1–8.1)

## 2020-05-05 LAB — TEST AUTHORIZATION

## 2020-05-05 LAB — HEPATITIS C ANTIBODY
Hepatitis C Ab: NONREACTIVE
SIGNAL TO CUT-OFF: 0.02 (ref <1.00)

## 2020-05-05 LAB — IRON,TIBC AND FERRITIN PANEL
%SAT: 16 % (calc) — ABNORMAL LOW (ref 20–48)
Ferritin: 85 ng/mL (ref 38–380)
Iron: 44 ug/dL — ABNORMAL LOW (ref 50–195)
TIBC: 283 mcg/dL (calc) (ref 250–425)

## 2020-05-05 LAB — HEMOGLOBIN A1C
Hgb A1c MFr Bld: 5.8 % of total Hgb — ABNORMAL HIGH (ref <5.7)
Mean Plasma Glucose: 120 mg/dL
eAG (mmol/L): 6.6 mmol/L

## 2020-05-05 LAB — HIV ANTIBODY (ROUTINE TESTING W REFLEX): HIV 1&2 Ab, 4th Generation: NONREACTIVE

## 2020-05-05 LAB — RPR: RPR Ser Ql: NONREACTIVE

## 2020-05-07 LAB — CYTOLOGY, (ORAL, ANAL, URETHRAL) ANCILLARY ONLY
Chlamydia: NEGATIVE
Comment: NEGATIVE
Comment: NORMAL
Neisseria Gonorrhea: NEGATIVE

## 2020-05-09 ENCOUNTER — Other Ambulatory Visit: Payer: Self-pay | Admitting: Family Medicine

## 2020-05-09 ENCOUNTER — Other Ambulatory Visit: Payer: Self-pay

## 2020-05-09 ENCOUNTER — Ambulatory Visit: Payer: 59

## 2020-05-09 ENCOUNTER — Telehealth: Payer: Self-pay

## 2020-05-09 VITALS — BP 146/82

## 2020-05-09 DIAGNOSIS — Z0130 Encounter for examination of blood pressure without abnormal findings: Secondary | ICD-10-CM

## 2020-05-09 MED ORDER — HYDROCHLOROTHIAZIDE 12.5 MG PO TABS: 12.5000 mg | ORAL_TABLET | Freq: Every day | ORAL | 0 refills | Status: DC

## 2020-05-09 NOTE — Telephone Encounter (Signed)
Pt stated he is willing to starts HCTZ 12.5

## 2020-05-30 ENCOUNTER — Other Ambulatory Visit: Payer: Self-pay | Admitting: Family Medicine

## 2020-05-30 DIAGNOSIS — J4521 Mild intermittent asthma with (acute) exacerbation: Secondary | ICD-10-CM

## 2020-10-04 ENCOUNTER — Encounter: Payer: Self-pay | Admitting: Family Medicine

## 2020-10-05 ENCOUNTER — Telehealth: Payer: Self-pay

## 2020-10-05 NOTE — Telephone Encounter (Signed)
Pt has an appt for next week °

## 2020-10-09 NOTE — Progress Notes (Signed)
Name: [name]   MRN: [id number]    DOB: 31-Jan-1991   Date:10/10/2020       Progress Note  Subjective  Chief Complaint  Follow Up  HPI  Asthma intermittent with exacerbation: he woke up in the early morning 12th gasping for air, felt like he could not breath and had wheezing. He used two puffs of rescue inhaler and felt better. He woke up with a productive cough the following morning that was yellow in color. He had some tessalon perles and tussionex and has been taking it , but continues to have a cough, not as bad now. He does not have any breo left at home. Denies symptoms of URI. He is up to date with COVID-19 visit We will resume Breo  Rash: he has a dry spot on lower leg, hyperpigmented, going on for about 6 weeks, no pruritis, he has not tried any medications yet   Morbid obesity: he has gained over 30 lbs  since his last visit in person back in 2019. He states eats comfort at home, lots of fried food, he is not snacking as often. He is not very physically active. His weight at age 59 was 312 lbs. Discussed importance of weight loss . He is wiling to try Metformin   HTN: bp is better today, we started him on HCTZ back in Dec but he ran out of medication, bp not very high today, but willing to take it. Denies chest pain or palpitation   Pre-diabetes: last A1C was Dec 2021 at 5.8 %. He denies polyphagia, polydipsia or polyuria , discussed Metformin and he is willing to try it. Discussed possible side effects    Patient Active Problem List   Diagnosis Date Noted  . COVID-19 virus infection 02/07/2019  . Asthma, mild intermittent   . Morbid obesity (HCC) 02/08/2008    No past surgical history on file.  Family History  Problem Relation Age of Onset  . Obesity Mother   . Kidney disease Father        mass  . Liver cancer Father   . Obesity Sister     Social History   Tobacco Use  . Smoking status: Never Smoker  . Smokeless tobacco: Never Used  Substance Use Topics  .  Alcohol use: Yes    Alcohol/week: 1.0 standard drink    Types: 1 Cans of beer per week     Current Outpatient Medications:  .  albuterol (VENTOLIN HFA) 108 (90 Base) MCG/ACT inhaler, Inhale 2 puffs into the lungs every 4 (four) hours as needed for wheezing or shortness of breath., Disp: 18 g, Rfl: 2 .  azelastine (ASTELIN) 0.1 % nasal spray, Place 2 sprays into both nostrils 2 (two) times daily. Use in each nostril as directed, Disp: 30 mL, Rfl: 2 .  fluticasone (FLONASE) 50 MCG/ACT nasal spray, Place 2 sprays into both nostrils daily., Disp: 16 g, Rfl: 2 .  hydrochlorothiazide (HYDRODIURIL) 12.5 MG tablet, Take 1 tablet (12.5 mg total) by mouth daily., Disp: 90 tablet, Rfl: 0 .  ibuprofen (ADVIL) 200 MG tablet, Take 600 mg by mouth every 6 (six) hours as needed for fever or moderate pain., Disp: , Rfl:  .  BREO ELLIPTA 100-25 MCG/INH AEPB, TAKE 1 PUFF BY MOUTH EVERY DAY (Patient not taking: Reported on 10/10/2020), Disp: 60 each, Rfl: 0  No Known Allergies  I personally reviewed active problem list, medication list, allergies, family history, social history, health maintenance with the patient/caregiver today.  ROS  Constitutional: Negative for fever or weight change.  Respiratory: positive for cough and shortness of breath.   Cardiovascular: Negative for chest pain or palpitations.  Gastrointestinal: Negative for abdominal pain, no bowel changes.  Musculoskeletal: Negative for gait problem or joint swelling.  Skin: positive for rash.  Neurological: Negative for dizziness or headache.  No other specific complaints in a complete review of systems (except as listed in HPI above).  Objective  Vitals:   10/10/20 1146  BP: 136/82  Pulse: 88  Resp: 16  Temp: 98.2 F (36.8 C)  TempSrc: Oral  SpO2: 98%  Weight: (!) 469 lb (212.7 kg)  Height: 6\' 4"  (1.93 m)    Body mass index is 57.09 kg/m.  Physical Exam  Constitutional: Patient appears well-developed and well-nourished.  Obese  No distress.  HEENT: head atraumatic, normocephalic, pupils equal and reactive to light, neck supple Cardiovascular: Normal rate, regular rhythm and normal heart sounds.  No murmur heard. No BLE edema. Pulmonary/Chest: Effort normal and breath sounds normal. No respiratory distress. Abdominal: Soft.  There is no tenderness. Psychiatric: Patient has a normal mood and affect. behavior is normal. Judgment and thought content normal.  PHQ2/9: Depression screen [facility] 2/9 10/10/2020 05/02/2020 04/23/2020 02/15/2019 01/28/2019  Decreased Interest 0 0 0 0 0  Down, Depressed, Hopeless 0 0 0 0 0  PHQ - 2 Score 0 0 0 0 0  Altered sleeping - - - 0 0  Tired, decreased energy - - - 0 0  Change in appetite - - - 0 0  Feeling bad or failure about yourself  - - - 0 0  Trouble concentrating - - - 0 0  Moving slowly or fidgety/restless - - - 0 0  Suicidal thoughts - - - 0 0  PHQ-9 Score - - - 0 0  Difficult doing work/chores - - - Not difficult at all Not difficult at all    phq 9 is negative   Fall Risk: Fall Risk  10/10/2020 05/02/2020 04/23/2020 02/15/2019 01/28/2019  Falls in the past year? 0 0 0 0 0  Number falls in past yr: 0 0 0 0 0  Injury with Fall? 0 0 0 0 0     Functional Status Survey: Is the patient deaf or have difficulty hearing?: No Does the patient have difficulty seeing, even when wearing glasses/contacts?: No Does the patient have difficulty concentrating, remembering, or making decisions?: No Does the patient have difficulty walking or climbing stairs?: No Does the patient have difficulty dressing or bathing?: No Does the patient have difficulty doing errands alone such as visiting a doctor's office or shopping?: No    Assessment & Plan  1. Mild intermittent asthma with acute exacerbation  - fluticasone furoate-vilanterol (BREO ELLIPTA) 100-25 MCG/INH AEPB; Inhale 1 puff into the lungs daily.  Dispense: 60 each; Refill: 2  2. Rash  - triamcinolone cream (KENALOG) 0.1 %;  Apply 1 application topically 2 (two) times daily.  Dispense: 80 g; Refill: 0  3. Morbid obesity (HCC)  - metFORMIN (GLUCOPHAGE XR) 750 MG 24 hr tablet; Take 1 tablet (750 mg total) by mouth daily with breakfast.  Dispense: 90 tablet; Refill: 1  4. Pre-diabetes  - metFORMIN (GLUCOPHAGE XR) 750 MG 24 hr tablet; Take 1 tablet (750 mg total) by mouth daily with breakfast.  Dispense: 90 tablet; Refill: 1  5. Primary hypertension  - hydrochlorothiazide (HYDRODIURIL) 12.5 MG tablet; Take 1 tablet (12.5 mg total) by mouth daily.  Dispense: 90 tablet; Refill:  1 

## 2020-10-10 ENCOUNTER — Encounter: Payer: Self-pay | Admitting: Family Medicine

## 2020-10-10 ENCOUNTER — Other Ambulatory Visit: Payer: Self-pay

## 2020-10-10 ENCOUNTER — Ambulatory Visit: Payer: 59 | Admitting: Family Medicine

## 2020-10-10 DIAGNOSIS — R21 Rash and other nonspecific skin eruption: Secondary | ICD-10-CM | POA: Diagnosis not present

## 2020-10-10 DIAGNOSIS — J4521 Mild intermittent asthma with (acute) exacerbation: Secondary | ICD-10-CM | POA: Diagnosis not present

## 2020-10-10 DIAGNOSIS — I10 Essential (primary) hypertension: Secondary | ICD-10-CM

## 2020-10-10 DIAGNOSIS — R7303 Prediabetes: Secondary | ICD-10-CM | POA: Diagnosis not present

## 2020-10-10 MED ORDER — TRIAMCINOLONE ACETONIDE 0.1 % EX CREA: 1.0000 "application " | TOPICAL_CREAM | Freq: Two times a day (BID) | CUTANEOUS | 0 refills | Status: DC

## 2020-10-10 MED ORDER — METFORMIN HCL ER 750 MG PO TB24: 750.0000 mg | ORAL_TABLET | Freq: Every day | ORAL | 1 refills | Status: DC

## 2020-10-10 MED ORDER — HYDROCHLOROTHIAZIDE 12.5 MG PO TABS: 12.5000 mg | ORAL_TABLET | Freq: Every day | ORAL | 1 refills | Status: DC

## 2020-10-10 MED ORDER — BREO ELLIPTA 100-25 MCG/INH IN AEPB: 1.0000 | INHALATION_SPRAY | Freq: Every day | RESPIRATORY_TRACT | 2 refills | Status: DC

## 2020-11-08 ENCOUNTER — Encounter: Payer: 59 | Admitting: Family Medicine

## 2020-12-04 ENCOUNTER — Encounter: Payer: Self-pay | Admitting: Family Medicine

## 2020-12-06 ENCOUNTER — Other Ambulatory Visit: Payer: Self-pay | Admitting: Family Medicine

## 2020-12-06 DIAGNOSIS — Z87898 Personal history of other specified conditions: Secondary | ICD-10-CM

## 2020-12-06 MED ORDER — SCOPOLAMINE 1 MG/3DAYS TD PT72: 1.0000 | MEDICATED_PATCH | TRANSDERMAL | 0 refills | Status: DC

## 2020-12-16 ENCOUNTER — Encounter: Payer: Self-pay | Admitting: Family Medicine

## 2020-12-18 ENCOUNTER — Telehealth (INDEPENDENT_AMBULATORY_CARE_PROVIDER_SITE_OTHER): Payer: BLUE CROSS/BLUE SHIELD | Admitting: Unknown Physician Specialty

## 2020-12-18 ENCOUNTER — Encounter: Payer: Self-pay | Admitting: Unknown Physician Specialty

## 2020-12-18 VITALS — Ht 76.0 in | Wt >= 6400 oz

## 2020-12-18 DIAGNOSIS — U071 COVID-19: Secondary | ICD-10-CM | POA: Diagnosis not present

## 2020-12-18 NOTE — Progress Notes (Signed)
Ht 6\' 4"  (1.93 m)   Wt (!) 469 lb (212.7 kg)   BMI 57.09 kg/m    Subjective:    Patient ID: [name], male    DOB: 1990-08-30, 30 y.o.   MRN: [id number]  HPI: [name] is a 30 y.o. male  Chief Complaint  Patient presents with   Covid Positive    At home test on Saturday, symptoms include: congestion and cough   URI  This is a new problem. Episode onset: Day 8 of symptoms. Progression since onset: better but persistent coughing and congestion. There has been no fever. Associated symptoms include congestion, coughing and rhinorrhea. Pertinent negatives include no abdominal pain, chest pain, diarrhea, dysuria, ear pain, headaches, joint pain, nausea, rash, sinus pain, sore throat, vomiting or wheezing. Treatments tried: mucinex. The treatment provided mild relief.   Relevant past medical, surgical, family and social history reviewed and updated as indicated. Interim medical history since our last visit reviewed. Allergies and medications reviewed and updated.  Review of Systems  HENT:  Positive for congestion and rhinorrhea. Negative for ear pain, sinus pain and sore throat.   Respiratory:  Positive for cough. Negative for wheezing.   Cardiovascular:  Negative for chest pain.  Gastrointestinal:  Negative for abdominal pain, diarrhea, nausea and vomiting.  Genitourinary:  Negative for dysuria.  Musculoskeletal:  Negative for joint pain.  Skin:  Negative for rash.  Neurological:  Negative for headaches.   Per HPI unless specifically indicated above     Objective:    Ht 6\' 4"  (1.93 m)   Wt (!) 469 lb (212.7 kg)   BMI 57.09 kg/m   Wt Readings from Last 3 Encounters:  12/18/20 (!) 469 lb (212.7 kg)  10/10/20 (!) 469 lb (212.7 kg)  05/02/20 (!) 467 lb 14.4 oz (212.2 kg)    Physical Exam Constitutional:      General: He is not in acute distress.    Appearance: Normal appearance. He is well-developed.  HENT:     Head: Normocephalic and atraumatic.  Eyes:      General: Lids are normal. No scleral icterus.       Right eye: No discharge.        Left eye: No discharge.     Conjunctiva/sclera: Conjunctivae normal.  Cardiovascular:     Rate and Rhythm: Normal rate.  Pulmonary:     Effort: Pulmonary effort is normal.  Abdominal:     Palpations: There is no hepatomegaly or splenomegaly.  Musculoskeletal:        General: Normal range of motion.  Skin:    Coloration: Skin is not pale.     Findings: No rash.  Neurological:     Mental Status: He is alert and oriented to person, place, and time.  Psychiatric:        Behavior: Behavior normal.        Thought Content: Thought content normal.        Judgment: Judgment normal.    Results for orders placed or performed in visit on 05/02/20  Lipid panel  Result Value Ref Range   Cholesterol 155 <200 mg/dL   HDL 40 > OR = 40 mg/dL   Triglycerides 14/08/21 14/08/21 mg/dL   LDL Cholesterol (Calc) 94 mg/dL (calc)   Total CHOL/HDL Ratio 3.9 <5.0 (calc)   Non-HDL Cholesterol (Calc) 115 <130 mg/dL (calc)  Microalbumin / creatinine urine ratio  Result Value Ref Range   Creatinine, Urine 170 20 - 320 mg/dL   Microalb, Ur  0.7 mg/dL   Microalb Creat Ratio 4 <30 mcg/mg creat  COMPLETE METABOLIC PANEL WITH GFR  Result Value Ref Range   Glucose, Bld 88 65 - 99 mg/dL   BUN 20 7 - 25 mg/dL   Creat 4.70 9.62 - 8.36 mg/dL   GFR, Est Non African American 119 > OR = 60 mL/min/1.51m2   GFR, Est African American 138 > OR = 60 mL/min/1.51m2   BUN/Creatinine Ratio NOT APPLICABLE 6 - 22 (calc)   Sodium 140 135 - 146 mmol/L   Potassium 4.5 3.5 - 5.3 mmol/L   Chloride 103 98 - 110 mmol/L   CO2 32 20 - 32 mmol/L   Calcium 9.4 8.6 - 10.3 mg/dL   Total Protein 7.4 6.1 - 8.1 g/dL   Albumin 4.1 3.6 - 5.1 g/dL   Globulin 3.3 1.9 - 3.7 g/dL (calc)   AG Ratio 1.2 1.0 - 2.5 (calc)   Total Bilirubin 0.7 0.2 - 1.2 mg/dL   Alkaline phosphatase (APISO) 74 36 - 130 U/L   AST 14 10 - 40 U/L   ALT 24 9 - 46 U/L  CBC with  Differential/Platelet  Result Value Ref Range   WBC 8.9 3.8 - 10.8 Thousand/uL   RBC 5.40 4.20 - 5.80 Million/uL   Hemoglobin 12.6 (L) 13.2 - 17.1 g/dL   HCT 62.9 47.6 - 54.6 %   MCV 72.8 (L) 80.0 - 100.0 fL   MCH 23.3 (L) 27.0 - 33.0 pg   MCHC 32.1 32.0 - 36.0 g/dL   RDW 50.3 (H) 54.6 - 56.8 %   Platelets 310 140 - 400 Thousand/uL   MPV 11.2 7.5 - 12.5 fL   Neutro Abs 5,385 1,500 - 7,800 cells/uL   Lymphs Abs 2,652 850 - 3,900 cells/uL   Absolute Monocytes 694 200 - 950 cells/uL   Eosinophils Absolute 142 15 - 500 cells/uL   Basophils Absolute 27 0 - 200 cells/uL   Neutrophils Relative % 60.5 %   Total Lymphocyte 29.8 %   Monocytes Relative 7.8 %   Eosinophils Relative 1.6 %   Basophils Relative 0.3 %  Hemoglobin A1c  Result Value Ref Range   Hgb A1c MFr Bld 5.8 (H) <5.7 % of total Hgb   Mean Plasma Glucose 120 mg/dL   eAG (mmol/L) 6.6 mmol/L  Hepatitis C antibody  Result Value Ref Range   Hepatitis C Ab NON-REACTIVE NON-REACTI   SIGNAL TO CUT-OFF 0.02 <1.00  RPR  Result Value Ref Range   RPR Ser Ql NON-REACTIVE NON-REACTI  HIV Antibody (routine testing w rflx)  Result Value Ref Range   HIV 1&2 Ab, 4th Generation NON-REACTIVE NON-REACTI  Iron, TIBC and Ferritin Panel  Result Value Ref Range   Iron 44 (L) 50 - 195 mcg/dL   TIBC 127 517 - 001 mcg/dL (calc)   %SAT 16 (L) 20 - 48 % (calc)   Ferritin 85 38 - 380 ng/mL  TEST AUTHORIZATION  Result Value Ref Range   TEST NAME: IRON, TIBC AND FERRITIN PANEL    TEST CODE: [id number]    CLIENT CONTACT: DR Carlynn Purl    REPORT ALWAYS MESSAGE SIGNATURE    Cytology (oral, anal, urethral) ancillary only  Result Value Ref Range   Neisseria Gonorrhea Negative    Chlamydia Negative    Comment Normal Reference Ranger Chlamydia - Negative    Comment      Normal Reference Range Neisseria Gonorrhea - Negative      Assessment & Plan:   Problem List  Items Addressed This Visit   None Visit Diagnoses     COVID-19    -  Primary   Day  8 and outside oral tx window.  Continue with mucinex and add Vit C,D, and Zinc.  Isolation precaution reviewed.        RTC if symptoms not continually improving.    Follow up plan: Return if symptoms worsen or fail to improve.

## 2021-02-06 IMAGING — DX DG CHEST 1V PORT
1 series · 1 of 1 positions shown · non-contrast
Comparison: 12/08/2014

CLINICAL DATA: Cough 1 week which shortness-of-breath and decreased
urine output.

EXAM:
PORTABLE CHEST 1 VIEW

[chest ap]
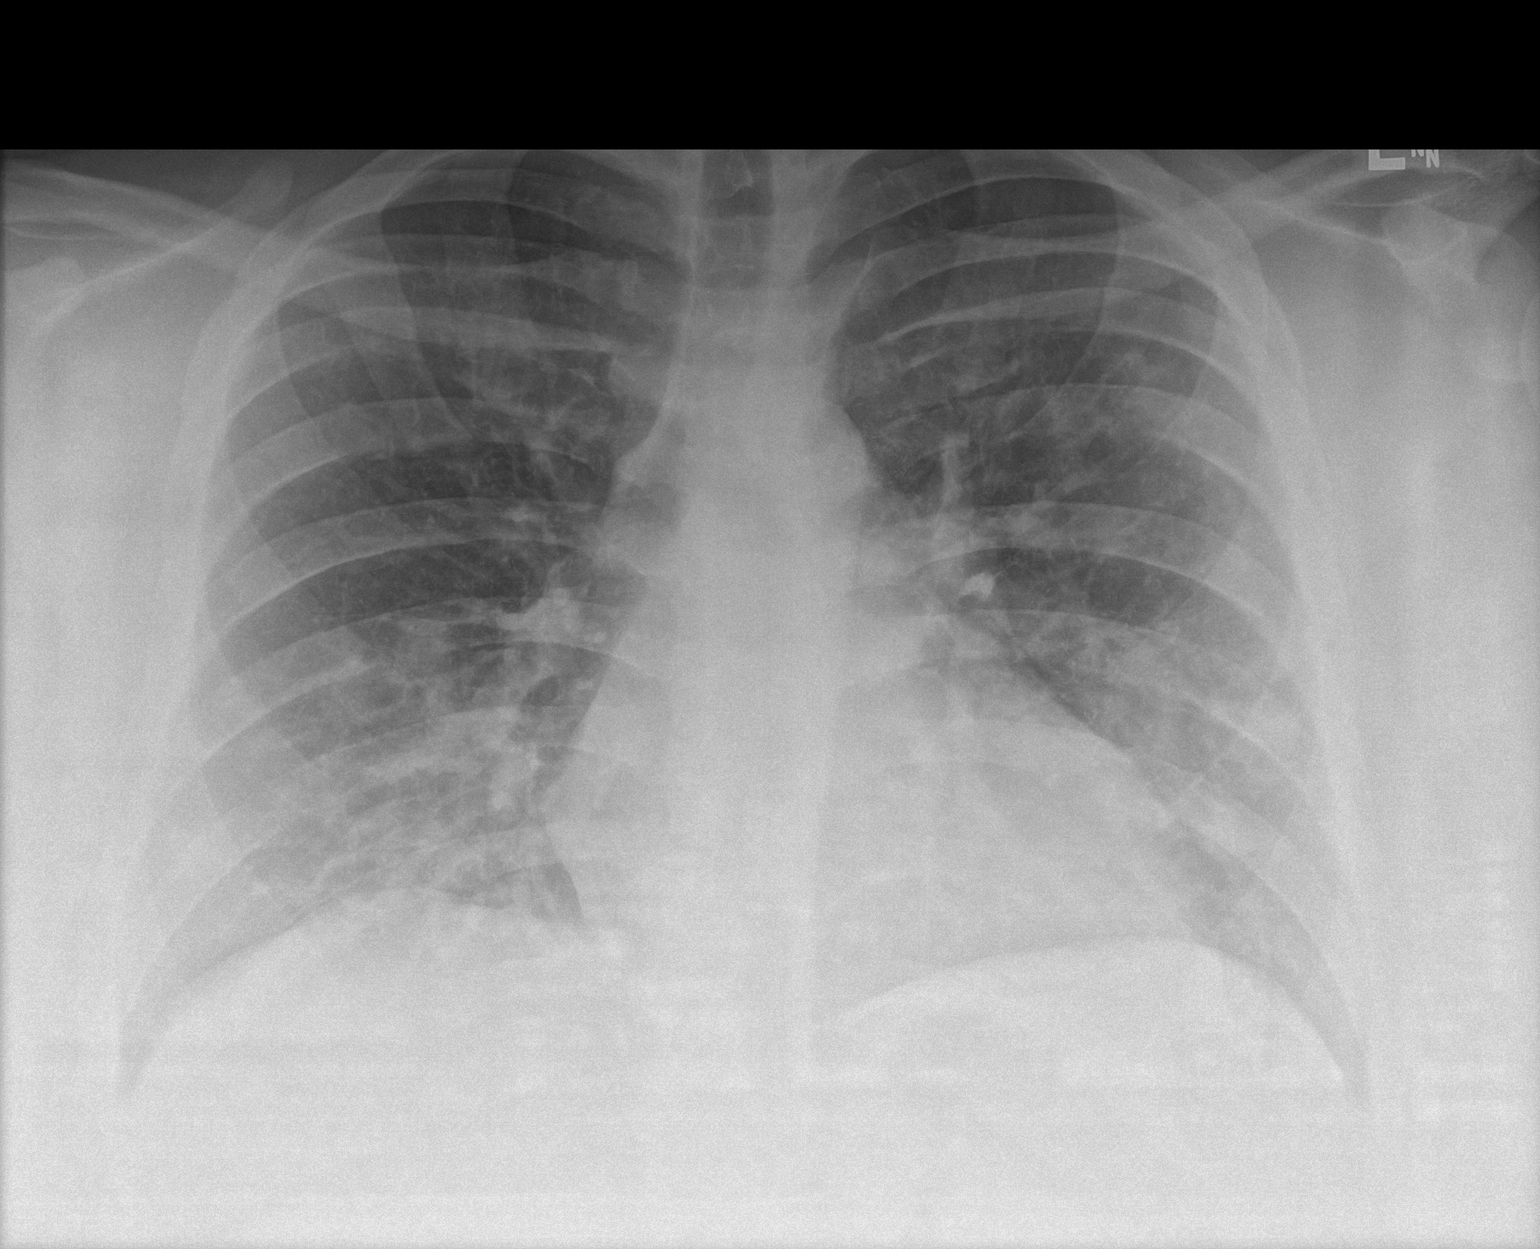

[1 of 1 positions shown; findings below may reference images not displayed]

FINDINGS: Lungs are adequately inflated with mild patchy bilateral
opacification. No effusion. Cardiomediastinal silhouette and
remainder of the exam is unchanged.
IMPRESSION: Mild patchy bilateral airspace opacification concerning for
infection.

## 2021-02-08 IMAGING — DX DG CHEST 1V PORT
1 series · 1 of 1 positions shown · non-contrast
Comparison: February 05, 2019.

CLINICAL DATA: Shortness of breath.  Positive [id number]

EXAM:
PORTABLE CHEST 1 VIEW

[chest ap]
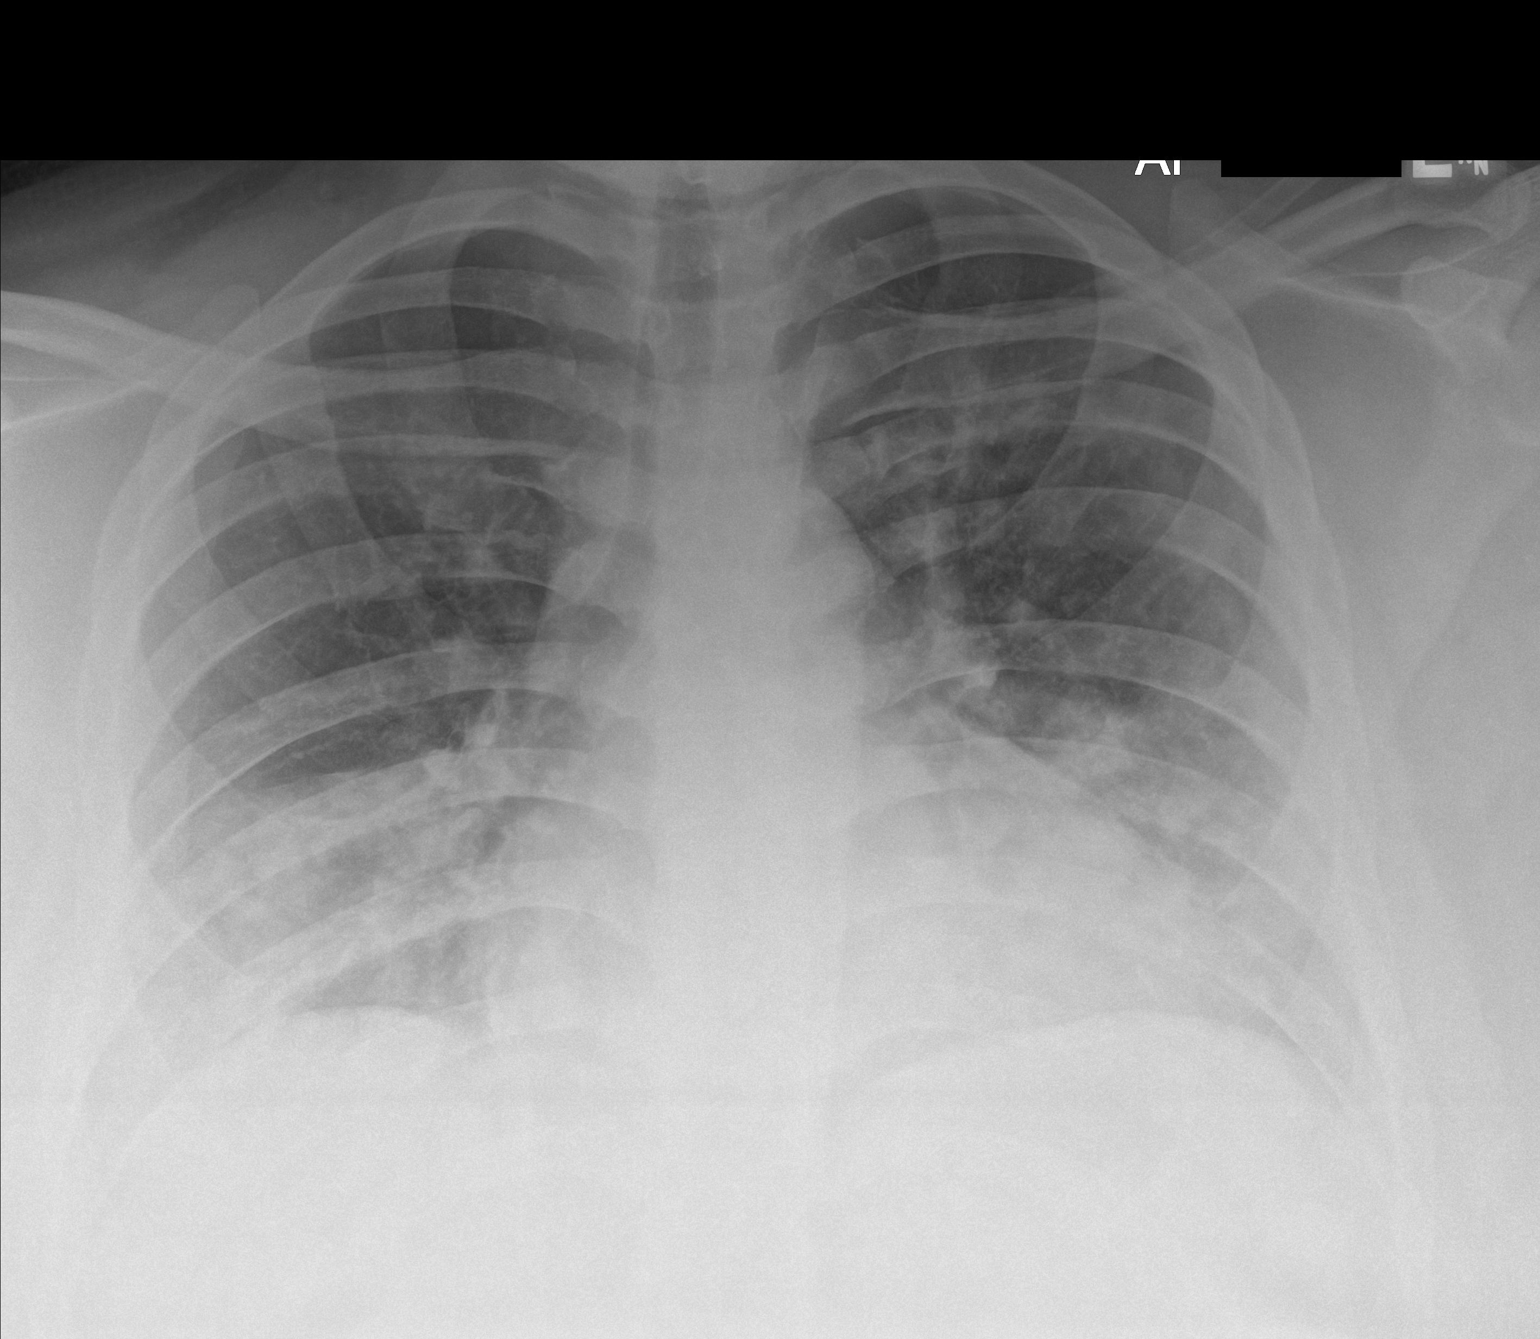

[1 of 1 positions shown; findings below may reference images not displayed]

FINDINGS: There is airspace opacity throughout both lower lung regions as well
as the left upper lobe, slightly progressed from 2 days prior. Heart
is borderline enlarged with pulmonary vascularity normal. No
adenopathy. No bone lesions.
IMPRESSION: Multifocal pneumonia, marginally progressed from 2 days prior.
Stable cardiac prominence. No evident adenopathy.

## 2021-04-06 ENCOUNTER — Other Ambulatory Visit: Payer: Self-pay | Admitting: Family Medicine

## 2021-04-06 DIAGNOSIS — I10 Essential (primary) hypertension: Secondary | ICD-10-CM

## 2021-04-06 NOTE — Telephone Encounter (Signed)
Requested Prescriptions  Pending Prescriptions Disp Refills  . hydrochlorothiazide (HYDRODIURIL) 12.5 MG tablet [Pharmacy Med Name: HYDROCHLOROTHIAZIDE 12.5 MG TB] 90 tablet 0    Sig: TAKE 1 TABLET BY MOUTH EVERY DAY     Cardiovascular: Diuretics - Thiazide Passed - 04/06/2021 12:23 PM      Passed - Ca in normal range and within 360 days    Calcium  Date Value Ref Range Status  05/02/2020 9.4 8.6 - 10.3 mg/dL Final         Passed - Cr in normal range and within 360 days    Creat  Date Value Ref Range Status  05/02/2020 0.86 0.60 - 1.35 mg/dL Final   Creatinine, Urine  Date Value Ref Range Status  05/02/2020 170 20 - 320 mg/dL Final         Passed - K in normal range and within 360 days    Potassium  Date Value Ref Range Status  05/02/2020 4.5 3.5 - 5.3 mmol/L Final         Passed - Na in normal range and within 360 days    Sodium  Date Value Ref Range Status  05/02/2020 140 135 - 146 mmol/L Final         Passed - Last BP in normal range    BP Readings from Last 1 Encounters:  10/10/20 136/82         Passed - Valid encounter within last 6 months    Recent Outpatient Visits          3 months ago COVID-19   [facility] Gabriel Cirri, NP   5 months ago Morbid obesity [facility])   [facility] Alba Cory, MD   11 months ago Mild intermittent asthma with acute exacerbation   [facility] Alba Cory, MD   11 months ago Rhinosinusitis   [facility] Danelle Berry, PA-C   2 years ago Pneumonia due to COVID-19 virus   [facility] Alba Cory, MD      Future Appointments            In 6 days Alba Cory, MD [facility]

## 2021-04-11 NOTE — Progress Notes (Signed)
Name: [name]   MRN: [id number]    DOB: April 29, 1991   Date:04/12/2021       Progress Note  Subjective  Chief Complaint  Follow Up  HPI  Asthma intermittent : doing well at time, no cough or wheezing or SOB, he finished Breo during acute flare and is now only using albuterol prn but no in months   Morbid obesity: he has gained over 30 lbs  since his last visit in person back in 2019. He states eats comfort at home, lots of fried food, he is not snacking as often. He is not very physically active. His weight at age 57 was 312 lbs. Discussed importance of weight loss . He is now taking Metformin but weight is up again and willing to take Rybelsus.   HTN: bp is better today, we started him on HCTZ Dec 2021 but bp is still slightly elevated for him and we will add an ARB.Denies chest pain or palpitation   Pre-diabetes: last A1C was Dec 2021 at 5.8 %. He denies polyphagia, polydipsia or polyuria , he is taking Metformin and we will recheck labs, his weight is trending up and he is willing to try GLP-1 agonists, discussed possible side effects. He denies personal history of pancreatis. No family history of thyroid cancer.   Rectal pain: he states he had a bowel movement earlier this week , he did not strain but developed pain right away, he states since than he has a throbbing sensation intermittently throughout the day. No blood in stools, had a bowel movement this am.   Patient Active Problem List   Diagnosis Date Noted   COVID-19 virus infection 02/07/2019   Asthma, mild intermittent    Morbid obesity (HCC) 02/08/2008    No past surgical history on file.  Family History  Problem Relation Age of Onset   Obesity Mother    Kidney disease Father        mass   Liver cancer Father    Obesity Sister     Social History   Tobacco Use   Smoking status: Never   Smokeless tobacco: Never  Substance Use Topics   Alcohol use: Yes    Alcohol/week: 1.0 standard drink    Types: 1 Cans of  beer per week     Current Outpatient Medications:    albuterol (VENTOLIN HFA) 108 (90 Base) MCG/ACT inhaler, Inhale 2 puffs into the lungs every 4 (four) hours as needed for wheezing or shortness of breath., Disp: 18 g, Rfl: 2   azelastine (ASTELIN) 0.1 % nasal spray, Place 2 sprays into both nostrils 2 (two) times daily. Use in each nostril as directed, Disp: 30 mL, Rfl: 2   fluticasone (FLONASE) 50 MCG/ACT nasal spray, Place 2 sprays into both nostrils daily., Disp: 16 g, Rfl: 2   fluticasone furoate-vilanterol (BREO ELLIPTA) 100-25 MCG/INH AEPB, Inhale 1 puff into the lungs daily., Disp: 60 each, Rfl: 2   hydrochlorothiazide (HYDRODIURIL) 12.5 MG tablet, TAKE 1 TABLET BY MOUTH EVERY DAY, Disp: 90 tablet, Rfl: 0   ibuprofen (ADVIL) 200 MG tablet, Take 600 mg by mouth every 6 (six) hours as needed for fever or moderate pain., Disp: , Rfl:    metFORMIN (GLUCOPHAGE XR) 750 MG 24 hr tablet, Take 1 tablet (750 mg total) by mouth daily with breakfast., Disp: 90 tablet, Rfl: 1   scopolamine (TRANSDERM-SCOP) 1 MG/3DAYS, Place 1 patch (1.5 mg total) onto the skin every 3 (three) days., Disp: 2 patch, Rfl: 0  triamcinolone cream (KENALOG) 0.1 %, Apply 1 application topically 2 (two) times daily., Disp: 80 g, Rfl: 0  No Known Allergies  I personally reviewed active problem list, medication list, allergies, family history, social history, health maintenance with the patient/caregiver today.   ROS  Constitutional: Negative for fever , positive for  weight change.  Respiratory: Negative for cough and shortness of breath.   Cardiovascular: Negative for chest pain or palpitations.  Gastrointestinal: Negative for abdominal pain, no bowel changes.  Musculoskeletal: Negative for gait problem or joint swelling.  Skin: Negative for rash.  Neurological: Negative for dizziness or headache.  No other specific complaints in a complete review of systems (except as listed in HPI above).   Objective  Vitals:    04/12/21 0805  BP: 132/80  Pulse: 86  Resp: 16  Temp: 97.9 F (36.6 C)  SpO2: 98%  Weight: (!) 477 lb (216.4 kg)  Height: 6\' 4"  (1.93 m)    Body mass index is 58.06 kg/m.  Physical Exam  Constitutional: Patient appears well-developed and well-nourished. Obese  No distress.  HEENT: head atraumatic, normocephalic, pupils equal and reactive to light, neck supple, Cardiovascular: Normal rate, regular rhythm and normal heart sounds.  No murmur heard. No BLE edema. Pulmonary/Chest: Effort normal and breath sounds normal. No respiratory distress. Abdominal: Soft.  There is no tenderness. Normal rectal exam, no lesions, hemorrhoids or masses. Normal tonus  Psychiatric: Patient has a normal mood and affect. behavior is normal. Judgment and thought content normal.    PHQ2/9: Depression screen [facility] 2/9 04/12/2021 12/18/2020 10/10/2020 05/02/2020 04/23/2020  Decreased Interest 0 0 0 0 0  Down, Depressed, Hopeless 0 0 0 0 0  PHQ - 2 Score 0 0 0 0 0  Altered sleeping 0 0 - - -  Tired, decreased energy 0 0 - - -  Change in appetite 0 0 - - -  Feeling bad or failure about yourself  0 0 - - -  Trouble concentrating 0 0 - - -  Moving slowly or fidgety/restless 0 0 - - -  Suicidal thoughts 0 0 - - -  PHQ-9 Score 0 0 - - -  Difficult doing work/chores - Not difficult at all - - -    phq 9 is negative   Fall Risk: Fall Risk  04/12/2021 12/18/2020 10/10/2020 05/02/2020 04/23/2020  Falls in the past year? 0 0 0 0 0  Number falls in past yr: 0 0 0 0 0  Injury with Fall? 0 0 0 0 0  Risk for fall due to : No Fall Risks - - - -  Follow up Falls prevention discussed - - - -      Functional Status Survey: Is the patient deaf or have difficulty hearing?: No Does the patient have difficulty seeing, even when wearing glasses/contacts?: No Does the patient have difficulty concentrating, remembering, or making decisions?: No Does the patient have difficulty walking or climbing stairs?: No Does  the patient have difficulty dressing or bathing?: No Does the patient have difficulty doing errands alone such as visiting a doctor's office or shopping?: No    Assessment & Plan  1. Morbid obesity (HCC)  - metFORMIN (GLUCOPHAGE XR) 750 MG 24 hr tablet; Take 1 tablet (750 mg total) by mouth daily with breakfast.  Dispense: 90 tablet; Refill: 0 - Semaglutide (RYBELSUS) 7 MG TABS; Take 7 mg by mouth daily.  Dispense: 90 tablet; Refill: 0  2. Mild intermittent asthma without complication   3. Need  for immunization against influenza  - Flu Vaccine QUAD 6+ mos PF IM (Fluarix Quad PF)  4. Primary hypertension  - valsartan-hydrochlorothiazide (DIOVAN HCT) 80-12.5 MG tablet; Take 1 tablet by mouth daily.  Dispense: 90 tablet; Refill: 0 - COMPLETE METABOLIC PANEL WITH GFR - CBC with Differential/Platelet  5. Pre-diabetes  - metFORMIN (GLUCOPHAGE XR) 750 MG 24 hr tablet; Take 1 tablet (750 mg total) by mouth daily with breakfast.  Dispense: 90 tablet; Refill: 0 - Semaglutide (RYBELSUS) 7 MG TABS; Take 7 mg by mouth daily.  Dispense: 90 tablet; Refill: 0 - Hemoglobin A1c  6. Lipid screening  - Lipid panel  7. Mild intermittent asthma with acute exacerbation  - albuterol (VENTOLIN HFA) 108 (90 Base) MCG/ACT inhaler; Inhale 2 puffs into the lungs every 4 (four) hours as needed for wheezing or shortness of breath.  Dispense: 18 g; Refill: 0  8. Routine screening for STI (sexually transmitted infection)  - RPR - HIV Antibody (routine testing w rflx) - Cytology (oral, anal, urethral) ancillary only   9. Rectal pain  Intermittent since Monday, advised to monitor , make sure not straining but to let me know if persists and I will refer him to GI

## 2021-04-12 ENCOUNTER — Other Ambulatory Visit (HOSPITAL_COMMUNITY)
Admission: RE | Admit: 2021-04-12 | Discharge: 2021-04-12 | Disposition: A | Discharge status: Home / Self Care | Payer: BLUE CROSS/BLUE SHIELD | Source: Ambulatory Visit | Attending: Family Medicine | Admitting: Family Medicine

## 2021-04-12 ENCOUNTER — Other Ambulatory Visit: Payer: Self-pay

## 2021-04-12 ENCOUNTER — Ambulatory Visit (INDEPENDENT_AMBULATORY_CARE_PROVIDER_SITE_OTHER): Payer: BLUE CROSS/BLUE SHIELD | Admitting: Family Medicine

## 2021-04-12 ENCOUNTER — Encounter: Payer: Self-pay | Admitting: Family Medicine

## 2021-04-12 DIAGNOSIS — Z13220 Encounter for screening for lipoid disorders: Secondary | ICD-10-CM

## 2021-04-12 DIAGNOSIS — Z23 Encounter for immunization: Secondary | ICD-10-CM | POA: Diagnosis not present

## 2021-04-12 DIAGNOSIS — J4521 Mild intermittent asthma with (acute) exacerbation: Secondary | ICD-10-CM

## 2021-04-12 DIAGNOSIS — J4520 Mild intermittent asthma, uncomplicated: Secondary | ICD-10-CM | POA: Diagnosis not present

## 2021-04-12 DIAGNOSIS — I10 Essential (primary) hypertension: Secondary | ICD-10-CM

## 2021-04-12 DIAGNOSIS — K6289 Other specified diseases of anus and rectum: Secondary | ICD-10-CM

## 2021-04-12 DIAGNOSIS — R7303 Prediabetes: Secondary | ICD-10-CM

## 2021-04-12 DIAGNOSIS — Z113 Encounter for screening for infections with a predominantly sexual mode of transmission: Secondary | ICD-10-CM

## 2021-04-12 MED ORDER — RYBELSUS 7 MG PO TABS: 7.0000 mg | ORAL_TABLET | Freq: Every day | ORAL | 0 refills | Status: DC

## 2021-04-12 MED ORDER — ALBUTEROL SULFATE HFA 108 (90 BASE) MCG/ACT IN AERS: 2.0000 | INHALATION_SPRAY | RESPIRATORY_TRACT | 0 refills | Status: DC | PRN

## 2021-04-12 MED ORDER — VALSARTAN-HYDROCHLOROTHIAZIDE 80-12.5 MG PO TABS: 1.0000 | ORAL_TABLET | Freq: Every day | ORAL | 0 refills | Status: DC

## 2021-04-12 MED ORDER — METFORMIN HCL ER 750 MG PO TB24
750.0000 mg | ORAL_TABLET | Freq: Every day | ORAL | 0 refills | Status: DC
Start: 2021-04-12 — End: 2021-08-30

## 2021-04-15 LAB — CYTOLOGY, (ORAL, ANAL, URETHRAL) ANCILLARY ONLY
Chlamydia: NEGATIVE
Comment: NEGATIVE
Comment: NORMAL
Neisseria Gonorrhea: NEGATIVE

## 2021-04-16 ENCOUNTER — Other Ambulatory Visit: Payer: Self-pay

## 2021-04-16 DIAGNOSIS — D649 Anemia, unspecified: Secondary | ICD-10-CM

## 2021-04-16 NOTE — Progress Notes (Signed)
Iron study added on.

## 2021-04-17 LAB — COMPLETE METABOLIC PANEL WITH GFR
AG Ratio: 1.1 (calc) (ref 1.0–2.5)
ALT: 10 U/L (ref 9–46)
AST: 12 U/L (ref 10–40)
Albumin: 3.8 g/dL (ref 3.6–5.1)
Alkaline phosphatase (APISO): 72 U/L (ref 36–130)
BUN: 17 mg/dL (ref 7–25)
CO2: 28 mmol/L (ref 20–32)
Calcium: 9.4 mg/dL (ref 8.6–10.3)
Chloride: 107 mmol/L (ref 98–110)
Creat: 0.84 mg/dL (ref 0.60–1.24)
Globulin: 3.6 g/dL (calc) (ref 1.9–3.7)
Glucose, Bld: 97 mg/dL (ref 65–99)
Potassium: 4.3 mmol/L (ref 3.5–5.3)
Sodium: 143 mmol/L (ref 135–146)
Total Bilirubin: 0.6 mg/dL (ref 0.2–1.2)
Total Protein: 7.4 g/dL (ref 6.1–8.1)
eGFR: 122 mL/min/{1.73_m2} (ref >=60)

## 2021-04-17 LAB — CBC WITH DIFFERENTIAL/PLATELET
Absolute Monocytes: 432 cells/uL (ref 200–950)
Basophils Absolute: 29 cells/uL (ref 0–200)
Basophils Relative: 0.4 %
Eosinophils Absolute: 79 cells/uL (ref 15–500)
Eosinophils Relative: 1.1 %
HCT: 36 % — ABNORMAL LOW (ref 38.5–50.0)
Hemoglobin: 11.5 g/dL — ABNORMAL LOW (ref 13.2–17.1)
Lymphs Abs: 1663 cells/uL (ref 850–3900)
MCH: 23.1 pg — ABNORMAL LOW (ref 27.0–33.0)
MCHC: 31.9 g/dL — ABNORMAL LOW (ref 32.0–36.0)
MCV: 72.4 fL — ABNORMAL LOW (ref 80.0–100.0)
MPV: 12.1 fL (ref 7.5–12.5)
Monocytes Relative: 6 %
Neutro Abs: 4997 cells/uL (ref 1500–7800)
Neutrophils Relative %: 69.4 %
Platelets: 254 10*3/uL (ref 140–400)
RBC: 4.97 10*6/uL (ref 4.20–5.80)
RDW: 15.2 % — ABNORMAL HIGH (ref 11.0–15.0)
Total Lymphocyte: 23.1 %
WBC: 7.2 10*3/uL (ref 3.8–10.8)

## 2021-04-17 LAB — LIPID PANEL
Cholesterol: 148 mg/dL (ref <200)
HDL: 40 mg/dL (ref >=40)
LDL Cholesterol (Calc): 92 mg/dL (calc)
Non-HDL Cholesterol (Calc): 108 mg/dL (calc) (ref <130)
Total CHOL/HDL Ratio: 3.7 (calc) (ref <5.0)
Triglycerides: 72 mg/dL (ref <150)

## 2021-04-17 LAB — TEST AUTHORIZATION

## 2021-04-17 LAB — HEMOGLOBIN A1C
Hgb A1c MFr Bld: 5.8 % of total Hgb — ABNORMAL HIGH (ref <5.7)
Mean Plasma Glucose: 120 mg/dL
eAG (mmol/L): 6.6 mmol/L

## 2021-04-17 LAB — IRON,TIBC AND FERRITIN PANEL
%SAT: 18 % (calc) — ABNORMAL LOW (ref 20–48)
Ferritin: 65 ng/mL (ref 38–380)
Iron: 46 ug/dL — ABNORMAL LOW (ref 50–195)
TIBC: 260 mcg/dL (calc) (ref 250–425)

## 2021-04-17 LAB — RPR: RPR Ser Ql: NONREACTIVE

## 2021-04-17 LAB — HIV ANTIBODY (ROUTINE TESTING W REFLEX): HIV 1&2 Ab, 4th Generation: NONREACTIVE

## 2021-07-07 ENCOUNTER — Other Ambulatory Visit: Payer: Self-pay | Admitting: Family Medicine

## 2021-07-07 DIAGNOSIS — I10 Essential (primary) hypertension: Secondary | ICD-10-CM

## 2021-07-31 NOTE — Patient Instructions (Signed)
Preventive Care 21-29 Years Old, Male ?Preventive care refers to lifestyle choices and visits with your health care provider that can promote health and wellness. Preventive care visits are also called wellness exams. ?What can I expect for my preventive care visit? ?Counseling ?During your preventive care visit, your health care provider may ask about your: ?Medical history, including: ?Past medical problems. ?Family medical history. ?Current health, including: ?Emotional well-being. ?Home life and relationship well-being. ?Sexual activity. ?Lifestyle, including: ?Alcohol, nicotine or tobacco, and drug use. ?Access to firearms. ?Diet, exercise, and sleep habits. ?Safety issues such as seatbelt and bike helmet use. ?Sunscreen use. ?Work and work environment. ?Physical exam ?Your health care provider may check your: ?Height and weight. These may be used to calculate your BMI (body mass index). BMI is a measurement that tells if you are at a healthy weight. ?Waist circumference. This measures the distance around your waistline. This measurement also tells if you are at a healthy weight and may help predict your risk of certain diseases, such as type 2 diabetes and high blood pressure. ?Heart rate and blood pressure. ?Body temperature. ?Skin for abnormal spots. ?What immunizations do I need? ?Vaccines are usually given at various ages, according to a schedule. Your health care provider will recommend vaccines for you based on your age, medical history, and lifestyle or other factors, such as travel or where you work. ?What tests do I need? ?Screening ?Your health care provider may recommend screening tests for certain conditions. This may include: ?Lipid and cholesterol levels. ?Diabetes screening. This is done by checking your blood sugar (glucose) after you have not eaten for a while (fasting). ?Hepatitis B test. ?Hepatitis C test. ?HIV (human immunodeficiency virus) test. ?STI (sexually transmitted infection)  testing, if you are at risk. ?Talk with your health care provider about your test results, treatment options, and if necessary, the need for more tests. ?Follow these instructions at home: ?Eating and drinking ? ?Eat a healthy diet that includes fresh fruits and vegetables, whole grains, lean protein, and low-fat dairy products. ?Drink enough fluid to keep your urine pale yellow. ?Take vitamin and mineral supplements as recommended by your health care provider. ?Do not drink alcohol if your health care provider tells you not to drink. ?If you drink alcohol: ?Limit how much you have to 0-2 drinks a day. ?Know how much alcohol is in your drink. In the [place] one drink equals one 12 oz bottle of beer (355 mL), one 5 oz glass of wine (148 mL), or one 1? oz glass of hard liquor (44 mL). ?Lifestyle ?Brush your teeth every morning and night with fluoride toothpaste. Floss one time each day. ?Exercise for at least 30 minutes 5 or more days each week. ?Do not use any products that contain nicotine or tobacco. These products include cigarettes, chewing tobacco, and vaping devices, such as e-cigarettes. If you need help quitting, ask your health care provider. ?Do not use drugs. ?If you are sexually active, practice safe sex. Use a condom or other form of protection to prevent STIs. ?Find healthy ways to manage stress, such as: ?Meditation, yoga, or listening to music. ?Journaling. ?Talking to a trusted person. ?Spending time with friends and family. ?Minimize exposure to UV radiation to reduce your risk of skin cancer. ?Safety ?Always wear your seat belt while driving or riding in a vehicle. ?Do not drive: ?If you have been drinking alcohol. Do not ride with someone who has been drinking. ?If you have been using any mind-altering substances or   drugs. ?While texting. ?When you are tired or distracted. ?Wear a helmet and other protective equipment during sports activities. ?If you have firearms in your house, make sure you  follow all gun safety procedures. ?Seek help if you have been physically or sexually abused. ?What's next? ?Go to your health care provider once a year for an annual wellness visit. ?Ask your health care provider how often you should have your eyes and teeth checked. ?Stay up to date on all vaccines. ?This information is not intended to replace advice given to you by your health care provider. Make sure you discuss any questions you have with your health care provider. ?Document Revised: 11/07/2020 Document Reviewed: 11/07/2020 ?Elsevier Patient Education ? 2022 Elsevier Inc. ? ?

## 2021-07-31 NOTE — Progress Notes (Signed)
Name: [name]   MRN: [id number]    DOB: July 21, 1990   Date:08/01/2021       Progress Note  Subjective  Chief Complaint  Annual Exam  HPI  Patient presents for annual CPE .  Diet: no longer drinking sodas or sweet beverages , eating more vegetables and lean meat, packing lunch  Exercise: he is thinking about joining a gym   Depression: phq 9 is negative Depression screen [facility] 2/9 08/01/2021 04/12/2021 12/18/2020 10/10/2020 05/02/2020  Decreased Interest 0 0 0 0 0  Down, Depressed, Hopeless 0 0 0 0 0  PHQ - 2 Score 0 0 0 0 0  Altered sleeping 0 0 0 - -  Tired, decreased energy 0 0 0 - -  Change in appetite 0 0 0 - -  Feeling bad or failure about yourself  0 0 0 - -  Trouble concentrating 0 0 0 - -  Moving slowly or fidgety/restless 0 0 0 - -  Suicidal thoughts 0 0 0 - -  PHQ-9 Score 0 0 0 - -  Difficult doing work/chores Not difficult at all - Not difficult at all - -    Hypertension:  BP Readings from Last 3 Encounters:  08/01/21 126/82  04/12/21 132/80  10/10/20 136/82    Obesity: Wt Readings from Last 3 Encounters:  08/01/21 (!) 474 lb 4.8 oz (215.1 kg)  04/12/21 (!) 477 lb (216.4 kg)  12/18/20 (!) 469 lb (212.7 kg)   BMI Readings from Last 3 Encounters:  08/01/21 57.73 kg/m  04/12/21 58.06 kg/m  12/18/20 57.09 kg/m     Lipids:  Lab Results  Component Value Date   CHOL 148 04/12/2021   CHOL 155 05/02/2020   CHOL 168 04/19/2018   Lab Results  Component Value Date   HDL 40 04/12/2021   HDL 40 05/02/2020   HDL 38 (L) 04/19/2018   Lab Results  Component Value Date   LDLCALC 92 04/12/2021   LDLCALC 94 05/02/2020   LDLCALC 113 (H) 04/19/2018   Lab Results  Component Value Date   TRIG 72 04/12/2021   TRIG 116 05/02/2020   TRIG 74 04/19/2018   Lab Results  Component Value Date   CHOLHDL 3.7 04/12/2021   CHOLHDL 3.9 05/02/2020   CHOLHDL 4.4 04/19/2018   No results found for: LDLDIRECT Glucose:  Glucose, Bld  Date Value Ref Range Status   04/12/2021 97 65 - 99 mg/dL Final    Comment:    .            Fasting reference interval .   05/02/2020 88 65 - 99 mg/dL Final    Comment:    .            Fasting reference interval .   02/10/2019 103 (H) 70 - 99 mg/dL Final   Glucose-Capillary  Date Value Ref Range Status  02/11/2019 110 (H) 70 - 99 mg/dL Final  02/11/2019 94 70 - 99 mg/dL Final  02/10/2019 117 (H) 70 - 99 mg/dL Final    Flowsheet Row Office Visit from 08/01/2021 in [facility]  AUDIT-C Score 0      Single STD testing and prevention (HIV/chl/gon/syphilis):  04/12/21 Hep C Screening: 05/02/20 Skin cancer: Discussed monitoring for atypical lesions Colorectal cancer: N/A Prostate cancer:  not applicable    Lung cancer:  Low Dose CT Chest recommended if Age 33-80 years, 30 pack-year currently smoking OR have quit w/in 15years. Patient  no AAA: The USPSTF recommends one-time  screening with ultrasonography in men ages 72 to 71 years who have ever smoked. Patient:  not applicable  ECG:  [id number]  Vaccines:   HPV: up to date Tdap: 12/25/10 he is due today  Shingrix: N/A Pneumonia: N/A Flu: up to date COVID-19: bivalent booster advised   Advanced Care Planning: A voluntary discussion about advance care planning including the explanation and discussion of advance directives.  Discussed health care proxy and Living will, and the patient was able to identify a health care proxy as mother .  Patient does not   Patient Active Problem List   Diagnosis Date Noted   Pre-diabetes 04/12/2021   Primary hypertension 04/12/2021   COVID-19 virus infection 02/07/2019   Asthma, mild intermittent    Morbid obesity ([place]) 02/08/2008    History reviewed. No pertinent surgical history.  Family History  Problem Relation Age of Onset   Obesity Mother    Kidney disease Father        mass   Liver cancer Father    Obesity Sister     Social History   Socioeconomic History   Marital status: Single     Spouse name: Not on file   Number of children: 0   Years of education: Not on file   Highest education level: Some college, no degree  Occupational History   Occupation: Hospital doctor   Tobacco Use   Smoking status: Never   Smokeless tobacco: Never  Vaping Use   Vaping Use: Never used  Substance and Sexual Activity   Alcohol use: Yes    Alcohol/week: 1.0 standard drink    Types: 1 Cans of beer per week   Drug use: Not Currently   Sexual activity: Yes    Partners: Female    Comment: not always   Other Topics Concern   Not on file  Social History Narrative   Not on file   Social Determinants of Health   Financial Resource Strain: Low Risk    Difficulty of Paying Living Expenses: Not hard at all  Food Insecurity: No Food Insecurity   Worried About Charity fundraiser in the Last Year: Never true   [place] in the Last Year: Never true  Transportation Needs: No Transportation Needs   Lack of Transportation (Medical): No   Lack of Transportation (Non-Medical): No  Physical Activity: Insufficiently Active   Days of Exercise per Week: 2 days   Minutes of Exercise per Session: 20 min  Stress: No Stress Concern Present   Feeling of Stress : Not at all  Social Connections: Moderately Isolated   Frequency of Communication with Friends and Family: More than three times a week   Frequency of Social Gatherings with Friends and Family: More than three times a week   Attends Religious Services: Never   Marine scientist or Organizations: Yes   Attends Archivist Meetings: Never   Marital Status: Never married  Human resources officer Violence: Not At Risk   Fear of Current or Ex-Partner: No   Emotionally Abused: No   Physically Abused: No   Sexually Abused: No     Current Outpatient Medications:    albuterol (VENTOLIN HFA) 108 (90 Base) MCG/ACT inhaler, Inhale 2 puffs into the lungs every 4 (four) hours as needed for wheezing or shortness of  breath., Disp: 18 g, Rfl: 0   fluticasone furoate-vilanterol (BREO ELLIPTA) 100-25 MCG/INH AEPB, Inhale 1 puff into the lungs daily., Disp: 60 each, Rfl: 2  metFORMIN (GLUCOPHAGE XR) 750 MG 24 hr tablet, Take 1 tablet (750 mg total) by mouth daily with breakfast., Disp: 90 tablet, Rfl: 0   Semaglutide (RYBELSUS) 7 MG TABS, Take 7 mg by mouth daily., Disp: 90 tablet, Rfl: 0   valsartan-hydrochlorothiazide (DIOVAN HCT) 80-12.5 MG tablet, Take 1 tablet by mouth daily., Disp: 90 tablet, Rfl: 0   azelastine (ASTELIN) 0.1 % nasal spray, Place 2 sprays into both nostrils 2 (two) times daily. Use in each nostril as directed (Patient not taking: Reported on 08/01/2021), Disp: 30 mL, Rfl: 2   fluticasone (FLONASE) 50 MCG/ACT nasal spray, Place 2 sprays into both nostrils daily. (Patient not taking: Reported on 08/01/2021), Disp: 16 g, Rfl: 2  No Known Allergies   ROS  Constitutional: Negative for fever or weight change.  Respiratory: Negative for cough and shortness of breath.   Cardiovascular: Negative for chest pain or palpitations.  Gastrointestinal: Negative for abdominal pain, no bowel changes.  Musculoskeletal: Negative for gait problem or joint swelling.  Skin: Negative for rash.  Neurological: Negative for dizziness or headache.  No other specific complaints in a complete review of systems (except as listed in HPI above).   Objective  Vitals:   08/01/21 0822  BP: 126/82  Pulse: 91  Resp: 16  Temp: 97.8 F (36.6 C)  TempSrc: Oral  SpO2: 95%  Weight: (!) 474 lb 4.8 oz (215.1 kg)  Height: 6\' 4"  (1.93 m)    Body mass index is 57.73 kg/m.  Physical Exam  Constitutional: Patient appears well-developed and well-nourished. No distress.  HENT: Head: Normocephalic and atraumatic. Ears: B TMs ok,  some wax on right ear canal, no erythema or effusion; Nose: Not done . Mouth/Throat: not done  Eyes: Conjunctivae and EOM are normal. Pupils are equal, round, and reactive to light. No scleral  icterus.  Neck: Normal range of motion. Neck supple. No JVD present. No thyromegaly present.  Cardiovascular: Normal rate, regular rhythm and normal heart sounds.  No murmur heard. No BLE edema. Pulmonary/Chest: Effort normal and breath sounds normal. No respiratory distress. Abdominal: Soft. Bowel sounds are normal, no distension. There is no tenderness. no masses MALE GENITALIA: Normal descended testes bilaterally, no masses palpated, no hernias, no lesions, no discharge RECTAL:not done  Musculoskeletal: Normal range of motion, no joint effusions. No gross deformities Neurological: he is alert and oriented to person, place, and time. No cranial nerve deficit. Coordination, balance, strength, speech and gait are normal.  Skin: Skin is warm and dry. No rash noted. No erythema.  Psychiatric: Patient has a normal mood and affect. behavior is normal. Judgment and thought content normal.   Fall Risk: Fall Risk  08/01/2021 04/12/2021 12/18/2020 10/10/2020 05/02/2020  Falls in the past year? 0 0 0 0 0  Number falls in past yr: 0 0 0 0 0  Injury with Fall? 0 0 0 0 0  Risk for fall due to : No Fall Risks No Fall Risks - - -  Follow up Falls prevention discussed Falls prevention discussed - - -     Functional Status Survey: Is the patient deaf or have difficulty hearing?: No Does the patient have difficulty seeing, even when wearing glasses/contacts?: No Does the patient have difficulty concentrating, remembering, or making decisions?: No Does the patient have difficulty walking or climbing stairs?: No Does the patient have difficulty dressing or bathing?: No Does the patient have difficulty doing errands alone such as visiting a doctor's office or shopping?: No    Assessment &  Plan  1. Well adult exam  He states rectal pain resolved and he did not go to GI, we will recheck CBC next time we do labs  Discussed weight loss and options on how to exercise  2. Need for Tdap vaccination  - Tdap  vaccine greater than or equal to 7yo IM    -Prostate cancer screening and PSA options (with potential risks and benefits of testing vs not testing) were discussed along with recent recs/guidelines. -USPSTF grade A and B recommendations reviewed with patient; age-appropriate recommendations, preventive care, screening tests, etc discussed and encouraged; healthy living encouraged; see AVS for patient education given to patient -Discussed importance of 150 minutes of physical activity weekly, eat two servings of fish weekly, eat one serving of tree nuts ( cashews, pistachios, pecans, almonds.Marland Kitchen) every other day, eat 6 servings of fruit/vegetables daily and drink plenty of water and avoid sweet beverages.  -Reviewed Health Maintenance: yes

## 2021-08-01 ENCOUNTER — Ambulatory Visit (INDEPENDENT_AMBULATORY_CARE_PROVIDER_SITE_OTHER): Payer: Federal, State, Local not specified - PPO | Admitting: Family Medicine

## 2021-08-01 ENCOUNTER — Encounter: Payer: Self-pay | Admitting: Family Medicine

## 2021-08-01 VITALS — BP 126/82 | HR 91 | Temp 97.8°F | Resp 16 | Ht 76.0 in | Wt >= 6400 oz

## 2021-08-01 DIAGNOSIS — Z23 Encounter for immunization: Secondary | ICD-10-CM | POA: Diagnosis not present

## 2021-08-01 DIAGNOSIS — Z0000 Encounter for general adult medical examination without abnormal findings: Secondary | ICD-10-CM | POA: Diagnosis not present

## 2021-08-26 DIAGNOSIS — J029 Acute pharyngitis, unspecified: Secondary | ICD-10-CM | POA: Diagnosis not present

## 2021-08-26 DIAGNOSIS — Z03818 Encounter for observation for suspected exposure to other biological agents ruled out: Secondary | ICD-10-CM | POA: Diagnosis not present

## 2021-08-29 NOTE — Progress Notes (Signed)
Name: [name]   MRN: [id number]    DOB: [birth year]   Date:08/30/2021 ? ?     Progress Note ? ?Subjective ? ?Chief Complaint ? ?Follow Up ? ?HPI ? ?Asthma intermittent with acute exacerbation / URI: he was doing well, however 5 days ago developed acute onset of severe sore throat , he felt fatigued at work and left early , he went to Urgent Care and had negative COVID-19, RSV and flu tests. He states now has chest congestion, cough that is productive and yellowish-green, no fever or chills. Taking Cefdnir given by urgent care. Appetite is normal. He is not wheezing. Not taking any otc medications. Sore throat improved. Advised to start mucinex DM, continue antibiotics and resume Breo daily  ? ?Morbid obesity:  His weight at age 3 was 312 lbs.He has been taking Metformin and we added Rybelsus in the Fall, he took started kit but states 7 mg dose was not at the pharmacy. He states while on the 3 mg he felt his appetite had decreased. He is eating mostly at home, avoiding fried food, eating out only once a week. Weight is stable. We will send refill of Rybelsus to pharmacy  ? ?HTN: we started him on HCTZ Dec 2021 but today is at goal, no chest pain , palpitation of sob.  ? ?Pre-diabetes: last A1C 5.8  Nov 22 . He denies polyphagia, polydipsia or polyuria , he is taking Metformin and making better meal choices ? ?Patient Active Problem List  ? Diagnosis Date Noted  ? Pre-diabetes 04/12/2021  ? Primary hypertension 04/12/2021  ? COVID-19 virus infection 02/07/2019  ? Asthma, mild intermittent   ? Morbid obesity ([place]) 02/08/2008  ? ? ?No past surgical history on file. ? ?Family History  ?Problem Relation Age of Onset  ? Obesity Mother   ? Kidney disease Father   ?     mass  ? Liver cancer Father   ? Obesity Sister   ? ? ?Social History  ? ?Tobacco Use  ? Smoking status: Never  ? Smokeless tobacco: Never  ?Substance Use Topics  ? Alcohol use: Yes  ?  Alcohol/week: 1.0 standard drink  ?  Types: 1 Cans of beer per week   ? ? ? ?Current Outpatient Medications:  ?  albuterol (VENTOLIN HFA) 108 (90 Base) MCG/ACT inhaler, Inhale 2 puffs into the lungs every 4 (four) hours as needed for wheezing or shortness of breath., Disp: 18 g, Rfl: 0 ?  azelastine (ASTELIN) 0.1 % nasal spray, Place 2 sprays into both nostrils 2 (two) times daily. Use in each nostril as directed, Disp: 30 mL, Rfl: 2 ?  cefdinir (OMNICEF) 300 MG capsule, Take 300 mg by mouth 2 (two) times daily., Disp: , Rfl:  ?  fluticasone (FLONASE) 50 MCG/ACT nasal spray, Place 2 sprays into both nostrils daily., Disp: 16 g, Rfl: 2 ?  fluticasone furoate-vilanterol (BREO ELLIPTA) 100-25 MCG/INH AEPB, Inhale 1 puff into the lungs daily., Disp: 60 each, Rfl: 2 ?  metFORMIN (GLUCOPHAGE XR) 750 MG 24 hr tablet, Take 1 tablet (750 mg total) by mouth daily with breakfast., Disp: 90 tablet, Rfl: 0 ?  valsartan-hydrochlorothiazide (DIOVAN HCT) 80-12.5 MG tablet, Take 1 tablet by mouth daily., Disp: 90 tablet, Rfl: 0 ?  Semaglutide (RYBELSUS) 7 MG TABS, Take 7 mg by mouth daily. (Patient not taking: Reported on 08/30/2021), Disp: 90 tablet, Rfl: 0 ? ?No Known Allergies ? ?I personally reviewed active problem list, medication list, allergies, family history, social history,  health maintenance with the patient/caregiver today. ? ? ?ROS ? ?Constitutional: Negative for fever or weight change.  ?Respiratory: Positive  for cough but no shortness of breath.   ?Cardiovascular: Negative for chest pain or palpitations.  ?Gastrointestinal: Negative for abdominal pain, no bowel changes.  ?Musculoskeletal: Negative for gait problem or joint swelling.  ?Skin: Negative for rash.  ?Neurological: Negative for dizziness or headache.  ?No other specific complaints in a complete review of systems (except as listed in HPI above).  ? ?Objective ? ?Vitals:  ? 08/30/21 0735  ?BP: 122/84  ?Pulse: 93  ?Resp: 16  ?SpO2: 98%  ?Weight: (!) 475 lb (215.5 kg)  ?Height: 6' 4"  (1.93 m)  ? ? ?Body mass index is 57.82  kg/m?. ? ?Physical Exam ? ?Constitutional: Patient appears well-developed and well-nourished. Obese  No distress.  ?HEENT: head atraumatic, normocephalic, pupils equal and reactive to light, ears normal TM, neck supple, throat within normal limits ?Cardiovascular: Normal rate, regular rhythm and normal heart sounds.  No murmur heard. No BLE edema. ?Pulmonary/Chest: Effort normal and breath sounds normal. No respiratory distress. ?Abdominal: Soft.  There is no tenderness. ?Psychiatric: Patient has a normal mood and affect. behavior is normal. Judgment and thought content normal.  ? ?PHQ2/9: ? ?  08/30/2021  ?  7:34 AM 08/01/2021  ?  8:17 AM 04/12/2021  ?  8:04 AM 12/18/2020  ? 10:25 AM 10/10/2020  ? 11:40 AM  ?Depression screen PHQ 2/9  ?Decreased Interest 0 0 0 0 0  ?Down, Depressed, Hopeless 0 0 0 0 0  ?PHQ - 2 Score 0 0 0 0 0  ?Altered sleeping 0 0 0 0   ?Tired, decreased energy 0 0 0 0   ?Change in appetite 0 0 0 0   ?Feeling bad or failure about yourself  0 0 0 0   ?Trouble concentrating 0 0 0 0   ?Moving slowly or fidgety/restless 0 0 0 0   ?Suicidal thoughts 0 0 0 0   ?PHQ-9 Score 0 0 0 0   ?Difficult doing work/chores  Not difficult at all  Not difficult at all   ?  ?phq 9 is negative ? ? ?Fall Risk: ? ?  08/30/2021  ?  7:34 AM 08/01/2021  ?  8:16 AM 04/12/2021  ?  8:04 AM 12/18/2020  ? 10:25 AM 10/10/2020  ? 11:39 AM  ?Fall Risk   ?Falls in the past year? 0 0 0 0 0  ?Number falls in past yr: 0 0 0 0 0  ?Injury with Fall? 0 0 0 0 0  ?Risk for fall due to : No Fall Risks No Fall Risks No Fall Risks    ?Follow up Falls prevention discussed Falls prevention discussed Falls prevention discussed    ? ? ? ? ?Functional Status Survey: ?Is the patient deaf or have difficulty hearing?: No ?Does the patient have difficulty seeing, even when wearing glasses/contacts?: No ?Does the patient have difficulty concentrating, remembering, or making decisions?: No ?Does the patient have difficulty walking or climbing stairs?: No ?Does the  patient have difficulty dressing or bathing?: No ?Does the patient have difficulty doing errands alone such as visiting a doctor's office or shopping?: No ? ? ? ?Assessment & Plan ? ?1. Pre-diabetes ? ?- Semaglutide (RYBELSUS) 7 MG TABS; Take 7 mg by mouth daily.  Dispense: 90 tablet; Refill: 0 ?- metFORMIN (GLUCOPHAGE XR) 750 MG 24 hr tablet; Take 1 tablet (750 mg total) by mouth daily with breakfast.  Dispense:  90 tablet; Refill: 0 ? ?2. Primary hypertension ? ?- valsartan-hydrochlorothiazide (DIOVAN HCT) 80-12.5 MG tablet; Take 1 tablet by mouth daily.  Dispense: 90 tablet; Refill: 0 ? ?3. Morbid obesity ([place]) ? ?- Semaglutide (RYBELSUS) 7 MG TABS; Take 7 mg by mouth daily.  Dispense: 90 tablet; Refill: 0 ?- metFORMIN (GLUCOPHAGE XR) 750 MG 24 hr tablet; Take 1 tablet (750 mg total) by mouth daily with breakfast.  Dispense: 90 tablet; Refill: 0 ? ?4. Acute URI ? ?Add Mucinex DM ? ?5. Mild intermittent asthma with acute exacerbation ? ?- albuterol (VENTOLIN HFA) 108 (90 Base) MCG/ACT inhaler; Inhale 2 puffs into the lungs every 4 (four) hours as needed for wheezing or shortness of breath.  Dispense: 18 g; Refill: 0 ?- fluticasone furoate-vilanterol (BREO ELLIPTA) 100-25 MCG/ACT AEPB; Inhale 1 puff into the lungs daily.  Dispense: 1 each; Refill: 2  ?

## 2021-08-30 ENCOUNTER — Encounter: Payer: Self-pay | Admitting: Family Medicine

## 2021-08-30 ENCOUNTER — Ambulatory Visit: Payer: Federal, State, Local not specified - PPO | Admitting: Family Medicine

## 2021-08-30 VITALS — BP 122/84 | HR 93 | Resp 16 | Ht 76.0 in | Wt >= 6400 oz

## 2021-08-30 DIAGNOSIS — I10 Essential (primary) hypertension: Secondary | ICD-10-CM | POA: Diagnosis not present

## 2021-08-30 DIAGNOSIS — J4521 Mild intermittent asthma with (acute) exacerbation: Secondary | ICD-10-CM

## 2021-08-30 DIAGNOSIS — J069 Acute upper respiratory infection, unspecified: Secondary | ICD-10-CM

## 2021-08-30 DIAGNOSIS — R7303 Prediabetes: Secondary | ICD-10-CM | POA: Diagnosis not present

## 2021-08-30 MED ORDER — ALBUTEROL SULFATE HFA 108 (90 BASE) MCG/ACT IN AERS: 2.0000 | INHALATION_SPRAY | RESPIRATORY_TRACT | 0 refills | Status: DC | PRN

## 2021-08-30 MED ORDER — RYBELSUS 7 MG PO TABS: 7.0000 mg | ORAL_TABLET | Freq: Every day | ORAL | 0 refills | Status: DC

## 2021-08-30 MED ORDER — VALSARTAN-HYDROCHLOROTHIAZIDE 80-12.5 MG PO TABS: 1.0000 | ORAL_TABLET | Freq: Every day | ORAL | 0 refills | Status: DC

## 2021-08-30 MED ORDER — METFORMIN HCL ER 750 MG PO TB24: 750.0000 mg | ORAL_TABLET | Freq: Every day | ORAL | 0 refills | Status: DC

## 2021-08-30 MED ORDER — FLUTICASONE FUROATE-VILANTEROL 100-25 MCG/ACT IN AEPB: 1.0000 | INHALATION_SPRAY | Freq: Every day | RESPIRATORY_TRACT | 2 refills | Status: DC

## 2021-09-02 ENCOUNTER — Encounter: Payer: Self-pay | Admitting: Family Medicine

## 2021-09-03 ENCOUNTER — Encounter: Payer: Self-pay | Admitting: Family Medicine

## 2021-09-03 ENCOUNTER — Telehealth: Payer: Self-pay

## 2021-09-03 NOTE — Telephone Encounter (Signed)
Copied from CRM [id number]. Topic: General - Other >> Sep 03, 2021 11:00 AM McGill, Darlina Rumpf wrote: Reason for CRM: Pt stated he needs a return to work note and cannot go back without it.  Pt stated note can be for tomorrow, April 12th. Please make sure it says there are no restrictions. Pt stated if it does not say this, he cannot go back.  Pt requesting a call back.

## 2021-09-04 ENCOUNTER — Telehealth: Payer: Self-pay

## 2021-09-04 ENCOUNTER — Ambulatory Visit: Payer: Federal, State, Local not specified - PPO | Admitting: Physician Assistant

## 2021-09-04 ENCOUNTER — Ambulatory Visit: Payer: Self-pay | Admitting: *Deleted

## 2021-09-04 ENCOUNTER — Encounter: Payer: Self-pay | Admitting: Physician Assistant

## 2021-09-04 VITALS — BP 156/92 | HR 98 | Temp 98.5°F | Resp 20 | Ht 76.0 in | Wt >= 6400 oz

## 2021-09-04 DIAGNOSIS — J069 Acute upper respiratory infection, unspecified: Secondary | ICD-10-CM

## 2021-09-04 DIAGNOSIS — I10 Essential (primary) hypertension: Secondary | ICD-10-CM | POA: Diagnosis not present

## 2021-09-04 DIAGNOSIS — G9339 Other post infection and related fatigue syndromes: Secondary | ICD-10-CM

## 2021-09-04 NOTE — Telephone Encounter (Signed)
Both appts have been scheduled 

## 2021-09-04 NOTE — Telephone Encounter (Signed)
?  Chief Complaint: fatigue ?Symptoms: fatigue, sleepy, sore throat, cough ?Frequency: started 4/2 ?Pertinent Negatives: Patient denies chest pain, fever, SOB, vomiting, diarrhea, bleeding, other areas of pain ?Disposition: [] ED /[] Urgent Care (no appt availability in office) / [x] Appointment(In office/virtual)/ []  [facility]/ [] Home Care/ [] Refused Recommended Disposition /[] [facility]/ []  Follow-up with PCP ?Additional Notes: Prolonged symptoms- fatigue not improving  ?

## 2021-09-04 NOTE — Patient Instructions (Signed)
Your blood pressure was elevated today.  ?If possible please take it at home using an electronic blood pressure cuff for the upper arm ?Record your blood pressure once per day and bring them back with you to your apt so we can make sure you are not developing high blood pressure.  ? ?Incorporating a minimum of 150 minutes (20-30 minutes per day) of moderate intensity physical activity can help improve your heart health and reduce the chances of high blood pressure and other cardiovascular risks. ?Incorporating a heart healthy diet can also help reduce the chances of heart attack and high cholesterol. ? ?

## 2021-09-04 NOTE — Progress Notes (Signed)
? ? ?  ?    Acute Office Visit ? ? ?Patient: [name]   DOB: 1990-09-14   31 y.o. Male  MRN: [id number] ?Visit Date: 09/04/2021 ? 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  ? ? ?CC: URI symptoms and fatigue  ? ?Subjective  ?  ?HPI  ? ?Patient states that symptoms began on Monday April 3rd with sore throat  ?He was seen at [facility] and provided abx for suspected viral infection or strep - unsure per results from this encounter ? ?He has finished the abx at this time and reports continued fatigue  ?He has been taking OTC Mucinex and Dayquil/ Nyquil ?States he tried to return to work today but was feeling too tired to carry out responsibilities - was sent home to rest ? ?Discussed elevated BP today in office- patient states he is taking his BP medications and they were taken today prior to apt ?He has not been taking BP readings at home  ? ?Medications: ?Outpatient Medications Prior to Visit  ?Medication Sig  ? albuterol (VENTOLIN HFA) 108 (90 Base) MCG/ACT inhaler Inhale 2 puffs into the lungs every 4 (four) hours as needed for wheezing or shortness of breath.  ? azelastine (ASTELIN) 0.1 % nasal spray Place 2 sprays into both nostrils 2 (two) times daily. Use in each nostril as directed  ? cefdinir (OMNICEF) 300 MG capsule Take 300 mg by mouth 2 (two) times daily.  ? fluticasone (FLONASE) 50 MCG/ACT nasal spray Place 2 sprays into both nostrils daily.  ? fluticasone furoate-vilanterol (BREO ELLIPTA) 100-25 MCG/ACT AEPB Inhale 1 puff into the lungs daily.  ? metFORMIN (GLUCOPHAGE XR) 750 MG 24 hr tablet Take 1 tablet (750 mg total) by mouth daily with breakfast.  ? Semaglutide (RYBELSUS) 7 MG TABS Take 7 mg by mouth daily.  ? valsartan-hydrochlorothiazide (DIOVAN HCT) 80-12.5 MG tablet Take 1 tablet by mouth daily.  ? ?No facility-administered medications prior to visit.  ? ? ?Review of Systems   ?Constitutional:  Positive for fatigue. Negative for chills, diaphoresis and fever.  ?HENT:  Positive for congestion (resolving) and rhinorrhea. Negative for sinus pressure, sinus pain and sore throat.   ?Respiratory:  Negative for cough and shortness of breath.   ?Cardiovascular:  Negative for chest pain.  ?Gastrointestinal:  Negative for constipation, diarrhea, nausea and vomiting.  ?Musculoskeletal:  Negative for arthralgias and myalgias.  ?Neurological:  Negative for dizziness, light-headedness and headaches.  ? ? ?  Objective  ?  ?There were no vitals taken for this visit. ? ? ?Physical Exam ?Vitals reviewed.  ?Constitutional:   ?   General: He is awake.  ?   Appearance: Normal appearance. He is well-developed and well-groomed. He is morbidly obese.  ?HENT:  ?   Head: Normocephalic and atraumatic.  ?   Right Ear: Hearing, tympanic membrane, ear canal and external ear normal.  ?   Left Ear: Hearing, tympanic membrane, ear canal and external ear normal.  ?   Nose: Nose normal.  ?   Mouth/Throat:  ?   Lips: Pink.  ?   Pharynx: Oropharynx is clear. Uvula midline. No pharyngeal swelling, oropharyngeal exudate, posterior oropharyngeal erythema or uvula swelling.  ?Cardiovascular:  ?   Rate and Rhythm: Normal rate and regular rhythm.  ?   Pulses: Normal pulses.  ?   Heart sounds: Normal heart sounds.  ?Pulmonary:  ?  Effort: Pulmonary effort is normal.  ?   Breath sounds: Normal breath sounds. No decreased breath sounds, wheezing, rhonchi or rales.  ?Musculoskeletal:  ?   Right lower leg: No edema.  ?   Left lower leg: No edema.  ?Lymphadenopathy:  ?   Head:  ?   Right side of head: No submental or submandibular adenopathy.  ?   Left side of head: No submental or submandibular adenopathy.  ?   Upper Body:  ?   Right upper body: No supraclavicular adenopathy.  ?   Left upper body: No supraclavicular adenopathy.  ?Neurological:  ?   Mental Status: He is alert.  ?Psychiatric:     ?   Behavior: Behavior is cooperative.   ?  ? ? ?No results found for any visits on 09/04/21. ? Assessment & Plan  ?  ? ?Problem List Items Addressed This Visit   ? ?  ? Cardiovascular and Mediastinum  ? Primary hypertension  ?  Chronic, appears exacerbated at this time in office  ?BP readings elevated today ?Recommend he take BP readings at home for the next few weeks and bring records to follow up apt  ?Recommend follow up apt in 2-4 weeks to assess BP response to current regimen ?Continue current medications for now ?Follow up in 2-4 weeks with PCP to discuss BP  ? ?  ?  ? ?Other Visit Diagnoses   ? ? Other post infection and related fatigue syndromes    -  Primary ?Acute, resolving ?Patient is recovering from potential viral illness, potentially strep as he was given ABX from [facility] - strep tests results unclear  ?Discussed fatigue and residual symptoms can persist after both viral and strep pharyngitis for several weeks after initial resolution ?Recommend rest, staying well hydrated, OTC medications for further symptom relief ?Provided work note per patient request for him to return Monday without restrictions ?Follow up as needed ?  ? Upper respiratory tract infection, unspecified type     ?Acute, resolving problem  ?Overall symptoms have resolved with exception of fatigue ?He has finished Abx course completely and is taking OTC multi-symptom relief medications ?Recommend continued rest, hydration and OTC medications as needed ?Follow up for persistent or worsening symptoms ?  ? ?  ? ? ? ?Return in about 4 weeks (around 10/02/2021) for HTN. ? ? ?I, Veleta Yamamoto E Aiyonna Lucado, PA-C, have reviewed all documentation for this visit. The documentation on 09/04/21 for the exam, diagnosis, procedures, and orders are all accurate and complete. ? ? ?Breann Losano, MHS, PA-C ?[facility] ?[facility]  ? ? ? ? ? ?

## 2021-09-04 NOTE — Assessment & Plan Note (Signed)
Chronic, appears exacerbated at this time in office  ?BP readings elevated today ?Recommend he take BP readings at home for the next few weeks and bring records to follow up apt  ?Recommend follow up apt in 2-4 weeks to assess BP response to current regimen ?Continue current medications for now ?Follow up in 2-4 weeks with PCP to discuss BP  ? ?

## 2021-09-04 NOTE — Telephone Encounter (Signed)
Summary: still not feeling well  ? Pt called in stating he was recently seen, but is still not feeling better, pt mentioned some type of virus, pt requested if he could get a call back from a nurse. Please advise.   ?  ? ?Reason for Disposition ? [1] MODERATE weakness (i.e., interferes with work, school, normal activities) AND [2] cause unknown  (Exceptions: weakness with acute minor illness, or weakness from poor fluid intake) ? ?Answer Assessment - Initial Assessment Questions ?1. DESCRIPTION: "Describe how you are feeling." ?    Fatigue- unable ?2. SEVERITY: "How bad is it?"  "Can you stand and walk?" ?  - MILD - Feels weak or tired, but does not interfere with work, school or normal activities ?  - MODERATE - Able to stand and walk; weakness interferes with work, school, or normal activities ?  - SEVERE - Unable to stand or walk ?    Lack of energy-mild, feels sleepy  ?3. ONSET:  "When did the weakness begin?" ?    4/2 ?4. CAUSE: "What do you think is causing the weakness?" ?    Viral diagnosis- not really better ?5. MEDICINES: "Have you recently started a new medicine or had a change in the amount of a medicine?" ?    OTC medications ?6. OTHER SYMPTOMS: "Do you have any other symptoms?" (e.g., chest pain, fever, cough, SOB, vomiting, diarrhea, bleeding, other areas of pain) ?    Cough, sore throat- better ?7. PREGNANCY: "Is there any chance you are pregnant?" "When was your last menstrual period?" ? ?Protocols used: Weakness (Generalized) and Fatigue-A-AH ? ?

## 2021-09-11 ENCOUNTER — Ambulatory Visit: Payer: Federal, State, Local not specified - PPO

## 2021-09-11 VITALS — BP 140/78

## 2021-09-11 DIAGNOSIS — Z0130 Encounter for examination of blood pressure without abnormal findings: Secondary | ICD-10-CM

## 2021-09-13 ENCOUNTER — Telehealth: Payer: Self-pay

## 2021-09-13 NOTE — Telephone Encounter (Signed)
Pt is returning Sandra's call ?Please advise  ?CB- [phone] ?

## 2021-09-13 NOTE — Telephone Encounter (Signed)
Left voicemail, awaiting return call to go over [facility] paperwork.  ?

## 2021-09-16 ENCOUNTER — Telehealth: Payer: Self-pay | Admitting: Family Medicine

## 2021-09-16 NOTE — Telephone Encounter (Signed)
Pt is calling to let Katharine Look know that some of the pages of his FMLA paperwork have a black mark on them. Asking if these can be refaxed? ? ?Page 3, page 5, page 8 ? ?CB- [phone] ?

## 2021-09-16 NOTE — Telephone Encounter (Signed)
Completed.

## 2021-09-16 NOTE — Telephone Encounter (Signed)
Spoke with patient and clarified information.

## 2021-09-16 NOTE — Telephone Encounter (Signed)
Copied from CRM [id number]. Topic: General - Inquiry ?>> Sep 16, 2021 10:58 AM Benay Pike, CMA wrote: ?Reason for CRM: Information needed to fax FMLA paperwork (fax/contact information) ?

## 2021-09-23 NOTE — Telephone Encounter (Signed)
Printed and refaxed.

## 2021-10-09 ENCOUNTER — Ambulatory Visit: Payer: Federal, State, Local not specified - PPO | Admitting: Physician Assistant

## 2021-10-09 ENCOUNTER — Encounter: Payer: Self-pay | Admitting: Physician Assistant

## 2021-10-09 VITALS — BP 130/78 | HR 81 | Resp 16 | Ht 76.0 in | Wt >= 6400 oz

## 2021-10-09 DIAGNOSIS — I10 Essential (primary) hypertension: Secondary | ICD-10-CM | POA: Diagnosis not present

## 2021-10-09 NOTE — Patient Instructions (Addendum)
To assist with the swelling in your lower legs I recommend the following: ?Elevating your legs above the level of your heart when you can ?Using compression stockings with at least 15-20 mmHg compression force during the day to help with circulation ? ?I recommend getting 150 minutes of moderate intensity physical activity per week- walking for 20 minutes per day is an excellent start  ? ?Keep taking your medications and let us know if you have any concerns ? ?

## 2021-10-09 NOTE — Assessment & Plan Note (Signed)
Chronic, appears to be controlled today in office ?Currently taking Valsartan-HCTZ 80-12.5 mg PO QD and reports excellent compliance ?Recommend he continue these measures ?Discussed using compression stockings to assist with LE edema, if not controlled with compression and increased physical activity, may need to increase HCTZ component of BP medication ?Recommended at least 150 minutes of moderate intensity physical activity per week to assist with weight loss, HTN and prediabetes. ?Follow up in 3 months for BP recommended  ?

## 2021-10-09 NOTE — Progress Notes (Signed)
? ? ? ?     Established Patient Office Visit ? ?Name: [name]   MRN: [id number]    DOB: 1991/02/17   Date:10/09/2021 ? 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  ? ?      ?Subjective ? ?Chief Complaint ? ?Chief Complaint  ?Patient presents with  ? Follow-up  ?  Blood Pressure  ? ? ?HPI ? ? ?Hypertension: ?- Medications: Valsartan-HCTZ ?- Compliance: excellent, taking every day  ?- Checking BP at home: not taking at home  ?- Denies any SOB, CP, vision changes, medication SEs, or symptoms of hypotension ?- Diet: Reports he is eating 3 times per day, seems like he is trying to reduce carb intake, mostly comprised of protein and vegetables ?- Exercise: "Not as much as I should" ? ?Reports LE edema at the end of the day somedays from extended time standing  ? ? ?Patient Active Problem List  ? Diagnosis Date Noted  ? Pre-diabetes 04/12/2021  ? Primary hypertension 04/12/2021  ? COVID-19 virus infection 02/07/2019  ? Asthma, mild intermittent   ? Morbid obesity (HCC) 02/08/2008  ? ? ?No past surgical history on file. ? ?Family History  ?Problem Relation Age of Onset  ? Obesity Mother   ? Kidney disease Father   ?     mass  ? Liver cancer Father   ? Obesity Sister   ? ? ?Social History  ? ?Tobacco Use  ? Smoking status: Never  ? Smokeless tobacco: Never  ?Substance Use Topics  ? Alcohol use: Yes  ?  Alcohol/week: 1.0 standard drink  ?  Types: 1 Cans of beer per week  ? ? ? ?Current Outpatient Medications:  ?  albuterol (VENTOLIN HFA) 108 (90 Base) MCG/ACT inhaler, Inhale 2 puffs into the lungs every 4 (four) hours as needed for wheezing or shortness of breath., Disp: 18 g, Rfl: 0 ?  azelastine (ASTELIN) 0.1 % nasal spray, Place 2 sprays into both nostrils 2 (two) times daily. Use in each nostril as directed, Disp: 30 mL, Rfl: 2 ?  cefdinir (OMNICEF) 300 MG capsule, Take 300 mg by mouth 2 (two) times daily., Disp: , Rfl:  ?   fluticasone (FLONASE) 50 MCG/ACT nasal spray, Place 2 sprays into both nostrils daily., Disp: 16 g, Rfl: 2 ?  fluticasone furoate-vilanterol (BREO ELLIPTA) 100-25 MCG/ACT AEPB, Inhale 1 puff into the lungs daily., Disp: 1 each, Rfl: 2 ?  metFORMIN (GLUCOPHAGE XR) 750 MG 24 hr tablet, Take 1 tablet (750 mg total) by mouth daily with breakfast., Disp: 90 tablet, Rfl: 0 ?  Semaglutide (RYBELSUS) 7 MG TABS, Take 7 mg by mouth daily., Disp: 90 tablet, Rfl: 0 ?  valsartan-hydrochlorothiazide (DIOVAN HCT) 80-12.5 MG tablet, Take 1 tablet by mouth daily., Disp: 90 tablet, Rfl: 0 ? ?No Known Allergies ? ? ? ? ?Review of Systems  ?HENT:  Negative for tinnitus.   ?Eyes:  Negative for blurred vision and double vision.  ?Respiratory:  Negative for shortness of breath and wheezing.   ?Cardiovascular:  Positive for leg swelling. Negative for chest pain and palpitations.  ?Musculoskeletal:  Negative for back pain.  ?Neurological:  Negative for dizziness and headaches.  ? ? ? ?Objective ? ?Vitals:  ? 10/09/21 0819  ?BP: 130/78  ?Pulse: 81  ?Resp: 16  ?SpO2: 97%  ?Weight: (!) 479 lb (217.3 kg)  ?Height: 6\' 4"  (1.93 m)  ? ? ?Body mass  index is 58.31 kg/m?. ? ?Physical Exam ?Constitutional:   ?   General: He is awake.  ?   Appearance: Normal appearance. He is well-developed and well-groomed. He is morbidly obese.  ?HENT:  ?   Head: Normocephalic and atraumatic.  ?Cardiovascular:  ?   Pulses: Normal pulses.     ?     Radial pulses are 2+ on the right side and 2+ on the left side.  ?   Heart sounds: Heart sounds are distant.  ?   Comments: Distant heart sounds due to body habitus  ? ?Pulmonary:  ?   Effort: Pulmonary effort is normal.  ?   Breath sounds: Normal breath sounds. No decreased breath sounds, wheezing, rhonchi or rales.  ?Musculoskeletal:  ?   Right lower leg: 1+ Edema present.  ?   Left lower leg: Edema present.  ?Neurological:  ?   Mental Status: He is alert.  ?Psychiatric:     ?   Behavior: Behavior is cooperative.   ? ? ? ?No results found for this or any previous visit (from the past 2160 hour(s)). ? ? ?PHQ2/9: ? ?  10/09/2021  ?  8:19 AM 09/04/2021  ? 10:01 AM 08/30/2021  ?  7:34 AM 08/01/2021  ?  8:17 AM 04/12/2021  ?  8:04 AM  ?Depression screen PHQ 2/9  ?Decreased Interest 0 0 0 0 0  ?Down, Depressed, Hopeless 0 0 0 0 0  ?PHQ - 2 Score 0 0 0 0 0  ?Altered sleeping  0 0 0 0  ?Tired, decreased energy  3 0 0 0  ?Change in appetite  0 0 0 0  ?Feeling bad or failure about yourself   0 0 0 0  ?Trouble concentrating  0 0 0 0  ?Moving slowly or fidgety/restless  0 0 0 0  ?Suicidal thoughts  0 0 0 0  ?PHQ-9 Score  3 0 0 0  ?Difficult doing work/chores  Somewhat difficult  Not difficult at all   ?  ? ? ?Fall Risk: ? ?  10/09/2021  ?  8:19 AM 09/04/2021  ? 10:00 AM 08/30/2021  ?  7:34 AM 08/01/2021  ?  8:16 AM 04/12/2021  ?  8:04 AM  ?Fall Risk   ?Falls in the past year? 0 0 0 0 0  ?Number falls in past yr: 0  0 0 0  ?Injury with Fall? 0  0 0 0  ?Risk for fall due to : No Fall Risks  No Fall Risks No Fall Risks No Fall Risks  ?Follow up Falls prevention discussed Falls prevention discussed Falls prevention discussed Falls prevention discussed Falls prevention discussed  ? ? ? ? ?Functional Status Survey: ?Is the patient deaf or have difficulty hearing?: No ?Does the patient have difficulty seeing, even when wearing glasses/contacts?: No ?Does the patient have difficulty concentrating, remembering, or making decisions?: No ?Does the patient have difficulty walking or climbing stairs?: No ?Does the patient have difficulty dressing or bathing?: No ?Does the patient have difficulty doing errands alone such as visiting a doctor's office or shopping?: No ? ? ? ?Assessment & Plan ? ?Problem List Items Addressed This Visit   ? ?  ? Cardiovascular and Mediastinum  ? Primary hypertension - Primary  ?  Chronic, appears to be controlled today in office ?Currently taking Valsartan-HCTZ 80-12.5 mg PO QD and reports excellent compliance ?Recommend he continue  these measures ?Discussed using compression stockings to assist with LE edema, if not controlled with compression  and increased physical activity, may need to increase HCTZ component of BP medication ?Recommended at least 150 minutes of moderate intensity physical activity per week to assist with weight loss, HTN and prediabetes. ?Follow up in 3 months for BP recommended  ? ?  ?  ? ? ? ?No follow-ups on file. ? ? ?I, Antonieta Slaven E Kenyetta Fife, PA-C, have reviewed all documentation for this visit. The documentation on 10/09/21 for the exam, diagnosis, procedures, and orders are all accurate and complete. ? ? ?Boyce Keltner, MHS, PA-C ?[facility] ?[facility]  ? ?

## 2021-10-14 ENCOUNTER — Encounter: Payer: Self-pay | Admitting: Family Medicine

## 2021-10-14 ENCOUNTER — Other Ambulatory Visit: Payer: Self-pay

## 2021-10-14 DIAGNOSIS — K6289 Other specified diseases of anus and rectum: Secondary | ICD-10-CM

## 2021-11-05 ENCOUNTER — Ambulatory Visit: Payer: Federal, State, Local not specified - PPO | Admitting: Family Medicine

## 2021-11-05 ENCOUNTER — Encounter: Payer: Self-pay | Admitting: Family Medicine

## 2021-11-05 VITALS — BP 132/82 | HR 102 | Temp 97.5°F | Resp 16 | Ht 76.0 in | Wt >= 6400 oz

## 2021-11-05 DIAGNOSIS — R1114 Bilious vomiting: Secondary | ICD-10-CM

## 2021-11-05 DIAGNOSIS — R000 Tachycardia, unspecified: Secondary | ICD-10-CM

## 2021-11-05 DIAGNOSIS — R6883 Chills (without fever): Secondary | ICD-10-CM

## 2021-11-05 MED ORDER — ONDANSETRON 4 MG PO TBDP: 4.0000 mg | ORAL_TABLET | Freq: Three times a day (TID) | ORAL | 0 refills | Status: DC | PRN

## 2021-11-05 NOTE — Progress Notes (Signed)
Name: [name]   MRN: [id number]    DOB: 1991-05-22   Date:11/05/2021       Progress Note  Subjective  Chief Complaint  Vomiting  HPI  Nausea/Vomiting: symptoms started suddenly two nights ago, woke up multiple times during the night . Initially food after that just bile. He felt nauseated yesterday but able to eat some crackers and drink ginger ale, but last night had pot roast again ( cooked at home and the same thing he had Sunday pm) and vomited again during the night. Only abdominal pain is when he vomits, no change in bowel movements. He had chills this morning. He lives with his mother and she is not sick and ate the same meal.   No fever, no rashes, headache or sore throat. He feels well except for the nausea  He has been on Rybelsus for months now   Patient Active Problem List   Diagnosis Date Noted   Pre-diabetes 04/12/2021   Primary hypertension 04/12/2021   COVID-19 virus infection 02/07/2019   Asthma, mild intermittent    Morbid obesity (HCC) 02/08/2008    No past surgical history on file.  Family History  Problem Relation Age of Onset   Obesity Mother    Kidney disease Father        mass   Liver cancer Father    Obesity Sister     Social History   Tobacco Use   Smoking status: Never   Smokeless tobacco: Never  Substance Use Topics   Alcohol use: Yes    Alcohol/week: 1.0 standard drink of alcohol    Types: 1 Cans of beer per week     Current Outpatient Medications:    albuterol (VENTOLIN HFA) 108 (90 Base) MCG/ACT inhaler, Inhale 2 puffs into the lungs every 4 (four) hours as needed for wheezing or shortness of breath., Disp: 18 g, Rfl: 0   azelastine (ASTELIN) 0.1 % nasal spray, Place 2 sprays into both nostrils 2 (two) times daily. Use in each nostril as directed, Disp: 30 mL, Rfl: 2   cefdinir (OMNICEF) 300 MG capsule, Take 300 mg by mouth 2 (two) times daily., Disp: , Rfl:    fluticasone (FLONASE) 50 MCG/ACT nasal spray, Place 2 sprays into  both nostrils daily., Disp: 16 g, Rfl: 2   fluticasone furoate-vilanterol (BREO ELLIPTA) 100-25 MCG/ACT AEPB, Inhale 1 puff into the lungs daily., Disp: 1 each, Rfl: 2   metFORMIN (GLUCOPHAGE XR) 750 MG 24 hr tablet, Take 1 tablet (750 mg total) by mouth daily with breakfast., Disp: 90 tablet, Rfl: 0   Semaglutide (RYBELSUS) 7 MG TABS, Take 7 mg by mouth daily., Disp: 90 tablet, Rfl: 0   valsartan-hydrochlorothiazide (DIOVAN HCT) 80-12.5 MG tablet, Take 1 tablet by mouth daily., Disp: 90 tablet, Rfl: 0  No Known Allergies  I personally reviewed active problem list, medication list, allergies, family history, social history, health maintenance with the patient/caregiver today.   ROS  Ten systems reviewed and is negative except as mentioned in HPI   Objective  Vitals:   11/05/21 0956  BP: 132/82  Pulse: (!) 102  Resp: 16  Temp: (!) 97.5 F (36.4 C)  SpO2: 98%  Weight: (!) 470 lb (213.2 kg)  Height: 6\' 4"  (1.93 m)    Body mass index is 57.21 kg/m.  Physical Exam  Constitutional: Patient appears well-developed and well-nourished. Obese  No distress.  HEENT: head atraumatic, normocephalic, pupils equal and reactive to light, neck supple Cardiovascular: Normal rate, regular rhythm  and normal heart sounds.  No murmur heard. No BLE edema. Pulmonary/Chest: Effort normal and breath sounds normal. No respiratory distress. Abdominal: Soft.  There is mild  tenderness on mid abdomen, normal bowel sounds and no guarding or rebound tenderness . Marland Kitchen Psychiatric: Patient has a normal mood and affect. behavior is normal. Judgment and thought content normal.   PHQ2/9:    11/05/2021    9:56 AM 10/09/2021    8:19 AM 09/04/2021   10:01 AM 08/30/2021    7:34 AM 08/01/2021    8:17 AM  Depression screen PHQ 2/9  Decreased Interest 0 0 0 0 0  Down, Depressed, Hopeless 0 0 0 0 0  PHQ - 2 Score 0 0 0 0 0  Altered sleeping   0 0 0  Tired, decreased energy   3 0 0  Change in appetite   0 0 0  Feeling  bad or failure about yourself    0 0 0  Trouble concentrating   0 0 0  Moving slowly or fidgety/restless   0 0 0  Suicidal thoughts   0 0 0  PHQ-9 Score   3 0 0  Difficult doing work/chores   Somewhat difficult  Not difficult at all    phq 9 is negative   Fall Risk:    11/05/2021    9:55 AM 10/09/2021    8:19 AM 09/04/2021   10:00 AM 08/30/2021    7:34 AM 08/01/2021    8:16 AM  Fall Risk   Falls in the past year? 0 0 0 0 0  Number falls in past yr: 0 0  0 0  Injury with Fall? 0 0  0 0  Risk for fall due to : No Fall Risks No Fall Risks  No Fall Risks No Fall Risks  Follow up Falls prevention discussed Falls prevention discussed Falls prevention discussed Falls prevention discussed Falls prevention discussed      Functional Status Survey: Is the patient deaf or have difficulty hearing?: No Does the patient have difficulty seeing, even when wearing glasses/contacts?: No Does the patient have difficulty concentrating, remembering, or making decisions?: No Does the patient have difficulty walking or climbing stairs?: No Does the patient have difficulty dressing or bathing?: No Does the patient have difficulty doing errands alone such as visiting a doctor's office or shopping?: No    Assessment & Plan  1. Bilious vomiting with nausea  - COMPLETE METABOLIC PANEL WITH GFR - CBC with Differential/Platelet - ondansetron (ZOFRAN-ODT) 4 MG disintegrating tablet; Take 1 tablet (4 mg total) by mouth every 8 (eight) hours as needed for nausea or vomiting.  Dispense: 12  tablet; Refill: 0  Discussed possibly viral gastritis, eat a bland diet, stay hydrated, he may develop diarrhea. Excuse for work from Monday till Wednesday but if needed we can extend time off   2. Chills  - COMPLETE METABOLIC PANEL WITH GFR - CBC with Differential/Platelet  3. Tachycardia  - COMPLETE METABOLIC PANEL WITH GFR - CBC with Differential/Platelet   Can try medication first and if no improvement return  for labs

## 2021-11-06 LAB — CBC WITH DIFFERENTIAL/PLATELET
Absolute Monocytes: 464 cells/uL (ref 200–950)
Basophils Absolute: 43 cells/uL (ref 0–200)
Basophils Relative: 0.5 %
Eosinophils Absolute: 224 cells/uL (ref 15–500)
Eosinophils Relative: 2.6 %
HCT: 37.4 % — ABNORMAL LOW (ref 38.5–50.0)
Hemoglobin: 11.9 g/dL — ABNORMAL LOW (ref 13.2–17.1)
Lymphs Abs: 2150 cells/uL (ref 850–3900)
MCH: 23 pg — ABNORMAL LOW (ref 27.0–33.0)
MCHC: 31.8 g/dL — ABNORMAL LOW (ref 32.0–36.0)
MCV: 72.3 fL — ABNORMAL LOW (ref 80.0–100.0)
MPV: 11.5 fL (ref 7.5–12.5)
Monocytes Relative: 5.4 %
Neutro Abs: 5719 cells/uL (ref 1500–7800)
Neutrophils Relative %: 66.5 %
Platelets: 291 10*3/uL (ref 140–400)
RBC: 5.17 10*6/uL (ref 4.20–5.80)
RDW: 15.7 % — ABNORMAL HIGH (ref 11.0–15.0)
Total Lymphocyte: 25 %
WBC: 8.6 10*3/uL (ref 3.8–10.8)

## 2021-11-06 LAB — COMPLETE METABOLIC PANEL WITH GFR
AG Ratio: 1.1 (calc) (ref 1.0–2.5)
ALT: 13 U/L (ref 9–46)
AST: 12 U/L (ref 10–40)
Albumin: 4.1 g/dL (ref 3.6–5.1)
Alkaline phosphatase (APISO): 71 U/L (ref 36–130)
BUN: 17 mg/dL (ref 7–25)
CO2: 27 mmol/L (ref 20–32)
Calcium: 9.8 mg/dL (ref 8.6–10.3)
Chloride: 104 mmol/L (ref 98–110)
Creat: 1 mg/dL (ref 0.60–1.24)
Globulin: 3.6 g/dL (calc) (ref 1.9–3.7)
Glucose, Bld: 92 mg/dL (ref 65–99)
Potassium: 4.6 mmol/L (ref 3.5–5.3)
Sodium: 140 mmol/L (ref 135–146)
Total Bilirubin: 0.5 mg/dL (ref 0.2–1.2)
Total Protein: 7.7 g/dL (ref 6.1–8.1)
eGFR: 104 mL/min/{1.73_m2} (ref >=60)

## 2021-11-07 ENCOUNTER — Encounter: Payer: Self-pay | Admitting: Family Medicine

## 2021-11-07 ENCOUNTER — Ambulatory Visit: Payer: Federal, State, Local not specified - PPO | Admitting: Family Medicine

## 2021-11-07 VITALS — BP 122/80 | HR 98 | Temp 97.5°F | Resp 16 | Ht 76.0 in | Wt >= 6400 oz

## 2021-11-07 DIAGNOSIS — R1114 Bilious vomiting: Secondary | ICD-10-CM

## 2021-11-07 DIAGNOSIS — R112 Nausea with vomiting, unspecified: Secondary | ICD-10-CM

## 2021-11-07 MED ORDER — FAMOTIDINE 20 MG PO TABS: 20.0000 mg | ORAL_TABLET | Freq: Two times a day (BID) | ORAL | 0 refills | Status: DC

## 2021-11-07 MED ORDER — ONDANSETRON 4 MG PO TBDP
4.0000 mg | ORAL_TABLET | Freq: Three times a day (TID) | ORAL | 0 refills | Status: DC | PRN
Start: 2021-11-07 — End: 2021-11-14

## 2021-11-07 NOTE — Progress Notes (Signed)
Patient ID: [name], male    DOB: Jun 29, 1990, 29 y.o.   MRN: [id number]  PCP: Steele Sizer, MD  Chief Complaint  Patient presents with   Follow-up   Nausea    Still feeling nauseous, medication given last week did not help much.   Emesis    This morning, stomach feels sore of gagging when vomiting    Subjective:   [name] is a 31 y.o. male, presents to clinic with CC of the following:  Emesis  This is a new problem. The current episode started in the past 7 days (sunday  4 days). Episode frequency: nausea is constant, no vomiting yesterday, once today. The problem has been unchanged. The emesis has an appearance of stomach contents. There has been no fever. Associated symptoms include sweats. Pertinent negatives include no abdominal pain, arthralgias, chest pain, chills, coughing, diarrhea, dizziness, fever, headaches, myalgias, URI or weight loss. Risk factors: thought it was something he ate.      Patient Active Problem List   Diagnosis Date Noted   Pre-diabetes 04/12/2021   Primary hypertension 04/12/2021   COVID-19 virus infection 02/07/2019   Asthma, mild intermittent    Morbid obesity ([place]) 02/08/2008      Current Outpatient Medications:    albuterol (VENTOLIN HFA) 108 (90 Base) MCG/ACT inhaler, Inhale 2 puffs into the lungs every 4 (four) hours as needed for wheezing or shortness of breath., Disp: 18 g, Rfl: 0   azelastine (ASTELIN) 0.1 % nasal spray, Place 2 sprays into both nostrils 2 (two) times daily. Use in each nostril as directed, Disp: 30 mL, Rfl: 2   cefdinir (OMNICEF) 300 MG capsule, Take 300 mg by mouth 2 (two) times daily., Disp: , Rfl:    fluticasone (FLONASE) 50 MCG/ACT nasal spray, Place 2 sprays into both nostrils daily., Disp: 16 g, Rfl: 2   fluticasone furoate-vilanterol (BREO ELLIPTA) 100-25 MCG/ACT AEPB, Inhale 1 puff into the lungs daily., Disp: 1 each, Rfl: 2   metFORMIN (GLUCOPHAGE XR) 750 MG 24 hr tablet, Take 1 tablet (750 mg  total) by mouth daily with breakfast., Disp: 90 tablet, Rfl: 0   ondansetron (ZOFRAN-ODT) 4 MG disintegrating tablet, Take 1 tablet (4 mg total) by mouth every 8 (eight) hours as needed for nausea or vomiting., Disp: 12 tablet, Rfl: 0   Semaglutide (RYBELSUS) 7 MG TABS, Take 7 mg by mouth daily., Disp: 90 tablet, Rfl: 0   valsartan-hydrochlorothiazide (DIOVAN HCT) 80-12.5 MG tablet, Take 1 tablet by mouth daily., Disp: 90 tablet, Rfl: 0   No Known Allergies   Social History   Tobacco Use   Smoking status: Never   Smokeless tobacco: Never  Vaping Use   Vaping Use: Never used  Substance Use Topics   Alcohol use: Yes    Alcohol/week: 1.0 standard drink of alcohol    Types: 1 Cans of beer per week   Drug use: Not Currently      Chart Review Today: I personally reviewed active problem list, medication list, allergies, family history, social history, health maintenance, notes from last encounter, lab results, imaging with the patient/caregiver today.   Review of Systems  Constitutional: Negative.  Negative for chills, fever and weight loss.  HENT: Negative.    Eyes: Negative.   Respiratory: Negative.  Negative for cough.   Cardiovascular: Negative.  Negative for chest pain.  Gastrointestinal:  Positive for vomiting. Negative for abdominal pain and diarrhea.  Endocrine: Negative.   Genitourinary: Negative.   Musculoskeletal: Negative.  Negative for arthralgias and myalgias.  Skin: Negative.   Allergic/Immunologic: Negative.   Neurological: Negative.  Negative for dizziness and headaches.  Hematological: Negative.   Psychiatric/Behavioral: Negative.    All other systems reviewed and are negative.      Objective:   Vitals:   11/07/21 1415  BP: 122/80  Pulse: 98  Resp: 16  Temp: (!) 97.5 F (36.4 C)  TempSrc: Oral  SpO2: 97%  Weight: (!) 467 lb 3.2 oz (211.9 kg)  Height: 6' 4"  (1.93 m)    Body mass index is 56.87 kg/m.  Physical Exam Vitals and nursing note  reviewed.  Constitutional:      General: He is not in acute distress.    Appearance: Normal appearance. He is well-developed and well-groomed. He is morbidly obese. He is not ill-appearing, toxic-appearing or diaphoretic.  HENT:     Head: Normocephalic and atraumatic.     Right Ear: External ear normal.     Left Ear: External ear normal.     Mouth/Throat:     Mouth: Mucous membranes are moist.     Pharynx: Oropharynx is clear. No oropharyngeal exudate.  Eyes:     General: No scleral icterus.       Right eye: No discharge.        Left eye: No discharge.     Conjunctiva/sclera: Conjunctivae normal.  Neck:     Trachea: No tracheal deviation.  Cardiovascular:     Rate and Rhythm: Normal rate and regular rhythm.     Pulses: Normal pulses.     Heart sounds: Normal heart sounds.  Pulmonary:     Effort: Pulmonary effort is normal. No respiratory distress.     Breath sounds: Normal breath sounds. No stridor.  Abdominal:     General: Bowel sounds are normal. There is no distension.     Palpations: Abdomen is soft.     Tenderness: There is no abdominal tenderness. There is no guarding or rebound.  Skin:    General: Skin is warm and dry.     Coloration: Skin is not jaundiced.     Findings: No rash.  Neurological:     Mental Status: He is alert.     Motor: No abnormal muscle tone.     Coordination: Coordination normal.  Psychiatric:        Mood and Affect: Mood normal.        Behavior: Behavior normal. Behavior is cooperative.      Results for orders placed or performed in visit on 11/05/21  COMPLETE METABOLIC PANEL WITH GFR  Result Value Ref Range   Glucose, Bld 92 65 - 99 mg/dL   BUN 17 7 - 25 mg/dL   Creat 1.00 0.60 - 1.24 mg/dL   eGFR 104 > OR = 60 mL/min/1.29m   BUN/Creatinine Ratio NOT APPLICABLE 6 - 22 (calc)   Sodium 140 135 - 146 mmol/L   Potassium 4.6 3.5 - 5.3 mmol/L   Chloride 104 98 - 110 mmol/L   CO2 27 20 - 32 mmol/L   Calcium 9.8 8.6 - 10.3 mg/dL   Total  Protein 7.7 6.1 - 8.1 g/dL   Albumin 4.1 3.6 - 5.1 g/dL   Globulin 3.6 1.9 - 3.7 g/dL (calc)   AG Ratio 1.1 1.0 - 2.5 (calc)   Total Bilirubin 0.5 0.2 - 1.2 mg/dL   Alkaline phosphatase (APISO) 71 36 - 130 U/L   AST 12 10 - 40 U/L   ALT 13 9 - 46 U/L  CBC with Differential/Platelet  Result Value Ref Range   WBC 8.6 3.8 - 10.8 Thousand/uL   RBC 5.17 4.20 - 5.80 Million/uL   Hemoglobin 11.9 (L) 13.2 - 17.1 g/dL   HCT 37.4 (L) 38.5 - 50.0 %   MCV 72.3 (L) 80.0 - 100.0 fL   MCH 23.0 (L) 27.0 - 33.0 pg   MCHC 31.8 (L) 32.0 - 36.0 g/dL   RDW 15.7 (H) 11.0 - 15.0 %   Platelets 291 140 - 400 Thousand/uL   MPV 11.5 7.5 - 12.5 fL   Neutro Abs 5,719 1,500 - 7,800 cells/uL   Lymphs Abs 2,150 850 - 3,900 cells/uL   Absolute Monocytes 464 200 - 950 cells/uL   Eosinophils Absolute 224 15 - 500 cells/uL   Basophils Absolute 43 0 - 200 cells/uL   Neutrophils Relative % 66.5 %   Total Lymphocyte 25.0 %   Monocytes Relative 5.4 %   Eosinophils Relative 2.6 %   Basophils Relative 0.5 %       Assessment & Plan:    1. Nausea and vomiting, unspecified vomiting type Pt with 2-3 d of N/V, seems to be generally improving, no diarrhea, no sick contacts, no fever, some indigestion and upper stomach upset Since one episode of vomiting this am he has tolerated POs He is well appearing, VSS, looks well hydrated, abd exam benign Continue antiemetics, add pepcid, slowly progress diet Suspicious for viral etiology -zofran - famotidine (PEPCID) 20 MG tablet; Take 1 tablet (20 mg total) by mouth 2 (two) times daily.  Dispense: 60 tablet; Refill: 0      Delsa Grana, PA-C 11/07/21 2:23 PM

## 2021-11-12 ENCOUNTER — Telehealth: Payer: Self-pay | Admitting: Family Medicine

## 2021-11-12 ENCOUNTER — Ambulatory Visit: Payer: Self-pay

## 2021-11-12 ENCOUNTER — Other Ambulatory Visit: Payer: Self-pay | Admitting: Family Medicine

## 2021-11-12 DIAGNOSIS — R112 Nausea with vomiting, unspecified: Secondary | ICD-10-CM

## 2021-11-12 DIAGNOSIS — R1013 Epigastric pain: Secondary | ICD-10-CM | POA: Diagnosis not present

## 2021-11-12 NOTE — Telephone Encounter (Signed)
Pt stated has been experiencing nausea and vomiting again this morning. Pt stated was recently seen for this; however, it hasn't gotten  any better.    Pt seeking clinical advice.     Chief Complaint: Nausea, vomiting. Seen in office last week. Had gotten a little better, but still having issues. Has vomited x 4 today. Symptoms: Above Frequency: Last week Pertinent Negatives: Patient denies diarrhea or fever Disposition: [] ED /[] Urgent Care (no appt availability in office) / [] Appointment(In office/virtual)/ []  [facility]/ [] Home Care/ [] Refused Recommended Disposition /[] [facility]/ [x]  Follow-up with PCP Additional Notes: Asking for further advice.  Answer Assessment - Initial Assessment Questions 1. VOMITING SEVERITY: "How many times have you vomited in the past 24 hours?"     - MILD:  1 - 2 times/day    - MODERATE: 3 - 5 times/day, decreased oral intake without significant weight loss or symptoms of dehydration    - SEVERE: 6 or more times/day, vomits everything or nearly everything, with significant weight loss, symptoms of dehydration      4 2. ONSET: "When did the vomiting begin?"      Last week 3. FLUIDS: "What fluids or food have you vomited up today?" "Have you been able to keep any fluids down?"     Yes 4. ABDOMINAL PAIN: "Are your having any abdominal pain?" If yes : "How bad is it and what does it feel like?" (e.g., crampy, dull, intermittent, constant)      Mild 5. DIARRHEA: "Is there any diarrhea?" If Yes, ask: "How many times today?"      No 6. CONTACTS: "Is there anyone else in the family with the same symptoms?"      No 7. CAUSE: "What do you think is causing your vomiting?"     Unsure 8. HYDRATION STATUS: "Any signs of dehydration?" (e.g., dry mouth [not only dry lips], too weak to stand) "When did you last urinate?"     No 9. OTHER SYMPTOMS: "Do you have any other symptoms?" (e.g., fever, headache, vertigo, vomiting blood or coffee  grounds, recent head injury)     No 10. PREGNANCY: "Is there any chance you are pregnant?" "When was your last menstrual period?"       N/a  Protocols used: Vomiting-A-AH

## 2021-11-12 NOTE — Telephone Encounter (Signed)
Pt aware to come by for test and about rx. Pt stated he stopped Rybelsus a week ago, but will continue to not take it until tests are back to see if he needs GI.

## 2021-11-12 NOTE — Telephone Encounter (Unsigned)
Copied from CRM [id number]. Topic: General - Other >> Nov 12, 2021  1:06 PM Lynford Citizen wrote: Reason for CRM: Pt called requesting a doctors note clearing him to go back to work tomorrow 6/21, please advise.

## 2021-11-13 ENCOUNTER — Telehealth: Payer: Self-pay | Admitting: Family Medicine

## 2021-11-13 NOTE — Telephone Encounter (Signed)
It is in there viewable to patient

## 2021-11-13 NOTE — Telephone Encounter (Signed)
Spoke with pt and and informed him that it should be in [place]. He checked while we where on the phone and said that last note in his chart was from May. Is it possible for you to release the note for him?

## 2021-11-13 NOTE — Telephone Encounter (Signed)
Copied from CRM [id number]. Topic: General - Inquiry >> Nov 13, 2021  8:54 AM Pincus Sanes wrote: Reason for CRM: Pt had called in this am wanting work note, notified ready for pu but now wants to be uploaded to MyChart.

## 2021-11-13 NOTE — Telephone Encounter (Signed)
Left voicemail letting pt know letter is ready and up front for pick up

## 2021-11-13 NOTE — Telephone Encounter (Signed)
Pt called back and stated he did not receive the note and was supposed to be at work at 6 a.m. this morning.  Pt is asking if a note can be written for him to go back in tomorrow, 11/14/21, and stated he can return to work with no restrictions.     Please advise.

## 2021-11-14 ENCOUNTER — Other Ambulatory Visit: Payer: Self-pay | Admitting: Family Medicine

## 2021-11-14 ENCOUNTER — Encounter: Payer: Self-pay | Admitting: Family Medicine

## 2021-11-14 DIAGNOSIS — R1114 Bilious vomiting: Secondary | ICD-10-CM

## 2021-11-14 DIAGNOSIS — R1013 Epigastric pain: Secondary | ICD-10-CM

## 2021-11-14 LAB — H. PYLORI BREATH TEST: H. pylori Breath Test: NOT DETECTED

## 2021-11-14 LAB — LIPASE: Lipase: 8 U/L (ref 7–60)

## 2021-11-14 MED ORDER — OMEPRAZOLE 40 MG PO CPDR: 40.0000 mg | DELAYED_RELEASE_CAPSULE | Freq: Every morning | ORAL | 0 refills | Status: DC

## 2021-11-14 MED ORDER — ONDANSETRON 8 MG PO TBDP: 8.0000 mg | ORAL_TABLET | Freq: Three times a day (TID) | ORAL | 0 refills | Status: DC | PRN

## 2021-11-18 ENCOUNTER — Other Ambulatory Visit: Payer: Self-pay

## 2021-11-18 ENCOUNTER — Emergency Department (HOSPITAL_BASED_OUTPATIENT_CLINIC_OR_DEPARTMENT_OTHER)
Admission: EM | Admit: 2021-11-18 | Discharge: 2021-11-18 | Disposition: A | Discharge status: Home / Self Care | Payer: Federal, State, Local not specified - PPO | Attending: Emergency Medicine | Admitting: Emergency Medicine

## 2021-11-18 ENCOUNTER — Encounter (HOSPITAL_BASED_OUTPATIENT_CLINIC_OR_DEPARTMENT_OTHER): Payer: Self-pay | Admitting: Emergency Medicine

## 2021-11-18 DIAGNOSIS — R112 Nausea with vomiting, unspecified: Secondary | ICD-10-CM | POA: Diagnosis not present

## 2021-11-18 DIAGNOSIS — R110 Nausea: Secondary | ICD-10-CM | POA: Diagnosis not present

## 2021-11-18 DIAGNOSIS — R1111 Vomiting without nausea: Secondary | ICD-10-CM | POA: Diagnosis not present

## 2021-11-18 LAB — COMPREHENSIVE METABOLIC PANEL
ALT: 10 U/L (ref 0–44)
AST: 11 U/L — ABNORMAL LOW (ref 15–41)
Albumin: 4.3 g/dL (ref 3.5–5.0)
Alkaline Phosphatase: 63 U/L (ref 38–126)
Anion gap: 10 (ref 5–15)
BUN: 14 mg/dL (ref 6–20)
CO2: 25 mmol/L (ref 22–32)
Calcium: 10.4 mg/dL — ABNORMAL HIGH (ref 8.9–10.3)
Chloride: 103 mmol/L (ref 98–111)
Creatinine, Ser: 0.87 mg/dL (ref 0.61–1.24)
GFR, Estimated: >60 mL/min (ref >60)
Glucose, Bld: 94 mg/dL (ref 70–99)
Potassium: 3.9 mmol/L (ref 3.5–5.1)
Sodium: 138 mmol/L (ref 135–145)
Total Bilirubin: 0.8 mg/dL (ref 0.3–1.2)
Total Protein: 8 g/dL (ref 6.5–8.1)

## 2021-11-18 LAB — CBC
HCT: 36.8 % — ABNORMAL LOW (ref 39.0–52.0)
Hemoglobin: 11.5 g/dL — ABNORMAL LOW (ref 13.0–17.0)
MCH: 22.5 pg — ABNORMAL LOW (ref 26.0–34.0)
MCHC: 31.3 g/dL (ref 30.0–36.0)
MCV: 72.2 fL — ABNORMAL LOW (ref 80.0–100.0)
Platelets: 282 10*3/uL (ref 150–400)
RBC: 5.1 MIL/uL (ref 4.22–5.81)
RDW: 15 % (ref 11.5–15.5)
WBC: 9.7 10*3/uL (ref 4.0–10.5)
nRBC: 0 % (ref 0.0–0.2)

## 2021-11-18 LAB — LIPASE, BLOOD: Lipase: <10 U/L — ABNORMAL LOW (ref 11–51)

## 2021-11-18 MED ORDER — METOCLOPRAMIDE HCL 10 MG PO TABS: 10.0000 mg | ORAL_TABLET | Freq: Four times a day (QID) | ORAL | 0 refills | Status: DC

## 2021-11-18 MED ORDER — METOCLOPRAMIDE HCL 5 MG/ML IJ SOLN
10.0000 mg | Freq: Once | INTRAMUSCULAR | Status: AC
  Administered 2021-11-18: 10 mg via INTRAVENOUS
  Filled 2021-11-18: qty 2

## 2021-11-18 MED ORDER — LACTATED RINGERS IV BOLUS
1000.0000 mL | Freq: Once | INTRAVENOUS | Status: AC
  Administered 2021-11-18: 1000 mL via INTRAVENOUS

## 2021-11-18 MED ORDER — DIPHENHYDRAMINE HCL 50 MG/ML IJ SOLN
25.0000 mg | Freq: Once | INTRAMUSCULAR | Status: AC
  Administered 2021-11-18: 25 mg via INTRAVENOUS
  Filled 2021-11-18: qty 1

## 2021-11-18 NOTE — ED Provider Notes (Signed)
[facility] EMERGENCY DEPT Provider Note   CSN: [id number] Arrival date & time: 11/18/21  1120     History  Chief Complaint  Patient presents with   Nausea   Emesis    [name] is a 31 y.o. male who presents to the emergency department for evaluation of nausea and vomiting that has been ongoing for 2 weeks.  Patient states he was up in the middle the night with the need to vomit and also vomits nearly every morning.  Throughout the day he is having constant nausea.  He has been seen by his PCP and prescribed Zofran and Pepcid without any relief of symptoms.  He does have an appointment with GI this coming Thursday, 4 days from now, however patient is unable to tolerate the symptoms of nausea and vomiting until he can be seen.  He denies abdominal pain, cough, congestion, fevers, diarrhea, chest pain.  He has no previous history of similar complaints.  He states that he does not use cannabis.  Emesis      Home Medications Prior to Admission medications   Medication Sig Start Date End Date Taking? Authorizing Provider  metoCLOPramide (REGLAN) 10 MG tablet Take 1 tablet (10 mg total) by mouth every 6 (six) hours. 11/18/21  Yes Raynald Blend R, PA-C  albuterol (VENTOLIN HFA) 108 (90 Base) MCG/ACT inhaler Inhale 2 puffs into the lungs every 4 (four) hours as needed for wheezing or shortness of breath. 08/30/21   Alba Cory, MD  azelastine (ASTELIN) 0.1 % nasal spray Place 2 sprays into both nostrils 2 (two) times daily. Use in each nostril as directed 09/20/18   Alba Cory, MD  cefdinir (OMNICEF) 300 MG capsule Take 300 mg by mouth 2 (two) times daily. 08/26/21   [provider]  famotidine (PEPCID) 20 MG tablet Take 1 tablet (20 mg total) by mouth 2 (two) times daily. 11/07/21   Danelle Berry, PA-C  fluticasone (FLONASE) 50 MCG/ACT nasal spray Place 2 sprays into both nostrils daily. 09/20/18   Sowles, Danna Hefty, MD  fluticasone furoate-vilanterol (BREO  ELLIPTA) 100-25 MCG/ACT AEPB Inhale 1 puff into the lungs daily. 08/30/21   Alba Cory, MD  metFORMIN (GLUCOPHAGE XR) 750 MG 24 hr tablet Take 1 tablet (750 mg total) by mouth daily with breakfast. 08/30/21   Alba Cory, MD  omeprazole (PRILOSEC) 40 MG capsule Take 1 capsule (40 mg total) by mouth in the morning. 11/14/21   Carlynn Purl, Danna Hefty, MD  ondansetron (ZOFRAN-ODT) 8 MG disintegrating tablet Take 1 tablet (8 mg total) by mouth every 8 (eight) hours as needed for nausea or vomiting. 11/14/21   Alba Cory, MD  valsartan-hydrochlorothiazide (DIOVAN HCT) 80-12.5 MG tablet Take 1 tablet by mouth daily. 08/30/21   Alba Cory, MD      Allergies    Patient has no known allergies.    Review of Systems   Review of Systems  Gastrointestinal:  Positive for vomiting.    Physical Exam Updated Vital Signs BP (!) 129/59 (BP Location: Right Arm)   Pulse 77   Temp 98 F (36.7 C) (Temporal)   Resp 17   Ht 6\' 4"  (1.93 m)   Wt (!) 210 kg   SpO2 100%   BMI 56.36 kg/m  Physical Exam Vitals and nursing note reviewed.  Constitutional:      General: He is not in acute distress.    Appearance: He is not ill-appearing.  HENT:     Head: Atraumatic.  Eyes:     Conjunctiva/sclera:  Conjunctivae normal.  Cardiovascular:     Rate and Rhythm: Normal rate and regular rhythm.     Pulses: Normal pulses.     Heart sounds: No murmur heard. Pulmonary:     Effort: Pulmonary effort is normal. No respiratory distress.     Breath sounds: Normal breath sounds.  Abdominal:     General: Abdomen is flat. There is no distension.     Palpations: Abdomen is soft.     Tenderness: There is no abdominal tenderness.  Musculoskeletal:        General: Normal range of motion.     Cervical back: Normal range of motion.  Skin:    General: Skin is warm and dry.     Capillary Refill: Capillary refill takes less than 2 seconds.  Neurological:     General: No focal deficit present.     Mental Status: He is  alert.  Psychiatric:        Mood and Affect: Mood normal.     ED Results / Procedures / Treatments   Labs (all labs ordered are listed, but only abnormal results are displayed) Labs Reviewed  LIPASE, BLOOD - Abnormal; Notable for the following components:      Result Value   Lipase <10 (*)    All other components within normal limits  COMPREHENSIVE METABOLIC PANEL - Abnormal; Notable for the following components:   Calcium 10.4 (*)    AST 11 (*)    All other components within normal limits  CBC - Abnormal; Notable for the following components:   Hemoglobin 11.5 (*)    HCT 36.8 (*)    MCV 72.2 (*)    MCH 22.5 (*)    All other components within normal limits    EKG None  Radiology No results found.  Procedures Procedures    Medications Ordered in ED Medications  lactated ringers bolus 1,000 mL (0 mLs Intravenous Stopped 11/18/21 1620)  metoCLOPramide (REGLAN) injection 10 mg (10 mg Intravenous Given 11/18/21 1515)  diphenhydrAMINE (BENADRYL) injection 25 mg (25 mg Intravenous Given 11/18/21 1514)    ED Course/ Medical Decision Making/ A&P                           Medical Decision Making Amount and/or Complexity of Data Reviewed Labs: ordered.  Risk Prescription drug management.   Social determinants of health:  Social History   Socioeconomic History   Marital status: Single    Spouse name: Not on file   Number of children: 0   Years of education: Not on file   Highest education level: Some college, no degree  Occupational History   Occupation: Management consultant   Tobacco Use   Smoking status: Never   Smokeless tobacco: Never  Vaping Use   Vaping Use: Never used  Substance and Sexual Activity   Alcohol use: Yes    Alcohol/week: 1.0 standard drink of alcohol    Types: 1 Cans of beer per week   Drug use: Not Currently   Sexual activity: Yes    Partners: Female    Comment: not always   Other Topics Concern   Not on file  Social History  Narrative   Not on file   Social Determinants of Health   Financial Resource Strain: Low Risk  (08/01/2021)   Overall Financial Resource Strain (CARDIA)    Difficulty of Paying Living Expenses: Not hard at all  Food Insecurity: No Food Insecurity (08/01/2021)  Hunger Vital Sign    Worried About Running Out of Food in the Last Year: Never true    Ran Out of Food in the Last Year: Never true  Transportation Needs: No Transportation Needs (08/01/2021)   PRAPARE - Hydrologist (Medical): No    Lack of Transportation (Non-Medical): No  Physical Activity: Insufficiently Active (08/01/2021)   Exercise Vital Sign    Days of Exercise per Week: 2 days    Minutes of Exercise per Session: 20 min  Stress: No Stress Concern Present (08/01/2021)   [place]    Feeling of Stress : Not at all  Social Connections: Moderately Isolated (08/01/2021)   Social Connection and Isolation Panel [NHANES]    Frequency of Communication with Friends and Family: More than three times a week    Frequency of Social Gatherings with Friends and Family: More than three times a week    Attends Religious Services: Never    Marine scientist or Organizations: Yes    Attends Archivist Meetings: Never    Marital Status: Never married  Intimate Partner Violence: Not At Risk (08/01/2021)   Humiliation, Afraid, Rape, and Kick questionnaire    Fear of Current or Ex-Partner: No    Emotionally Abused: No    Physically Abused: No    Sexually Abused: No     Initial impression:  This patient presents to the ED for concern of nausea and vomiting, this involves an extensive number of treatment options, and is a complaint that carries with it a high risk of complications and morbidity.   Differentials include infection, gastritis, cannabinoid hyperemesis syndrome, pancreatitis.   Comorbidities affecting care:   Obesity  Additional history obtained: None  Lab Tests  I Ordered, reviewed, and interpreted labs and EKG.  The pertinent results include:  CMP, CBC and lipase normal   Medicines ordered and prescription drug management:  I ordered medication including: LR bolus Reglan 10 mg Benadryl 25 mg Reevaluation of the patient after these medicines showed that the patient resolved I have reviewed the patients home medicines and have made adjustments as needed   ED Course/Re-evaluation: 31 year old male presents emergency department for evaluation of intractable nausea and vomiting x2 weeks.  Vitals are without significant abnormality.  On exam, he is overall well-appearing and nontoxic.  He has no tenderness to palpation abdominally.  Labs without acute findings.  Patient states that he does not use cannabis so concern for CHS is low.  He was given fluids with Reglan and Benadryl with resolution of symptoms.  He reports feeling much better.  Will discharge home with Reglan and Benadryl to use until he can be seen by GI this week.  Patient is appreciative and amenable to plan.  Disposition:  After consideration of the diagnostic results, physical exam, history and the patients response to treatment feel that the patent would benefit from discharge.   Nausea and vomiting: Plan and management as described above. Discharged home in good condition. Final Clinical Impression(s) / ED Diagnoses Final diagnoses:  Nausea and vomiting, unspecified vomiting type    Rx / DC Orders ED Discharge Orders          Ordered    metoCLOPramide (REGLAN) 10 MG tablet  Every 6 hours        11/18/21 1622              Tonye Tonelli, [place] 11/18/21 2225  [place] St[place] P, DO [id number] [phone]

## 2021-11-19 ENCOUNTER — Telehealth: Payer: Self-pay

## 2021-11-19 ENCOUNTER — Other Ambulatory Visit: Payer: Self-pay | Admitting: Family Medicine

## 2021-11-19 DIAGNOSIS — R1013 Epigastric pain: Secondary | ICD-10-CM

## 2021-11-19 DIAGNOSIS — R112 Nausea with vomiting, unspecified: Secondary | ICD-10-CM

## 2021-11-21 ENCOUNTER — Other Ambulatory Visit: Payer: Self-pay

## 2021-11-21 ENCOUNTER — Emergency Department
Admission: EM | Admit: 2021-11-21 | Discharge: 2021-11-21 | Disposition: A | Discharge status: Home / Self Care | Payer: Federal, State, Local not specified - PPO | Attending: Emergency Medicine | Admitting: Emergency Medicine

## 2021-11-21 ENCOUNTER — Encounter: Payer: Self-pay | Admitting: Emergency Medicine

## 2021-11-21 ENCOUNTER — Emergency Department: Payer: Federal, State, Local not specified - PPO

## 2021-11-21 DIAGNOSIS — J0130 Acute sphenoidal sinusitis, unspecified: Secondary | ICD-10-CM | POA: Diagnosis not present

## 2021-11-21 DIAGNOSIS — R112 Nausea with vomiting, unspecified: Secondary | ICD-10-CM | POA: Diagnosis not present

## 2021-11-21 DIAGNOSIS — K429 Umbilical hernia without obstruction or gangrene: Secondary | ICD-10-CM | POA: Diagnosis not present

## 2021-11-21 DIAGNOSIS — R519 Headache, unspecified: Secondary | ICD-10-CM | POA: Diagnosis not present

## 2021-11-21 DIAGNOSIS — R9431 Abnormal electrocardiogram [ECG] [EKG]: Secondary | ICD-10-CM | POA: Diagnosis not present

## 2021-11-21 DIAGNOSIS — N2889 Other specified disorders of kidney and ureter: Secondary | ICD-10-CM | POA: Diagnosis not present

## 2021-11-21 DIAGNOSIS — K760 Fatty (change of) liver, not elsewhere classified: Secondary | ICD-10-CM | POA: Diagnosis not present

## 2021-11-21 LAB — CBC WITH DIFFERENTIAL/PLATELET
Abs Immature Granulocytes: 0.04 10*3/uL (ref 0.00–0.07)
Basophils Absolute: 0 10*3/uL (ref 0.0–0.1)
Basophils Relative: 0 %
Eosinophils Absolute: 0.1 10*3/uL (ref 0.0–0.5)
Eosinophils Relative: 2 %
HCT: 36.3 % — ABNORMAL LOW (ref 39.0–52.0)
Hemoglobin: 11.2 g/dL — ABNORMAL LOW (ref 13.0–17.0)
Immature Granulocytes: 1 %
Lymphocytes Relative: 23 %
Lymphs Abs: 1.8 10*3/uL (ref 0.7–4.0)
MCH: 22.8 pg — ABNORMAL LOW (ref 26.0–34.0)
MCHC: 30.9 g/dL (ref 30.0–36.0)
MCV: 73.8 fL — ABNORMAL LOW (ref 80.0–100.0)
Monocytes Absolute: 0.5 10*3/uL (ref 0.1–1.0)
Monocytes Relative: 6 %
Neutro Abs: 5.3 10*3/uL (ref 1.7–7.7)
Neutrophils Relative %: 68 %
Platelets: 256 10*3/uL (ref 150–400)
RBC: 4.92 MIL/uL (ref 4.22–5.81)
RDW: 14.6 % (ref 11.5–15.5)
WBC: 7.9 10*3/uL (ref 4.0–10.5)
nRBC: 0 % (ref 0.0–0.2)

## 2021-11-21 LAB — COMPREHENSIVE METABOLIC PANEL
ALT: 11 U/L (ref 0–44)
AST: 14 U/L — ABNORMAL LOW (ref 15–41)
Albumin: 3.5 g/dL (ref 3.5–5.0)
Alkaline Phosphatase: 65 U/L (ref 38–126)
Anion gap: 7 (ref 5–15)
BUN: 18 mg/dL (ref 6–20)
CO2: 26 mmol/L (ref 22–32)
Calcium: 9.3 mg/dL (ref 8.9–10.3)
Chloride: 108 mmol/L (ref 98–111)
Creatinine, Ser: 0.83 mg/dL (ref 0.61–1.24)
GFR, Estimated: >60 mL/min (ref >60)
Glucose, Bld: 117 mg/dL — ABNORMAL HIGH (ref 70–99)
Potassium: 4.3 mmol/L (ref 3.5–5.1)
Sodium: 141 mmol/L (ref 135–145)
Total Bilirubin: 0.7 mg/dL (ref 0.3–1.2)
Total Protein: 7.7 g/dL (ref 6.5–8.1)

## 2021-11-21 LAB — URINALYSIS, ROUTINE W REFLEX MICROSCOPIC
Bilirubin Urine: NEGATIVE
Glucose, UA: NEGATIVE mg/dL
Hgb urine dipstick: NEGATIVE
Ketones, ur: NEGATIVE mg/dL
Leukocytes,Ua: NEGATIVE
Nitrite: NEGATIVE
Protein, ur: NEGATIVE mg/dL
Specific Gravity, Urine: >1.046 — ABNORMAL HIGH (ref 1.005–1.030)
pH: 6 (ref 5.0–8.0)

## 2021-11-21 LAB — LIPASE, BLOOD: Lipase: 28 U/L (ref 11–51)

## 2021-11-21 MED ORDER — AMOXICILLIN-POT CLAVULANATE 875-125 MG PO TABS: 1.0000 | ORAL_TABLET | Freq: Two times a day (BID) | ORAL | 0 refills | Status: DC

## 2021-11-21 MED ORDER — IOHEXOL 350 MG/ML SOLN
100.0000 mL | Freq: Once | INTRAVENOUS | Status: AC | PRN
  Administered 2021-11-21: 100 mL via INTRAVENOUS

## 2021-11-21 MED ORDER — PROCHLORPERAZINE MALEATE 10 MG PO TABS: 10.0000 mg | ORAL_TABLET | Freq: Four times a day (QID) | ORAL | 0 refills | Status: DC | PRN

## 2021-11-21 MED ORDER — PROCHLORPERAZINE EDISYLATE 10 MG/2ML IJ SOLN
10.0000 mg | Freq: Once | INTRAMUSCULAR | Status: AC
Start: 2021-11-21 — End: 2021-11-21
  Administered 2021-11-21: 10 mg via INTRAVENOUS
  Filled 2021-11-21: qty 2

## 2021-11-21 NOTE — Discharge Instructions (Addendum)
Please continue to eat and drink try small amounts of fluid and food frequently.  Please use either the Compazine 1 pill 3 times a day or one of the other nausea medications you may have left.  Do not mix them however separate any doses by at least 6 hours.  Follow-up with GI as planned.  Try to give Dr. Ferd Hibbs office a call.  He should be able to see you sooner.  Let him know that you were seen in the emergency room and that Dr. Archie Balboa, the ER doctor, spoke with [facility] and [facility] said it would be fine if you want to see Dr. Timothy Lasso because they are having problems with their appointment system. Please take the Augmentin 1 pill twice a day for the sphenoid sinus infection.  Please return for any further problems.

## 2021-11-21 NOTE — ED Provider Notes (Signed)
[facility] Provider Note    Event Date/Time   First MD Initiated Contact with Patient 11/21/21 [phone]     (approximate)   History   Emesis   HPI  [name] is a 31 y.o. male reports almost 3 weeks of nausea and vomiting.  He has seen both his primary care doctor and emergency medicine without any relief.  Labs have been normal.  He denies using any marijuana.  Old records show that this has been true in the past as well.  Patient reports nausea and vomiting every day multiple times.  He is having bowel movements.  He is not losing weight.  He is able to eat.  This morning he woke up with a bad headache which has gradually improved.  He was referred to gastroenterology but has not been able to get an appoint with them until November.  He has had test for H. pylori negative several times. He initially thought it may have been due to something he ate but he had other people ate the same food with him and that they are not sick.     Physical Exam   Triage Vital Signs: ED Triage Vitals  Enc Vitals Group     BP 11/21/21 0629 (!) 154/96     Pulse Rate 11/21/21 0629 100     Resp 11/21/21 0629 18     Temp 11/21/21 0629 98.2 F (36.8 C)     Temp Source 11/21/21 0629 Oral     SpO2 11/21/21 0629 100 %     Weight 11/21/21 0627 (!) 463 lb (210 kg)     Height 11/21/21 0627 6\' 4"  (1.93 m)     Head Circumference --      Peak Flow --      Pain Score 11/21/21 0630 6     Pain Loc --      Pain Edu? --      Excl. in GC? --     Most recent vital signs: Vitals:   11/21/21 1003 11/21/21 1244  BP: 108/65 111/69  Pulse: 75 87  Resp: 12 16  Temp:    SpO2: 100% 98%     General: Awake, no distress.  Head normocephalic atraumatic Eyes pupils equal round extraocular movements intact fundi are normal CV:  Good peripheral perfusion.  Heart regular rate and rhythm no audible murmur Resp:  Normal effort.  Lungs are clear Abd:  No distention.  Soft currently  nontender no organomegaly palpable bowel sounds are normal    ED Results / Procedures / Treatments   Labs (all labs ordered are listed, but only abnormal results are displayed) Labs Reviewed  CBC WITH DIFFERENTIAL/PLATELET - Abnormal; Notable for the following components:      Result Value   Hemoglobin 11.2 (*)    HCT 36.3 (*)    MCV 73.8 (*)    MCH 22.8 (*)    All other components within normal limits  COMPREHENSIVE METABOLIC PANEL - Abnormal; Notable for the following components:   Glucose, Bld 117 (*)    AST 14 (*)    All other components within normal limits  URINALYSIS, ROUTINE W REFLEX MICROSCOPIC - Abnormal; Notable for the following components:   Color, Urine YELLOW (*)    APPearance CLEAR (*)    Specific Gravity, Urine >1.046 (*)    All other components within normal limits  LIPASE, BLOOD     EKG     RADIOLOGY Head CT negative except for  sphenoid sinusitis.  CT reviewed by me and interpreted by me as well I agree. Belly CT negative except for fatty liver.  There is a fat filled umbilical hernia and patient CT reportedly showed some thickening of the skin suprapubically but I do not see any when I examined him myself.  PROCEDURES:  Critical Care performed:   Procedures   MEDICATIONS ORDERED IN ED: Medications  prochlorperazine (COMPAZINE) injection 10 mg (10 mg Intravenous Given 11/21/21 0739)  iohexol (OMNIPAQUE) 350 MG/ML injection 100 mL (100 mLs Intravenous Contrast Given 11/21/21 0748)     IMPRESSION / MDM / ASSESSMENT AND PLAN / ED COURSE  I reviewed the triage vital signs and the nursing notes. Patient better after Compazine.  I gave him some Compazine to try at home.  I have spoke given with Dr. Timothy Lasso gastroenterology who noted that he had an appointment with [facility] clinic which was canceled he wanted me to check with [facility] clinic to make sure he was not stealing the patient and I did so and he was not [facility] clinic today is having problems  with their appointment system and Dr. Tobi Bastos told me to go ahead and let him be seen by Dr. Timothy Lasso which I attempted to do but I could not get a hold of Dr. Timothy Lasso again right away.  I instructed the patient to call and let Dr. Timothy Lasso know.  Differential diagnosis includes, but is not limited to, patient by history does not have hyperemesis due to marijuana.  He does not have any sign of obstruction or infection.  I am not sure why he is vomiting but I have got him  Patient's presentation is most consistent with acute complicated illness / injury requiring diagnostic workup.  he patient is on the cardiac monitor to evaluate for evidence of arrhythmia and/or significant heart rate changes.  None were seen      FINAL CLINICAL IMPRESSION(S) / ED DIAGNOSES   Final diagnoses:  Acute sphenoidal sinusitis, recurrence not specified  Nausea and vomiting, unspecified vomiting type     Rx / DC Orders   ED Discharge Orders          Ordered    prochlorperazine (COMPAZINE) 10 MG tablet  Every 6 hours PRN        11/21/21 1209    amoxicillin-clavulanate (AUGMENTIN) 875-125 MG tablet  2 times daily        11/21/21 1236             Note:  This document was prepared using Dragon voice recognition software and may include unintentional dictation errors.   Arnaldo Natal, MD 11/21/21 [phone]

## 2021-11-21 NOTE — ED Triage Notes (Signed)
Patient ambulatory to triage with steady gait, without difficulty or distress noted; pt reports N/V x 3wks; seen PCP for same and was also at [facility] ED recently as well without relief of symptoms; was referred to GI but unable to obtain appt until November; reports generalized HA and abd "soreness"

## 2021-11-28 NOTE — Progress Notes (Signed)
Name: [name]   MRN: [id number]    DOB: 10-06-1990   Date:11/29/2021       Progress Note  Subjective  Chief Complaint  Follow Up  HPI  Asthma intermittent : currently under control, he is taking antibiotics for sinus infection, but no cough, wheezing or sob, only Breo for flares   Morbid obesity:  His weight at age 31 was 312 lbs.He was taking Metformin and we added Rybelsus in the Fall 22 but off medication due to nausea that is undiagnosed. He is eating slowly, he is vomiting but usually in the a and it is liquid  HTN: currently on diovan hctz 80/12.5 , no side effects of medication, bp has been at goal, no chest pain or palpitation   Pre-diabetes: last A1C 5.8  Nov 22 . He denies polyphagia, polydipsia or polyuria , no longer taking Metformin or Rybelsus due to nausea and vomiting.   Daily nausea: symptoms started early June, we stopped Metformin and Rybelsus but symptoms did not resolve, he has been to [facility] twice, had CT brain and Abdomen that showed umbilical hernia, fatty liver and splenomegaly, plus sinusitis. He has been able to work but wakes up nauseated and vomits daily , taking pepcic, PPI, reglan, compazine and zofran to be able to control symptoms. He has follow up with GI next Tuesday   Patient Active Problem List   Diagnosis Date Noted   Pre-diabetes 04/12/2021   Primary hypertension 04/12/2021   COVID-19 virus infection 02/07/2019   Asthma, mild intermittent    Morbid obesity ([place]) 02/08/2008    No past surgical history on file.  Family History  Problem Relation Age of Onset   Obesity Mother    Kidney disease Father        mass   Liver cancer Father    Obesity Sister     Social History   Tobacco Use   Smoking status: Never   Smokeless tobacco: Never  Substance Use Topics   Alcohol use: Yes    Alcohol/week: 1.0 standard drink of alcohol    Types: 1 Cans of beer per week     Current Outpatient Medications:    albuterol (VENTOLIN HFA) 108 (90 Base)  MCG/ACT inhaler, Inhale 2 puffs into the lungs every 4 (four) hours as needed for wheezing or shortness of breath., Disp: 18 g, Rfl: 0   amoxicillin-clavulanate (AUGMENTIN) 875-125 MG tablet, Take 1 tablet by mouth 2 (two) times daily., Disp: 20 tablet, Rfl: 0   azelastine (ASTELIN) 0.1 % nasal spray, Place 2 sprays into both nostrils 2 (two) times daily. Use in each nostril as directed, Disp: 30 mL, Rfl: 2   cefdinir (OMNICEF) 300 MG capsule, Take 300 mg by mouth 2 (two) times daily., Disp: , Rfl:    famotidine (PEPCID) 20 MG tablet, Take 1 tablet (20 mg total) by mouth 2 (two) times daily., Disp: 60 tablet, Rfl: 0   fluticasone (FLONASE) 50 MCG/ACT nasal spray, Place 2 sprays into both nostrils daily., Disp: 16 g, Rfl: 2   fluticasone furoate-vilanterol (BREO ELLIPTA) 100-25 MCG/ACT AEPB, Inhale 1 puff into the lungs daily., Disp: 1 each, Rfl: 2   metoCLOPramide (REGLAN) 10 MG tablet, Take 1 tablet (10 mg total) by mouth every 6 (six) hours., Disp: 30 tablet, Rfl: 0   omeprazole (PRILOSEC) 40 MG capsule, Take 1 capsule (40 mg total) by mouth in the morning., Disp: 30 capsule, Rfl: 0   ondansetron (ZOFRAN-ODT) 8 MG disintegrating tablet, Take 1 tablet (8 mg  total) by mouth every 8 (eight) hours as needed for nausea or vomiting., Disp: 40 tablet, Rfl: 0   prochlorperazine (COMPAZINE) 10 MG tablet, Take 1 tablet (10 mg total) by mouth every 6 (six) hours as needed for nausea or vomiting., Disp: 30 tablet, Rfl: 0   valsartan-hydrochlorothiazide (DIOVAN HCT) 80-12.5 MG tablet, Take 1 tablet by mouth daily., Disp: 90 tablet, Rfl: 0   metFORMIN (GLUCOPHAGE XR) 750 MG 24 hr tablet, Take 1 tablet (750 mg total) by mouth daily with breakfast. (Patient not taking: Reported on 11/29/2021), Disp: 90 tablet, Rfl: 0  No Known Allergies  I personally reviewed active problem list, medication list, allergies, family history, social history, health maintenance with the patient/caregiver today.   ROS  Ten systems  reviewed and is negative except as mentioned in HPI   Objective  Vitals:   11/29/21 0953  BP: 122/84  Pulse: 92  Resp: 16  SpO2: 98%  Weight: (!) 474 lb (215 kg)  Height: 6' 4"  (1.93 m)    Body mass index is 57.7 kg/m.  Physical Exam  Constitutional: Patient appears well-developed and well-nourished. Obese  No distress.  HEENT: head atraumatic, normocephalic, pupils equal and reactive to light, neck supple Cardiovascular: Normal rate, regular rhythm and normal heart sounds.  No murmur heard. No BLE edema. Pulmonary/Chest: Effort normal and breath sounds normal. No respiratory distress. Abdominal: Soft.  There is no tenderness. Psychiatric: Patient has a normal mood and affect. behavior is normal. Judgment and thought content normal.   Recent Results (from the past 2160 hour(s))  COMPLETE METABOLIC PANEL WITH GFR     Status: None   Collection Time: 11/05/21 10:27 AM  Result Value Ref Range   Glucose, Bld 92 65 - 99 mg/dL    Comment: .            Fasting reference interval .    BUN 17 7 - 25 mg/dL   Creat 1.00 0.60 - 1.24 mg/dL   eGFR 104 > OR = 60 mL/min/1.17m    Comment: The eGFR is based on the CKD-EPI 2021 equation. To calculate  the new eGFR from a previous Creatinine or Cystatin C result, go to https://www.kidney.org/professionals/ kdoqi/gfr%5Fcalculator    BUN/Creatinine Ratio NOT APPLICABLE 6 - 22 (calc)   Sodium 140 135 - 146 mmol/L   Potassium 4.6 3.5 - 5.3 mmol/L   Chloride 104 98 - 110 mmol/L   CO2 27 20 - 32 mmol/L   Calcium 9.8 8.6 - 10.3 mg/dL   Total Protein 7.7 6.1 - 8.1 g/dL   Albumin 4.1 3.6 - 5.1 g/dL   Globulin 3.6 1.9 - 3.7 g/dL (calc)   AG Ratio 1.1 1.0 - 2.5 (calc)   Total Bilirubin 0.5 0.2 - 1.2 mg/dL   Alkaline phosphatase (APISO) 71 36 - 130 U/L   AST 12 10 - 40 U/L   ALT 13 9 - 46 U/L  CBC with Differential/Platelet     Status: Abnormal   Collection Time: 11/05/21 10:27 AM  Result Value Ref Range   WBC 8.6 3.8 - 10.8 Thousand/uL    RBC 5.17 4.20 - 5.80 Million/uL   Hemoglobin 11.9 (L) 13.2 - 17.1 g/dL   HCT 37.4 (L) 38.5 - 50.0 %   MCV 72.3 (L) 80.0 - 100.0 fL   MCH 23.0 (L) 27.0 - 33.0 pg   MCHC 31.8 (L) 32.0 - 36.0 g/dL   RDW 15.7 (H) 11.0 - 15.0 %   Platelets 291 140 - 400 Thousand/uL  MPV 11.5 7.5 - 12.5 fL   Neutro Abs 5,719 1,500 - 7,800 cells/uL   Lymphs Abs 2,150 850 - 3,900 cells/uL   Absolute Monocytes 464 200 - 950 cells/uL   Eosinophils Absolute 224 15 - 500 cells/uL   Basophils Absolute 43 0 - 200 cells/uL   Neutrophils Relative % 66.5 %   Total Lymphocyte 25.0 %   Monocytes Relative 5.4 %   Eosinophils Relative 2.6 %   Basophils Relative 0.5 %  H. pylori breath test     Status: None   Collection Time: 11/12/21  9:00 AM  Result Value Ref Range   H. pylori Breath Test NOT DETECTED NOT DETECTED    Comment: . Antimicrobials, proton pump inhibitors, and bismuth preparations are known to suppress H. pylori, and  ingestion of these prior to H. pylori diagnostic testing may lead to false negative results. If clinically  indicated, the test may be repeated on a new specimen obtained two weeks after discontinuing treatment. However, a positive result is still clinically valid.   Lipase     Status: None   Collection Time: 11/12/21  9:00 AM  Result Value Ref Range   Lipase 8 7 - 60 U/L  Lipase, blood     Status: Abnormal   Collection Time: 11/18/21 11:32 AM  Result Value Ref Range   Lipase <10 (L) 11 - 51 U/L    Comment: Performed at [facility], [address] [place], [place] [address]  Comprehensive metabolic panel     Status: Abnormal   Collection Time: 11/18/21 11:32 AM  Result Value Ref Range   Sodium 138 135 - 145 mmol/L   Potassium 3.9 3.5 - 5.1 mmol/L   Chloride 103 98 - 111 mmol/L   CO2 25 22 - 32 mmol/L   Glucose, Bld 94 70 - 99 mg/dL    Comment: Glucose reference range applies only to samples taken after fasting for at least 8 hours.   BUN 14 6 - 20 mg/dL    Creatinine, Ser 0.87 0.61 - 1.24 mg/dL   Calcium 10.4 (H) 8.9 - 10.3 mg/dL   Total Protein 8.0 6.5 - 8.1 g/dL   Albumin 4.3 3.5 - 5.0 g/dL   AST 11 (L) 15 - 41 U/L   ALT 10 0 - 44 U/L   Alkaline Phosphatase 63 38 - 126 U/L   Total Bilirubin 0.8 0.3 - 1.2 mg/dL   GFR, Estimated >60 >60 mL/min    Comment: (NOTE) Calculated using the CKD-EPI Creatinine Equation (2021)    Anion gap 10 5 - 15    Comment: Performed at [facility], [address], [place], [place] [address]  CBC     Status: Abnormal   Collection Time: 11/18/21 11:32 AM  Result Value Ref Range   WBC 9.7 4.0 - 10.5 K/uL   RBC 5.10 4.22 - 5.81 MIL/uL   Hemoglobin 11.5 (L) 13.0 - 17.0 g/dL   HCT 36.8 (L) 39.0 - 52.0 %   MCV 72.2 (L) 80.0 - 100.0 fL   MCH 22.5 (L) 26.0 - 34.0 pg   MCHC 31.3 30.0 - 36.0 g/dL   RDW 15.0 11.5 - 15.5 %   Platelets 282 150 - 400 K/uL   nRBC 0.0 0.0 - 0.2 %    Comment: Performed at [facility], [address] [place], [place] [address]  CBC with Differential     Status: Abnormal   Collection Time: 11/21/21  6:44 AM  Result Value  Ref Range   WBC 7.9 4.0 - 10.5 K/uL   RBC 4.92 4.22 - 5.81 MIL/uL   Hemoglobin 11.2 (L) 13.0 - 17.0 g/dL   HCT 36.3 (L) 39.0 - 52.0 %   MCV 73.8 (L) 80.0 - 100.0 fL   MCH 22.8 (L) 26.0 - 34.0 pg   MCHC 30.9 30.0 - 36.0 g/dL   RDW 14.6 11.5 - 15.5 %   Platelets 256 150 - 400 K/uL   nRBC 0.0 0.0 - 0.2 %   Neutrophils Relative % 68 %   Neutro Abs 5.3 1.7 - 7.7 K/uL   Lymphocytes Relative 23 %   Lymphs Abs 1.8 0.7 - 4.0 K/uL   Monocytes Relative 6 %   Monocytes Absolute 0.5 0.1 - 1.0 K/uL   Eosinophils Relative 2 %   Eosinophils Absolute 0.1 0.0 - 0.5 K/uL   Basophils Relative 0 %   Basophils Absolute 0.0 0.0 - 0.1 K/uL   Immature Granulocytes 1 %   Abs Immature Granulocytes 0.04 0.00 - 0.07 K/uL    Comment: Performed at [facility], [address] [place], [place] [address]  Comprehensive metabolic  panel     Status: Abnormal   Collection Time: 11/21/21  6:44 AM  Result Value Ref Range   Sodium 141 135 - 145 mmol/L   Potassium 4.3 3.5 - 5.1 mmol/L   Chloride 108 98 - 111 mmol/L   CO2 26 22 - 32 mmol/L   Glucose, Bld 117 (H) 70 - 99 mg/dL    Comment: Glucose reference range applies only to samples taken after fasting for at least 8 hours.   BUN 18 6 - 20 mg/dL   Creatinine, Ser 0.83 0.61 - 1.24 mg/dL   Calcium 9.3 8.9 - 10.3 mg/dL   Total Protein 7.7 6.5 - 8.1 g/dL   Albumin 3.5 3.5 - 5.0 g/dL   AST 14 (L) 15 - 41 U/L   ALT 11 0 - 44 U/L   Alkaline Phosphatase 65 38 - 126 U/L   Total Bilirubin 0.7 0.3 - 1.2 mg/dL   GFR, Estimated >60 >60 mL/min    Comment: (NOTE) Calculated using the CKD-EPI Creatinine Equation (2021)    Anion gap 7 5 - 15    Comment: Performed at [facility], [address] [place], [place] [address]  Lipase, blood     Status: None   Collection Time: 11/21/21  6:44 AM  Result Value Ref Range   Lipase 28 11 - 51 U/L    Comment: Performed at [facility], [address] [place], [place] [address]  Urinalysis, Routine w reflex microscopic Urine, Clean Catch     Status: Abnormal   Collection Time: 11/21/21  6:44 AM  Result Value Ref Range   Color, Urine YELLOW (A) YELLOW   APPearance CLEAR (A) CLEAR   Specific Gravity, Urine >1.046 (H) 1.005 - 1.030   pH 6.0 5.0 - 8.0   Glucose, UA NEGATIVE NEGATIVE mg/dL   Hgb urine dipstick NEGATIVE NEGATIVE   Bilirubin Urine NEGATIVE NEGATIVE   Ketones, ur NEGATIVE NEGATIVE mg/dL   Protein, ur NEGATIVE NEGATIVE mg/dL   Nitrite NEGATIVE NEGATIVE   Leukocytes,Ua NEGATIVE NEGATIVE    Comment: Performed at [facility], [address] [place], [place] [address]    PHQ2/9:    11/29/2021    9:53 AM 11/07/2021    2:14 PM 11/05/2021    9:56 AM 10/09/2021    8:19 AM 09/04/2021   10:01 AM  Depression screen PHQ 2/9  Decreased Interest 0 0 0 0 0  Down, Depressed, Hopeless 0 0 0 0 0  PHQ -  2 Score 0 0 0 0 0  Altered sleeping 0 0   0  Tired, decreased energy 0 0   3  Change in appetite 0 0   0  Feeling bad or failure about yourself  0 0   0  Trouble concentrating 0 0   0  Moving slowly or fidgety/restless 0 0   0  Suicidal thoughts 0 0   0  PHQ-9 Score 0 0   3  Difficult doing work/chores  Not difficult at all   Somewhat difficult    phq 9 is negative   Fall Risk:    11/29/2021    9:53 AM 11/07/2021    2:14 PM 11/05/2021    9:55 AM 10/09/2021    8:19 AM 09/04/2021   10:00 AM  Fall Risk   Falls in the past year? 0 0 0 0 0  Number falls in past yr: 0 0 0 0   Injury with Fall? 0 0 0 0   Risk for fall due to : No Fall Risks No Fall Risks No Fall Risks No Fall Risks   Follow up Falls prevention discussed Falls prevention discussed;Education provided Falls prevention discussed Falls prevention discussed Falls prevention discussed      Functional Status Survey: Is the patient deaf or have difficulty hearing?: No Does the patient have difficulty seeing, even when wearing glasses/contacts?: No Does the patient have difficulty concentrating, remembering, or making decisions?: No Does the patient have difficulty walking or climbing stairs?: No Does the patient have difficulty dressing or bathing?: No Does the patient have difficulty doing errands alone such as visiting a doctor's office or shopping?: No    Assessment & Plan  1. Fatty liver  Reviewed CT with patient   2. Splenomegaly  Keep follow up with GI  3. Morbid obesity ([place])   Discussed with the patient the risk posed by an increased BMI. Discussed importance of portion control, calorie counting and at least 150 minutes of physical activity weekly. Avoid sweet beverages and drink more water. Eat at least 6 servings of fruit and vegetables daily    4. Daily nausea   5. Primary hypertension  - valsartan-hydrochlorothiazide (DIOVAN HCT) 80-12.5 MG tablet; Take 1 tablet by mouth daily.  Dispense: 90  tablet; Refill: 1  6. Pre-diabetes

## 2021-11-29 ENCOUNTER — Encounter: Payer: Self-pay | Admitting: Family Medicine

## 2021-11-29 ENCOUNTER — Ambulatory Visit: Payer: Federal, State, Local not specified - PPO | Admitting: Family Medicine

## 2021-11-29 VITALS — BP 122/84 | HR 92 | Resp 16 | Ht 76.0 in | Wt >= 6400 oz

## 2021-11-29 DIAGNOSIS — I10 Essential (primary) hypertension: Secondary | ICD-10-CM

## 2021-11-29 DIAGNOSIS — R110 Nausea: Secondary | ICD-10-CM

## 2021-11-29 DIAGNOSIS — K429 Umbilical hernia without obstruction or gangrene: Secondary | ICD-10-CM

## 2021-11-29 DIAGNOSIS — K760 Fatty (change of) liver, not elsewhere classified: Secondary | ICD-10-CM | POA: Insufficient documentation

## 2021-11-29 DIAGNOSIS — R7303 Prediabetes: Secondary | ICD-10-CM

## 2021-11-29 DIAGNOSIS — R161 Splenomegaly, not elsewhere classified: Secondary | ICD-10-CM | POA: Diagnosis not present

## 2021-11-29 MED ORDER — VALSARTAN-HYDROCHLOROTHIAZIDE 80-12.5 MG PO TABS: 1.0000 | ORAL_TABLET | Freq: Every day | ORAL | 1 refills | Status: DC

## 2021-12-03 DIAGNOSIS — R112 Nausea with vomiting, unspecified: Secondary | ICD-10-CM | POA: Diagnosis not present

## 2021-12-04 ENCOUNTER — Other Ambulatory Visit: Payer: Self-pay | Admitting: Family Medicine

## 2021-12-04 DIAGNOSIS — R1114 Bilious vomiting: Secondary | ICD-10-CM

## 2021-12-04 DIAGNOSIS — R112 Nausea with vomiting, unspecified: Secondary | ICD-10-CM

## 2021-12-09 ENCOUNTER — Other Ambulatory Visit: Payer: Self-pay | Admitting: Gastroenterology

## 2021-12-09 DIAGNOSIS — R112 Nausea with vomiting, unspecified: Secondary | ICD-10-CM

## 2021-12-11 ENCOUNTER — Other Ambulatory Visit: Payer: Self-pay | Admitting: Family Medicine

## 2021-12-11 DIAGNOSIS — R1013 Epigastric pain: Secondary | ICD-10-CM

## 2021-12-17 ENCOUNTER — Other Ambulatory Visit: Payer: Federal, State, Local not specified - PPO

## 2021-12-27 ENCOUNTER — Encounter
Admission: RE | Admit: 2021-12-27 | Discharge: 2021-12-27 | Disposition: A | Discharge status: Home / Self Care | Payer: Federal, State, Local not specified - PPO | Source: Ambulatory Visit | Attending: Gastroenterology | Admitting: Gastroenterology

## 2021-12-27 DIAGNOSIS — R112 Nausea with vomiting, unspecified: Secondary | ICD-10-CM | POA: Diagnosis not present

## 2021-12-27 MED ORDER — TECHNETIUM TC 99M SULFUR COLLOID
2.2900 | Freq: Once | INTRAVENOUS | Status: AC | PRN
  Administered 2021-12-27: 2.29 via ORAL

## 2021-12-31 ENCOUNTER — Other Ambulatory Visit: Payer: Self-pay | Admitting: Gastroenterology

## 2021-12-31 DIAGNOSIS — R112 Nausea with vomiting, unspecified: Secondary | ICD-10-CM

## 2022-01-02 ENCOUNTER — Ambulatory Visit: Payer: Federal, State, Local not specified - PPO | Admitting: Family Medicine

## 2022-01-02 ENCOUNTER — Encounter: Payer: Self-pay | Admitting: Family Medicine

## 2022-01-02 ENCOUNTER — Ambulatory Visit: Payer: Self-pay | Admitting: *Deleted

## 2022-01-02 VITALS — BP 126/82 | HR 109 | Temp 98.4°F | Resp 16 | Ht 76.0 in | Wt >= 6400 oz

## 2022-01-02 DIAGNOSIS — R112 Nausea with vomiting, unspecified: Secondary | ICD-10-CM | POA: Diagnosis not present

## 2022-01-02 DIAGNOSIS — R519 Headache, unspecified: Secondary | ICD-10-CM | POA: Diagnosis not present

## 2022-01-02 DIAGNOSIS — R000 Tachycardia, unspecified: Secondary | ICD-10-CM

## 2022-01-02 MED ORDER — PANTOPRAZOLE SODIUM 40 MG PO TBEC: 40.0000 mg | DELAYED_RELEASE_TABLET | Freq: Every day | ORAL | 3 refills | Status: DC

## 2022-01-02 NOTE — Telephone Encounter (Signed)
  Chief Complaint: headache Symptoms: Headache "All over but mainly in front" 8-9/10 at times, varies. Presently 5-6/10. Does not check BP at home, no missed doses of BP med. Does states GI issues "I vomit every morning,maybe something to do with that." sees GI.  Frequency: Tues AM Pertinent Negatives: Patient denies sinus issues,dizziness.  Disposition: [] ED /[] Urgent Care (no appt availability in office) / [x] Appointment(In office/virtual)/ []  [facility] [place] [facility]/ [] Home Care/ [] Refused Recommended Disposition /[]  Mobile Bus/ []  Follow-up with PCP Additional Notes: CAlled practice for consult, Rhonda, appt secured for this  afternoon. Care advise provided, verbalizes understanding.  Reason for Disposition  [1] MODERATE headache (e.g., interferes with normal activities) AND [2] present > 24 hours AND [3] unexplained  (Exceptions: analgesics not tried, typical migraine, or headache part of viral illness)  Answer Assessment - Initial Assessment Questions 1. LOCATION: "Where does it hurt?"      All over, mainly in front 2. ONSET: "When did the headache start?" (Minutes, hours or days)      Tues AM 3. PATTERN: "Does the pain come and go, or has it been constant since it started?"     Better Wednesday 4. SEVERITY: "How bad is the pain?" and "What does it keep you from doing?"  (e.g., Scale 1-10; mild, moderate, or severe)   - MILD (1-3): doesn't interfere with normal activities    - MODERATE (4-7): interferes with normal activities or awakens from sleep    - SEVERE (8-10): excruciating pain, unable to do any normal activities        5-6/10 5. RECURRENT SYMPTOM: "Have you ever had headaches before?" If Yes, ask: "When was the last time?" and "What happened that time?"      No 6. CAUSE: "What do you think is causing the headache?"     "Like a tension headache." 7. MIGRAINE: "Have you been diagnosed with migraine headaches?" If Yes, ask: "Is this headache similar?"      no 8. HEAD  INJURY: "Has there been any recent injury to the head?"      no 9. OTHER SYMPTOMS: "Do you have any other symptoms?" (fever, stiff neck, eye pain, sore throat, cold symptoms)     Nausea and vomiting since June, saw GI, vomits every AM.  Protocols used: Headache-A-AH

## 2022-01-02 NOTE — Patient Instructions (Signed)
You can try reglan dose with a benadryl and rest and see if that helps your headache  You should also try the compazine med - which can be effective for headaches.  Continue to push fluids, rest, and use the tylenol and ibuprofen conservatively since overuse of those can cause worse headaches and NSAIDS can make your stomach and GI issues worse.  You could try a dose of excedrine to see if that is effective.

## 2022-01-02 NOTE — Progress Notes (Signed)
Patient ID: [name], male    DOB: 03/27/1991, 31 y.o.   MRN: [id number]  PCP: Alba Cory, MD  Chief Complaint  Patient presents with   Headache    Started on Tuesday got better yesterday but still can come and go with severe ache.    Subjective:   [name] is a 31 y.o. male, presents to clinic with CC of the following: HA x 2 days, severe Headache  The pain is located in the Frontal region. The pain does not radiate. The pain quality is not similar to prior headaches. The quality of the pain is described as band-like, throbbing and squeezing. The pain is severe. Associated symptoms include nausea and vomiting. Pertinent negatives include no abdominal pain, abnormal behavior, anorexia, back pain, blurred vision, coughing, dizziness, drainage, ear pain, eye pain, eye redness, eye watering, facial sweating, fever, hearing loss, insomnia, loss of balance, muscle aches, neck pain, numbness, phonophobia, photophobia, rhinorrhea, scalp tenderness, seizures, sinus pressure, sore throat, swollen glands, tingling, tinnitus, visual change, weakness or weight loss. Exacerbated by: worse HA after vomiting, no other aggravating factors. He has tried acetaminophen, NSAIDs, cold packs and darkened room for the symptoms. The treatment provided mild relief. His past medical history is significant for obesity. There is no history of cluster headaches, hypertension, immunosuppression, migraine headaches, migraines in the family, pseudotumor cerebri, recent head traumas, sinus disease or TMJ.  He denies vision changes, vertigo, sinus pain/drainage, neck pain and ridgidity, fever No HA history Across forehead Tried 600 mg ibuprofen 2-3 x a day for the past 3 d and 850 mg tylenol for 2-3 x a day as well He has reglan and compazine for nausea but hasn't tried them HA started Tuesday following vomiting and then forceful dryheaving - It hasn't gone away since then, always worse after vomiting - which  occurs every day at least 1-3 x in the am, n/v better later in the day, wakes up reflux of acid - he has seen GI, did gastric emptying study neg, and waiting for RUQ [place]   Recent reviewed documentation of CC Chief Complaint: headache Symptoms: Headache "All over but mainly in front" 8-9/10 at times, varies. Presently 5-6/10. Does not check BP at home, no missed doses of BP med. Does states GI issues "I vomit every morning,maybe something to do with that." sees GI.  Onset 2 days ago, gradually improving but not resolved yet Patient denies sinus issues,dizziness.      Of note on chart pt had N/V visits over the past month - when pt had GI virus and several days of N/VD he never had a HA at that time.    Patient Active Problem List   Diagnosis Date Noted   Umbilical hernia without obstruction and without gangrene 11/29/2021   Splenomegaly 11/29/2021   Fatty liver 11/29/2021   Pre-diabetes 04/12/2021   Primary hypertension 04/12/2021   COVID-19 virus infection 02/07/2019   Asthma, mild intermittent    Morbid obesity (HCC) 02/08/2008      Current Outpatient Medications:    albuterol (VENTOLIN HFA) 108 (90 Base) MCG/ACT inhaler, Inhale 2 puffs into the lungs every 4 (four) hours as needed for wheezing or shortness of breath., Disp: 18 g, Rfl: 0   azelastine (ASTELIN) 0.1 % nasal spray, Place 2 sprays into both nostrils 2 (two) times daily. Use in each nostril as directed, Disp: 30 mL, Rfl: 2   famotidine (PEPCID) 20 MG tablet, TAKE 1 TABLET BY MOUTH TWICE A DAY, Disp:  60 tablet, Rfl: 0   fluticasone (FLONASE) 50 MCG/ACT nasal spray, Place 2 sprays into both nostrils daily., Disp: 16 g, Rfl: 2   fluticasone furoate-vilanterol (BREO ELLIPTA) 100-25 MCG/ACT AEPB, Inhale 1 puff into the lungs daily., Disp: 1 each, Rfl: 2   metoCLOPramide (REGLAN) 10 MG tablet, Take 1 tablet (10 mg total) by mouth every 6 (six) hours., Disp: 30 tablet, Rfl: 0   omeprazole (PRILOSEC) 40 MG capsule, TAKE 1  CAPSULE (40 MG TOTAL) BY MOUTH IN THE MORNING, Disp: 30 capsule, Rfl: 0   ondansetron (ZOFRAN-ODT) 8 MG disintegrating tablet, Take 1 tablet (8 mg total) by mouth every 8 (eight) hours as needed for nausea or vomiting., Disp: 40 tablet, Rfl: 0   prochlorperazine (COMPAZINE) 10 MG tablet, Take 1 tablet (10 mg total) by mouth every 6 (six) hours as needed for nausea or vomiting., Disp: 30 tablet, Rfl: 0   valsartan-hydrochlorothiazide (DIOVAN HCT) 80-12.5 MG tablet, Take 1 tablet by mouth daily., Disp: 90 tablet, Rfl: 1   amoxicillin-clavulanate (AUGMENTIN) 875-125 MG tablet, Take 1 tablet by mouth 2 (two) times daily. (Patient not taking: Reported on 01/02/2022), Disp: 20 tablet, Rfl: 0   No Known Allergies   Social History   Tobacco Use   Smoking status: Never   Smokeless tobacco: Never  Vaping Use   Vaping Use: Never used  Substance Use Topics   Alcohol use: Yes    Alcohol/week: 1.0 standard drink of alcohol    Types: 1 Cans of beer per week   Drug use: Not Currently      Chart Review Today: I personally reviewed active problem list, medication list, allergies, family history, social history, health maintenance, notes from last encounter, lab results, imaging with the patient/caregiver today.   Review of Systems  Constitutional:  Negative for activity change, appetite change, chills, diaphoresis, fatigue, fever and weight loss.  HENT:  Negative for congestion, dental problem, ear discharge, ear pain, facial swelling, hearing loss, mouth sores, nosebleeds, postnasal drip, rhinorrhea, sinus pressure, sinus pain, sneezing, sore throat, tinnitus, trouble swallowing and voice change.   Eyes:  Negative for blurred vision, photophobia, pain, discharge, redness, itching and visual disturbance.  Respiratory:  Negative for apnea, cough and chest tightness.   Cardiovascular: Negative.  Negative for chest pain, palpitations and leg swelling.  Gastrointestinal:  Positive for nausea and  vomiting. Negative for abdominal distention, abdominal pain, anorexia, blood in stool, constipation and diarrhea.  Genitourinary: Negative.  Negative for decreased urine volume.  Musculoskeletal:  Negative for back pain and neck pain.  Skin: Negative.   Neurological:  Positive for headaches. Negative for dizziness, tingling, tremors, seizures, syncope, facial asymmetry, speech difficulty, weakness, light-headedness, numbness and loss of balance.  Hematological: Negative.  Negative for adenopathy. Does not bruise/bleed easily.  Psychiatric/Behavioral:  Negative for agitation, confusion, decreased concentration and dysphoric mood. The patient does not have insomnia.        Objective:   Vitals:   01/02/22 1314  BP: 126/82  Pulse: (!) 109  Resp: 16  Temp: 98.4 F (36.9 C)  TempSrc: Oral  SpO2: 98%  Weight: (!) 479 lb 6.4 oz (217.5 kg)  Height: 6\' 4"  (1.93 m)    Body mass index is 58.35 kg/m.  Physical Exam Vitals and nursing note reviewed.  Constitutional:      General: He is not in acute distress.    Appearance: Normal appearance. He is well-developed and well-groomed. He is morbidly obese. He is not ill-appearing, toxic-appearing or diaphoretic.  HENT:  Head: Normocephalic and atraumatic.     Jaw: There is normal jaw occlusion. No trismus.     Right Ear: Hearing, tympanic membrane, ear canal and external ear normal.     Left Ear: Hearing, tympanic membrane, ear canal and external ear normal.     Nose: No rhinorrhea.     Right Turbinates: Enlarged and pale.     Left Turbinates: Enlarged and pale.     Right Sinus: No maxillary sinus tenderness or frontal sinus tenderness.     Left Sinus: No maxillary sinus tenderness or frontal sinus tenderness.     Mouth/Throat:     Lips: Pink.     Mouth: Mucous membranes are moist.     Pharynx: Oropharynx is clear. Uvula midline. No pharyngeal swelling, oropharyngeal exudate, posterior oropharyngeal erythema or uvula swelling.      Tonsils: No tonsillar exudate. 0 on the right. 0 on the left.  Eyes:     General: Lids are normal. No allergic shiner, visual field deficit or scleral icterus.       Right eye: No discharge.        Left eye: No discharge.     Extraocular Movements: Extraocular movements intact.     Right eye: Normal extraocular motion and no nystagmus.     Left eye: Normal extraocular motion and no nystagmus.     Conjunctiva/sclera: Conjunctivae normal.     Right eye: Right conjunctiva is not injected. No chemosis, exudate or hemorrhage.    Left eye: Left conjunctiva is not injected. No chemosis, exudate or hemorrhage. Neck:     Meningeal: Brudzinski's sign and Kernig's sign absent.  Cardiovascular:     Rate and Rhythm: Normal rate and regular rhythm.     Pulses: Normal pulses.     Heart sounds: No murmur heard.    No friction rub. No gallop.  Pulmonary:     Effort: Pulmonary effort is normal. No respiratory distress.     Breath sounds: Normal breath sounds.  Abdominal:     General: Bowel sounds are normal. There is no distension.     Palpations: Abdomen is soft.     Tenderness: There is no abdominal tenderness.  Musculoskeletal:     Cervical back: Normal range of motion. No rigidity or tenderness.     Right lower leg: No edema.     Left lower leg: No edema.  Lymphadenopathy:     Cervical: No cervical adenopathy.  Skin:    General: Skin is warm.     Coloration: Skin is not pale.  Neurological:     Mental Status: He is alert. Mental status is at baseline.     Cranial Nerves: No cranial nerve deficit, dysarthria or facial asymmetry.     Sensory: No sensory deficit.     Motor: No weakness.     Gait: Gait normal.     Comments: MENTAL STATUS: AAOx3, memory intact, fund of knowledge appropriate LANG/SPEECH: Naming and repetition intact, fluent, no dysarthria, follows 3-step commands, answers questions appropriately CRANIAL NERVES:   II: Pupils equal and reactive, no RAPD   III, IV, VI: EOM  intact, no gaze preference or deviation, no nystagmus.   V: normal sensation in V1, V2, and V3 segments bilaterally   VII: no asymmetry, no nasolabial fold flattening   VIII: normal hearing to speech   IX, X: normal palatal elevation, no uvular deviation   XI: 5/5 head turn and 5/5 shoulder shrug bilaterally   XII: midline tongue protrusion MOTOR:  5/5  bilateral grip strength 5/5 strength dorsiflexion/plantarflexion b/l SENSORY:  Normal to light touch Romberg absent COORD: Normal finger to nose one instance of dysmetria otherwise normal, and difficulty with  heel to shin walking in line - (body habitus?) no tremor STATION: normal stance, no truncal ataxia GAIT: Normal; patient able to tip-toe, heel-walk.  Psychiatric:        Mood and Affect: Mood normal.        Behavior: Behavior normal. Behavior is cooperative.      Results for orders placed or performed during the hospital encounter of 11/21/21  CBC with Differential  Result Value Ref Range   WBC 7.9 4.0 - 10.5 K/uL   RBC 4.92 4.22 - 5.81 MIL/uL   Hemoglobin 11.2 (L) 13.0 - 17.0 g/dL   HCT 93.8 (L) 10.1 - 75.1 %   MCV 73.8 (L) 80.0 - 100.0 fL   MCH 22.8 (L) 26.0 - 34.0 pg   MCHC 30.9 30.0 - 36.0 g/dL   RDW 02.5 85.2 - 77.8 %   Platelets 256 150 - 400 K/uL   nRBC 0.0 0.0 - 0.2 %   Neutrophils Relative % 68 %   Neutro Abs 5.3 1.7 - 7.7 K/uL   Lymphocytes Relative 23 %   Lymphs Abs 1.8 0.7 - 4.0 K/uL   Monocytes Relative 6 %   Monocytes Absolute 0.5 0.1 - 1.0 K/uL   Eosinophils Relative 2 %   Eosinophils Absolute 0.1 0.0 - 0.5 K/uL   Basophils Relative 0 %   Basophils Absolute 0.0 0.0 - 0.1 K/uL   Immature Granulocytes 1 %   Abs Immature Granulocytes 0.04 0.00 - 0.07 K/uL  Comprehensive metabolic panel  Result Value Ref Range   Sodium 141 135 - 145 mmol/L   Potassium 4.3 3.5 - 5.1 mmol/L   Chloride 108 98 - 111 mmol/L   CO2 26 22 - 32 mmol/L   Glucose, Bld 117 (H) 70 - 99 mg/dL   BUN 18 6 - 20 mg/dL    Creatinine, Ser 2.42 0.61 - 1.24 mg/dL   Calcium 9.3 8.9 - 35.3 mg/dL   Total Protein 7.7 6.5 - 8.1 g/dL   Albumin 3.5 3.5 - 5.0 g/dL   AST 14 (L) 15 - 41 U/L   ALT 11 0 - 44 U/L   Alkaline Phosphatase 65 38 - 126 U/L   Total Bilirubin 0.7 0.3 - 1.2 mg/dL   GFR, Estimated >61 >44 mL/min   Anion gap 7 5 - 15  Lipase, blood  Result Value Ref Range   Lipase 28 11 - 51 U/L  Urinalysis, Routine w reflex microscopic Urine, Clean Catch  Result Value Ref Range   Color, Urine YELLOW (A) YELLOW   APPearance CLEAR (A) CLEAR   Specific Gravity, Urine >1.046 (H) 1.005 - 1.030   pH 6.0 5.0 - 8.0   Glucose, UA NEGATIVE NEGATIVE mg/dL   Hgb urine dipstick NEGATIVE NEGATIVE   Bilirubin Urine NEGATIVE NEGATIVE   Ketones, ur NEGATIVE NEGATIVE mg/dL   Protein, ur NEGATIVE NEGATIVE mg/dL   Nitrite NEGATIVE NEGATIVE   Leukocytes,Ua NEGATIVE NEGATIVE   Orthostatic VS for the past 24 hrs:  BP- Lying Pulse- Lying BP- Sitting Pulse- Sitting BP- Standing at 0 minutes Pulse- Standing at 0 minutes  01/02/22 1400 126/80 102 128/82 110 128/80 132  BP stable but HR increased       Assessment & Plan:   Pt presents for 2-3 days of severe HA following a few months of N/V  ICD-10-CM   1. Intractable headache, unspecified chronicity pattern, unspecified headache type  R51.9 CT HEAD WO CONTRAST (5MM)    COMPLETE METABOLIC PANEL WITH GFR    CBC with Differential/Platelet    TSH    Hemoglobin A1c    CANCELED: CT HEAD WO CONTRAST (5MM)   frontal, severe x 2-3 d, slightly improving    2. Nausea and vomiting, unspecified vomiting type  R11.2 pantoprazole (PROTONIX) 40 MG tablet    CT HEAD WO CONTRAST (5MM)    COMPLETE METABOLIC PANEL WITH GFR    CBC with Differential/Platelet    TSH    Hemoglobin A1c    Orthostatic vital signs    CANCELED: CT HEAD WO CONTRAST (5MM)   ongoing for weeks, trial protonix BID in place of omeprazole    3. Tachycardia  R00.0 COMPLETE METABOLIC PANEL WITH GFR    CBC with  Differential/Platelet    TSH    Orthostatic vital signs   possibly secondary to pain? dehydration, no concerning cardiac sx, HR was in normal range during time of exam      Discussed plan and stat CT with patient, however he declined to do stat imaging due to slightly improved HA He had non-focal neuro exam today  - can do routine CT head if his sx continue - concerned that he has N/V for so long w/o improvement despite multiple meds and GI consult  ER precautions reviewed thoroughly  Pt tachy today - he appears well hydrated - though pulse jumped up with orthostatics - tachycardia may also be related to pain? Labs for hydration status ordered, TSH as well Denies palpitations, sweats, CP, SOB, orthopnea, LE edema   F/up in 2 weeks if sx not improved  Wrote out doses and combos of his meds that may help with his HA  Encouraged to avoid daily and repeated OTC meds [facility]  Danelle BerryLeisa Lennart Gladish, PA-C 01/02/22 1:39 PM

## 2022-01-03 ENCOUNTER — Telehealth: Payer: Self-pay

## 2022-01-03 LAB — COMPLETE METABOLIC PANEL WITH GFR
AG Ratio: 1.1 (calc) (ref 1.0–2.5)
ALT: 9 U/L (ref 9–46)
AST: 12 U/L (ref 10–40)
Albumin: 3.8 g/dL (ref 3.6–5.1)
Alkaline phosphatase (APISO): 75 U/L (ref 36–130)
BUN: 16 mg/dL (ref 7–25)
CO2: 29 mmol/L (ref 20–32)
Calcium: 9.6 mg/dL (ref 8.6–10.3)
Chloride: 103 mmol/L (ref 98–110)
Creat: 0.92 mg/dL (ref 0.60–1.24)
Globulin: 3.5 g/dL (calc) (ref 1.9–3.7)
Glucose, Bld: 91 mg/dL (ref 65–99)
Potassium: 4.3 mmol/L (ref 3.5–5.3)
Sodium: 140 mmol/L (ref 135–146)
Total Bilirubin: 0.5 mg/dL (ref 0.2–1.2)
Total Protein: 7.3 g/dL (ref 6.1–8.1)
eGFR: 115 mL/min/{1.73_m2} (ref >=60)

## 2022-01-03 LAB — CBC WITH DIFFERENTIAL/PLATELET
Absolute Monocytes: 570 cells/uL (ref 200–950)
Basophils Absolute: 30 cells/uL (ref 0–200)
Basophils Relative: 0.3 %
Eosinophils Absolute: 120 cells/uL (ref 15–500)
Eosinophils Relative: 1.2 %
HCT: 35 % — ABNORMAL LOW (ref 38.5–50.0)
Hemoglobin: 11.6 g/dL — ABNORMAL LOW (ref 13.2–17.1)
Lymphs Abs: 2050 cells/uL (ref 850–3900)
MCH: 23.6 pg — ABNORMAL LOW (ref 27.0–33.0)
MCHC: 33.1 g/dL (ref 32.0–36.0)
MCV: 71.1 fL — ABNORMAL LOW (ref 80.0–100.0)
MPV: 11.3 fL (ref 7.5–12.5)
Monocytes Relative: 5.7 %
Neutro Abs: 7230 cells/uL (ref 1500–7800)
Neutrophils Relative %: 72.3 %
Platelets: 300 10*3/uL (ref 140–400)
RBC: 4.92 10*6/uL (ref 4.20–5.80)
RDW: 15 % (ref 11.0–15.0)
Total Lymphocyte: 20.5 %
WBC: 10 10*3/uL (ref 3.8–10.8)

## 2022-01-03 LAB — HEMOGLOBIN A1C
Hgb A1c MFr Bld: 5.8 % of total Hgb — ABNORMAL HIGH (ref <5.7)
Mean Plasma Glucose: 120 mg/dL
eAG (mmol/L): 6.6 mmol/L

## 2022-01-03 LAB — TSH: TSH: 1.12 mIU/L (ref 0.40–4.50)

## 2022-01-03 NOTE — Telephone Encounter (Signed)
PT returned our call. Shared provider's note with pt.  Made follow up appointment with Dr. Carlynn Purl for 01/23/2022  Danelle Berry, PA-C  01/03/2022 12:48 PM EDT     Labs show stable mild anemia He did not have an elevated white count (which would concern me for infection if it was high) His thyroid was normal (no hyperthyroid) A1C is stable 5.8 prediabetes range Normal glucose, electrolytes, kidney function    I am worried that his heart rate stayed high during visit and jumped up with orthostatics I would want him to come back for just a recheck in 2 weeks with me or PCP - to recheck N/V, head ache and tachycardia    If he has any CP SOB worse HA or worse N/V he needs to present to the ER for evaluation If he has any abnormal neuro symptoms - like numbness tingling, weakness, slurred speech, change in vision, facial droop, abnormal eye movements or pupil differences - he also needs to go to the ER or get the head CT completed

## 2022-01-03 NOTE — Addendum Note (Signed)
Addended by: Danelle Berry on: 01/03/2022 01:38 PM   Modules accepted: Orders

## 2022-01-03 NOTE — Progress Notes (Signed)
Discussed OV/presentation and results with Dr. Carlynn Purl - she pointed out recent head CT which I did not see and pt did not mention, Dr. Carlynn Purl recommends neuro consult. Referral put in and CT cancelled - pt will be updated  Danelle Berry, PA-C

## 2022-01-07 ENCOUNTER — Ambulatory Visit: Payer: Federal, State, Local not specified - PPO

## 2022-01-09 NOTE — Addendum Note (Signed)
Addended by: Danelle Berry on: 01/09/2022 06:14 PM   Modules accepted: Orders

## 2022-01-23 ENCOUNTER — Ambulatory Visit: Payer: Federal, State, Local not specified - PPO | Admitting: Family Medicine

## 2022-02-18 DIAGNOSIS — R1013 Epigastric pain: Secondary | ICD-10-CM | POA: Diagnosis not present

## 2022-02-18 DIAGNOSIS — D649 Anemia, unspecified: Secondary | ICD-10-CM | POA: Diagnosis not present

## 2022-02-18 DIAGNOSIS — Z87898 Personal history of other specified conditions: Secondary | ICD-10-CM | POA: Diagnosis not present

## 2022-03-03 ENCOUNTER — Ambulatory Visit: Payer: Federal, State, Local not specified - PPO | Admitting: Family Medicine

## 2022-03-27 ENCOUNTER — Ambulatory Visit: Payer: Federal, State, Local not specified - PPO | Admitting: Gastroenterology

## 2022-05-23 ENCOUNTER — Encounter: Payer: Self-pay | Admitting: *Deleted

## 2022-05-23 ENCOUNTER — Ambulatory Visit: Payer: Federal, State, Local not specified - PPO | Admitting: Registered Nurse

## 2022-05-23 ENCOUNTER — Encounter
Admission: RE | Disposition: A | Discharge status: Home / Self Care | Payer: Self-pay | Source: Home / Self Care | Attending: Gastroenterology

## 2022-05-23 ENCOUNTER — Ambulatory Visit
Admission: RE | Admit: 2022-05-23 | Discharge: 2022-05-23 | Disposition: A | Discharge status: Home / Self Care | Payer: Federal, State, Local not specified - PPO | Attending: Gastroenterology | Admitting: Gastroenterology

## 2022-05-23 DIAGNOSIS — K2970 Gastritis, unspecified, without bleeding: Secondary | ICD-10-CM | POA: Insufficient documentation

## 2022-05-23 DIAGNOSIS — K5909 Other constipation: Secondary | ICD-10-CM | POA: Diagnosis not present

## 2022-05-23 DIAGNOSIS — K621 Rectal polyp: Secondary | ICD-10-CM | POA: Insufficient documentation

## 2022-05-23 DIAGNOSIS — I10 Essential (primary) hypertension: Secondary | ICD-10-CM | POA: Insufficient documentation

## 2022-05-23 DIAGNOSIS — K649 Unspecified hemorrhoids: Secondary | ICD-10-CM | POA: Diagnosis not present

## 2022-05-23 DIAGNOSIS — K2950 Unspecified chronic gastritis without bleeding: Secondary | ICD-10-CM | POA: Diagnosis not present

## 2022-05-23 DIAGNOSIS — D509 Iron deficiency anemia, unspecified: Secondary | ICD-10-CM | POA: Insufficient documentation

## 2022-05-23 DIAGNOSIS — K640 First degree hemorrhoids: Secondary | ICD-10-CM | POA: Diagnosis not present

## 2022-05-23 DIAGNOSIS — Z6841 Body Mass Index (BMI) 40.0 and over, adult: Secondary | ICD-10-CM | POA: Insufficient documentation

## 2022-05-23 DIAGNOSIS — E669 Obesity, unspecified: Secondary | ICD-10-CM | POA: Insufficient documentation

## 2022-05-23 DIAGNOSIS — K635 Polyp of colon: Secondary | ICD-10-CM | POA: Diagnosis not present

## 2022-05-23 DIAGNOSIS — J4520 Mild intermittent asthma, uncomplicated: Secondary | ICD-10-CM | POA: Diagnosis not present

## 2022-05-23 DIAGNOSIS — R112 Nausea with vomiting, unspecified: Secondary | ICD-10-CM | POA: Insufficient documentation

## 2022-05-23 DIAGNOSIS — K2930 Chronic superficial gastritis without bleeding: Secondary | ICD-10-CM | POA: Diagnosis not present

## 2022-05-23 HISTORY — PX: ESOPHAGOGASTRODUODENOSCOPY (EGD) WITH PROPOFOL: SHX5813

## 2022-05-23 HISTORY — DX: Essential (primary) hypertension: I10

## 2022-05-23 HISTORY — PX: COLONOSCOPY WITH PROPOFOL: SHX5780

## 2022-05-23 SURGERY — ESOPHAGOGASTRODUODENOSCOPY (EGD) WITH PROPOFOL
Anesthesia: General

## 2022-05-23 MED ORDER — PHENYLEPHRINE 80 MCG/ML (10ML) SYRINGE FOR IV PUSH (FOR BLOOD PRESSURE SUPPORT)
PREFILLED_SYRINGE | INTRAVENOUS | Status: AC
  Filled 2022-05-23: qty 20

## 2022-05-23 MED ORDER — DEXMEDETOMIDINE HCL 200 MCG/2ML IV SOLN
INTRAVENOUS | Status: DC | PRN
  Administered 2022-05-23: 20 ug via INTRAVENOUS

## 2022-05-23 MED ORDER — LIDOCAINE HCL (CARDIAC) PF 100 MG/5ML IV SOSY
PREFILLED_SYRINGE | INTRAVENOUS | Status: DC | PRN
  Administered 2022-05-23: 100 mg via INTRAVENOUS

## 2022-05-23 MED ORDER — KETAMINE HCL 10 MG/ML IJ SOLN
INTRAMUSCULAR | Status: DC | PRN
  Administered 2022-05-23: 50 mg via INTRAVENOUS

## 2022-05-23 MED ORDER — PHENYLEPHRINE HCL (PRESSORS) 10 MG/ML IV SOLN
INTRAVENOUS | Status: DC | PRN
  Administered 2022-05-23 (×4): 160 ug via INTRAVENOUS

## 2022-05-23 MED ORDER — LIDOCAINE HCL (PF) 2 % IJ SOLN
INTRAMUSCULAR | Status: AC
  Filled 2022-05-23: qty 5

## 2022-05-23 MED ORDER — PROPOFOL 500 MG/50ML IV EMUL
INTRAVENOUS | Status: DC | PRN
  Administered 2022-05-23: 150 ug/kg/min via INTRAVENOUS

## 2022-05-23 MED ORDER — PROPOFOL 10 MG/ML IV BOLUS
INTRAVENOUS | Status: DC | PRN
  Administered 2022-05-23: 140 mg via INTRAVENOUS

## 2022-05-23 MED ORDER — GLYCOPYRROLATE 0.2 MG/ML IJ SOLN
INTRAMUSCULAR | Status: DC | PRN
  Administered 2022-05-23: .2 mg via INTRAVENOUS

## 2022-05-23 MED ORDER — EPHEDRINE SULFATE (PRESSORS) 50 MG/ML IJ SOLN
INTRAMUSCULAR | Status: DC | PRN
  Administered 2022-05-23: 5 mg via INTRAVENOUS

## 2022-05-23 MED ORDER — EPHEDRINE 5 MG/ML INJ
INTRAVENOUS | Status: AC
  Filled 2022-05-23: qty 5

## 2022-05-23 MED ORDER — KETAMINE HCL 50 MG/5ML IJ SOSY
PREFILLED_SYRINGE | INTRAMUSCULAR | Status: AC
  Filled 2022-05-23: qty 5

## 2022-05-23 MED ORDER — GLYCOPYRROLATE 0.2 MG/ML IJ SOLN
INTRAMUSCULAR | Status: AC
  Filled 2022-05-23: qty 1

## 2022-05-23 MED ORDER — SODIUM CHLORIDE 0.9 % IV SOLN: INTRAVENOUS | Status: DC

## 2022-05-23 NOTE — Op Note (Signed)
[facility] Patient Name: [name] Procedure Date: 05/23/2022 9:01 AM MRN: [id number] Account #: [id number] Date of Birth: 1990/12/26 Admit Type: Outpatient Age: 31 Room: [facility] ENDO ROOM 3 Gender: Male Note Status: Finalized Instrument Name: Park Meo [id number] Procedure:             Colonoscopy Indications:           Iron deficiency anemia Providers:             Andrey Farmer MD, MD Medicines:             Monitored Anesthesia Care Complications:         No immediate complications. Estimated blood loss:                         Minimal. Procedure:             Pre-Anesthesia Assessment:                        - Prior to the procedure, a History and Physical was                         performed, and patient medications and allergies were                         reviewed. The patient is competent. The risks and                         benefits of the procedure and the sedation options and                         risks were discussed with the patient. All questions                         were answered and informed consent was obtained.                         Patient identification and proposed procedure were                         verified by the physician, the nurse, the                         anesthesiologist, the anesthetist and the technician                         in the endoscopy suite. Mental Status Examination:                         alert and oriented. Airway Examination: normal                         oropharyngeal airway and neck mobility. Respiratory                         Examination: clear to auscultation. CV Examination:                         normal. Prophylactic Antibiotics: The patient does not  require prophylactic antibiotics. Prior                         Anticoagulants: The patient has taken no anticoagulant                         or antiplatelet agents. ASA Grade Assessment: III - A                          patient with severe systemic disease. After reviewing                         the risks and benefits, the patient was deemed in                         satisfactory condition to undergo the procedure. The                         anesthesia plan was to use monitored anesthesia care                         (MAC). Immediately prior to administration of                         medications, the patient was re-assessed for adequacy                         to receive sedatives. The heart rate, respiratory                         rate, oxygen saturations, blood pressure, adequacy of                         pulmonary ventilation, and response to care were                         monitored throughout the procedure. The physical                         status of the patient was re-assessed after the                         procedure.                        After obtaining informed consent, the colonoscope was                         passed under direct vision. Throughout the procedure,                         the patient's blood pressure, pulse, and oxygen                         saturations were monitored continuously. The                         Colonoscope was introduced through the anus and  advanced to the the terminal ileum. The colonoscopy                         was somewhat difficult due to significant looping.                         Successful completion of the procedure was aided by                         applying abdominal pressure. The patient tolerated the                         procedure well. The quality of the bowel preparation                         was good. The terminal ileum, ileocecal valve,                         appendiceal orifice, and rectum were photographed. Findings:      The perianal and digital rectal examinations were normal.      The terminal ileum appeared normal.      A 3 mm polyp was found in the rectum. The polyp was  hyperplastic. The       polyp was removed with a cold snare. Resection and retrieval were       complete. Estimated blood loss was minimal.      Internal hemorrhoids were found during retroflexion. The hemorrhoids       were Grade I (internal hemorrhoids that do not prolapse).      The exam was otherwise without abnormality on direct and retroflexion       views. Impression:            - The examined portion of the ileum was normal.                        - One 3 mm polyp in the rectum, removed with a cold                         snare. Resected and retrieved.                        - Internal hemorrhoids.                        - The examination was otherwise normal on direct and                         retroflexion views. Recommendation:        - Discharge patient to home.                        - Resume previous diet.                        - Continue present medications.                        - Await pathology results.                        -  Repeat colonoscopy for surveillance based on                         pathology results.                        - Return to referring physician as previously                         scheduled. Procedure Code(s):     --- Professional ---                        [phone], Colonoscopy, flexible; with removal of                         tumor(s), polyp(s), or other lesion(s) by snare                         technique Diagnosis Code(s):     --- Professional ---                        K64.0, First degree hemorrhoids                        D12.8, Benign neoplasm of rectum                        D50.9, Iron deficiency anemia, unspecified CPT copyright 2022 American Medical Association. All rights reserved. The codes documented in this report are preliminary and upon coder review may  be revised to meet current compliance requirements. Andrey Farmer MD, MD 05/23/2022 10:26:11 AM Number of Addenda: 0 Note Initiated On: 05/23/2022 9:01 AM Scope  Withdrawal Time: 0 hours 7 minutes 45 seconds  Total Procedure Duration: 0 hours 15 minutes 28 seconds  Estimated Blood Loss:  Estimated blood loss was minimal.      [facility]

## 2022-05-23 NOTE — Anesthesia Preprocedure Evaluation (Signed)
Anesthesia Evaluation  Patient identified by MRN, date of birth, ID band Patient awake    Reviewed: Allergy & Precautions, NPO status , Patient's Chart, lab work & pertinent test results  Airway Mallampati: II  TM Distance: >3 FB Neck ROM: full    Dental  (+) Teeth Intact   Pulmonary neg pulmonary ROS, asthma    Pulmonary exam normal  + decreased breath sounds      Cardiovascular Exercise Tolerance: Good hypertension, Pt. on medications negative cardio ROS Normal cardiovascular exam Rhythm:Regular Rate:Normal     Neuro/Psych negative neurological ROS  negative psych ROS   GI/Hepatic negative GI ROS, Neg liver ROS,,,  Endo/Other  negative endocrine ROS  Morbid obesity  Renal/GU negative Renal ROS  negative genitourinary   Musculoskeletal   Abdominal  (+) + obese  Peds negative pediatric ROS (+)  Hematology negative hematology ROS (+)   Anesthesia Other Findings Past Medical History: No date: Asthma, mild intermittent No date: Chronic constipation No date: Morbid obesity (HCC)  History reviewed. No pertinent surgical history.  BMI    Body Mass Index: 57.21 kg/m      Reproductive/Obstetrics negative OB ROS                             Anesthesia Physical Anesthesia Plan  ASA: 3  Anesthesia Plan: General   Post-op Pain Management:    Induction: Intravenous  PONV Risk Score and Plan: Propofol infusion and TIVA  Airway Management Planned: Natural Airway  Additional Equipment:   Intra-op Plan:   Post-operative Plan:   Informed Consent: I have reviewed the patients History and Physical, chart, labs and discussed the procedure including the risks, benefits and alternatives for the proposed anesthesia with the patient or authorized representative who has indicated his/her understanding and acceptance.     Dental Advisory Given  Plan Discussed with: CRNA and  Surgeon  Anesthesia Plan Comments:        Anesthesia Quick Evaluation

## 2022-05-23 NOTE — Transfer of Care (Signed)
Immediate Anesthesia Transfer of Care Note  Patient: Emigdio Wildeman  Procedure(s) Performed: ESOPHAGOGASTRODUODENOSCOPY (EGD) WITH PROPOFOL COLONOSCOPY WITH PROPOFOL  Patient Location: Endoscopy Unit  Anesthesia Type:General  Level of Consciousness: drowsy  Airway & Oxygen Therapy: Patient Spontanous Breathing  Post-op Assessment: Report given to RN and Post -op Vital signs reviewed and stable  Post vital signs: Reviewed and stable  Last Vitals:  Vitals Value Taken Time  BP 99/54 05/23/22 1024  Temp    Pulse 81 05/23/22 1024  Resp 18 05/23/22 1024  SpO2 97 % 05/23/22 1024  Vitals shown include unvalidated device data.  Last Pain:  Vitals:   05/23/22 0902  TempSrc: Temporal  PainSc: 0-No pain         Complications: No notable events documented.

## 2022-05-23 NOTE — Op Note (Signed)
[facility] Gastroenterology Patient Name: [name] Procedure Date: 05/23/2022 9:01 AM MRN: [id number] Account #: [id number] Date of Birth: April 02, 1991 Admit Type: Outpatient Age: 31 Room: [facility] ENDO ROOM 3 Gender: Male Note Status: Finalized Instrument Name: Altamese Cabal Endoscope [id number] Procedure:             Upper GI endoscopy Indications:           Nausea with vomiting Providers:             Andrey Farmer MD, MD Medicines:             Monitored Anesthesia Care Complications:         No immediate complications. Estimated blood loss:                         Minimal. Procedure:             Pre-Anesthesia Assessment:                        - Prior to the procedure, a History and Physical was                         performed, and patient medications and allergies were                         reviewed. The patient is competent. The risks and                         benefits of the procedure and the sedation options and                         risks were discussed with the patient. All questions                         were answered and informed consent was obtained.                         Patient identification and proposed procedure were                         verified by the physician, the nurse, the                         anesthesiologist, the anesthetist and the technician                         in the endoscopy suite. Mental Status Examination:                         alert and oriented. Airway Examination: normal                         oropharyngeal airway and neck mobility. Respiratory                         Examination: clear to auscultation. CV Examination:                         normal. Prophylactic Antibiotics: The patient does not  require prophylactic antibiotics. Prior                         Anticoagulants: The patient has taken no anticoagulant                         or antiplatelet agents. ASA Grade Assessment: III -  A                         patient with severe systemic disease. After reviewing                         the risks and benefits, the patient was deemed in                         satisfactory condition to undergo the procedure. The                         anesthesia plan was to use monitored anesthesia care                         (MAC). Immediately prior to administration of                         medications, the patient was re-assessed for adequacy                         to receive sedatives. The heart rate, respiratory                         rate, oxygen saturations, blood pressure, adequacy of                         pulmonary ventilation, and response to care were                         monitored throughout the procedure. The physical                         status of the patient was re-assessed after the                         procedure.                        After obtaining informed consent, the endoscope was                         passed under direct vision. Throughout the procedure,                         the patient's blood pressure, pulse, and oxygen                         saturations were monitored continuously. The Endoscope                         was introduced through the mouth, and advanced to the  second part of duodenum. The upper GI endoscopy was                         accomplished without difficulty. The patient tolerated                         the procedure well. Findings:      The examined esophagus was normal.      Patchy mild inflammation characterized by erythema was found in the       gastric antrum. Biopsies were taken with a cold forceps for Helicobacter       pylori testing. Estimated blood loss was minimal.      The exam of the stomach was otherwise normal.      The examined duodenum was normal. Impression:            - Normal esophagus.                        - Gastritis. Biopsied.                        - Normal examined  duodenum. Recommendation:        - Await pathology results.                        - Perform a colonoscopy today. Procedure Code(s):     --- Professional ---                        [phone], Esophagogastroduodenoscopy, flexible,                         transoral; with biopsy, single or multiple Diagnosis Code(s):     --- Professional ---                        K29.70, Gastritis, unspecified, without bleeding                        R11.2, Nausea with vomiting, unspecified CPT copyright 2022 American Medical Association. All rights reserved. The codes documented in this report are preliminary and upon coder review may  be revised to meet current compliance requirements. Andrey Farmer MD, MD 05/23/2022 10:23:15 AM Number of Addenda: 0 Note Initiated On: 05/23/2022 9:01 AM Estimated Blood Loss:  Estimated blood loss was minimal.      [facility]

## 2022-05-23 NOTE — Interval H&P Note (Signed)
History and Physical Interval Note:  05/23/2022 9:26 AM  [name]  has presented today for surgery, with the diagnosis of Dyspepsia,IDA.  The various methods of treatment have been discussed with the patient and family. After consideration of risks, benefits and other options for treatment, the patient has consented to  Procedure(s): ESOPHAGOGASTRODUODENOSCOPY (EGD) WITH PROPOFOL (N/A) COLONOSCOPY WITH PROPOFOL (N/A) as a surgical intervention.  The patient's history has been reviewed, patient examined, no change in status, stable for surgery.  I have reviewed the patient's chart and labs.  Questions were answered to the patient's satisfaction.     Regis Bill  Ok to proceed with EGD/Colonoscopy

## 2022-05-23 NOTE — H&P (Signed)
Outpatient short stay form Pre-procedure 05/23/2022  Glen Bill, MD  Primary Physician: Alba Cory, MD  Reason for visit:  Nausea/vomiting/IDA  History of present illness:    31 y/o gentleman with nausea/vomiting/IDA here for EGD/Colonoscopy. Never had these procedures before. No blood thinners. No family history of GI malignancies. No abdominal surgeries. His symptoms are better off semaglutide.    Current Facility-Administered Medications:    0.9 %  sodium chloride infusion, , Intravenous, Continuous, Darnelle Derrick, Rossie Muskrat, MD  Medications Prior to Admission  Medication Sig Dispense Refill Last Dose   valsartan-hydrochlorothiazide (DIOVAN HCT) 80-12.5 MG tablet Take 1 tablet by mouth daily. 90 tablet 1 05/23/2022 at 0730   albuterol (VENTOLIN HFA) 108 (90 Base) MCG/ACT inhaler Inhale 2 puffs into the lungs every 4 (four) hours as needed for wheezing or shortness of breath. 18 g 0    amoxicillin-clavulanate (AUGMENTIN) 875-125 MG tablet Take 1 tablet by mouth 2 (two) times daily. (Patient not taking: Reported on 01/02/2022) 20 tablet 0    azelastine (ASTELIN) 0.1 % nasal spray Place 2 sprays into both nostrils 2 (two) times daily. Use in each nostril as directed 30 mL 2    famotidine (PEPCID) 20 MG tablet TAKE 1 TABLET BY MOUTH TWICE A DAY 60 tablet 0    fluticasone (FLONASE) 50 MCG/ACT nasal spray Place 2 sprays into both nostrils daily. 16 g 2    fluticasone furoate-vilanterol (BREO ELLIPTA) 100-25 MCG/ACT AEPB Inhale 1 puff into the lungs daily. 1 each 2    metoCLOPramide (REGLAN) 10 MG tablet Take 1 tablet (10 mg total) by mouth every 6 (six) hours. 30 tablet 0    ondansetron (ZOFRAN-ODT) 8 MG disintegrating tablet Take 1 tablet (8 mg total) by mouth every 8 (eight) hours as needed for nausea or vomiting. 40 tablet 0    pantoprazole (PROTONIX) 40 MG tablet Take 1 tablet (40 mg total) by mouth daily. 60 tablet 3    prochlorperazine (COMPAZINE) 10 MG tablet Take 1 tablet (10  mg total) by mouth every 6 (six) hours as needed for nausea or vomiting. 30 tablet 0      No Known Allergies   Past Medical History:  Diagnosis Date   Asthma, mild intermittent    Chronic constipation    Hypertension    Morbid obesity (HCC)     Review of systems:  Otherwise negative.    Physical Exam  Gen: Alert, oriented. Appears stated age.  HEENT: PERRLA. Lungs: No respiratory distress CV: RRR Abd: soft, benign, no masses Ext: No edema    Planned procedures: Proceed with EGD/colonoscopy. The patient understands the nature of the planned procedure, indications, risks, alternatives and potential complications including but not limited to bleeding, infection, perforation, damage to internal organs and possible oversedation/side effects from anesthesia. The patient agrees and gives consent to proceed.  Please refer to procedure notes for findings, recommendations and patient disposition/instructions.     Glen Bill, MD [facility] Gastroenterology

## 2022-05-23 NOTE — Anesthesia Postprocedure Evaluation (Signed)
Anesthesia Post Note  Patient: [name]  Procedure(s) Performed: ESOPHAGOGASTRODUODENOSCOPY (EGD) WITH PROPOFOL COLONOSCOPY WITH PROPOFOL  Patient location during evaluation: PACU Anesthesia Type: General Level of consciousness: awake Pain management: pain level controlled Vital Signs Assessment: post-procedure vital signs reviewed and stable Respiratory status: spontaneous breathing Cardiovascular status: stable Anesthetic complications: no  No notable events documented.   Last Vitals:  Vitals:   05/23/22 1036 05/23/22 1043  BP: (!) 91/49 120/60  Pulse: (!) 101 93  Resp:  (!) 45  Temp:    SpO2: 100% 93%    Last Pain:  Vitals:   05/23/22 1043  TempSrc:   PainSc: 0-No pain                 VAN STAVEREN,Alycea Segoviano

## 2022-05-24 ENCOUNTER — Encounter: Payer: Self-pay | Admitting: Gastroenterology

## 2022-05-27 LAB — SURGICAL PATHOLOGY

## 2022-06-02 NOTE — Progress Notes (Unsigned)
Name: [name]   MRN: [id number]    DOB: Dec 12, 1990   Date:06/03/2022       Progress Note  Subjective  Chief Complaint  Follow Up  HPI  Asthma intermittent : currently under control, he is doing well at this time, denies no  cough, wheezing or sob, only Breo for flares   Morbid obesity:  His weight at age 32 was 312 lbs.He was taking Metformin and we added Rybelsus in the Fall 22 but off medication due to nausea. Weight since he stopped medication is trending up and today is 498 lbs , discussed referral to bariatric center   HTN: currently on diovan hctz 80/12.5 , no side effects of medication, bp has been at goal, no chest pain or palpitation , he has SOB with activity but could be secondary to obesity   Pre-diabetes: last A1C 5.8 and we will recheck level  He denies polyphagia, polydipsia or polyuria , no longer taking Metformin or Rybelsus due to nausea and vomiting.   Daily nausea: symptoms started early June, we stopped Metformin and Rybelsus but symptoms did not resolve, he has been to [facility] twice, had CT brain and Abdomen that showed umbilical hernia, fatty liver and splenomegaly, and also very large liver and possible cirrhosis, EGD showed mild gastritis, feeling better now, taking PPI daily off pepcid   Patient Active Problem List   Diagnosis Date Noted   Umbilical hernia without obstruction and without gangrene 11/29/2021   Splenomegaly 11/29/2021   Fatty liver 11/29/2021   Pre-diabetes 04/12/2021   Primary hypertension 04/12/2021   COVID-19 virus infection 02/07/2019   Asthma, mild intermittent    Morbid obesity (HCC) 02/08/2008    Past Surgical History:  Procedure Laterality Date   COLONOSCOPY WITH PROPOFOL N/A 05/23/2022   Procedure: COLONOSCOPY WITH PROPOFOL;  Surgeon: Regis Bill, MD;  Location: [facility] ENDOSCOPY;  Service: Endoscopy;  Laterality: N/A;   ESOPHAGOGASTRODUODENOSCOPY (EGD) WITH PROPOFOL N/A 05/23/2022   Procedure: ESOPHAGOGASTRODUODENOSCOPY  (EGD) WITH PROPOFOL;  Surgeon: Regis Bill, MD;  Location: [facility] ENDOSCOPY;  Service: Endoscopy;  Laterality: N/A;    Family History  Problem Relation Age of Onset   Obesity Mother    Kidney disease Father        mass   Liver cancer Father    Obesity Sister     Social History   Tobacco Use   Smoking status: Never   Smokeless tobacco: Never  Substance Use Topics   Alcohol use: Yes    Alcohol/week: 1.0 standard drink of alcohol    Types: 1 Cans of beer per week     Current Outpatient Medications:    albuterol (VENTOLIN HFA) 108 (90 Base) MCG/ACT inhaler, Inhale 2 puffs into the lungs every 4 (four) hours as needed for wheezing or shortness of breath., Disp: 18 g, Rfl: 0   azelastine (ASTELIN) 0.1 % nasal spray, Place 2 sprays into both nostrils 2 (two) times daily. Use in each nostril as directed, Disp: 30 mL, Rfl: 2   famotidine (PEPCID) 20 MG tablet, TAKE 1 TABLET BY MOUTH TWICE A DAY, Disp: 60 tablet, Rfl: 0   fluticasone (FLONASE) 50 MCG/ACT nasal spray, Place 2 sprays into both nostrils daily., Disp: 16 g, Rfl: 2   metoCLOPramide (REGLAN) 10 MG tablet, Take 1 tablet (10 mg total) by mouth every 6 (six) hours., Disp: 30 tablet, Rfl: 0   ondansetron (ZOFRAN-ODT) 8 MG disintegrating tablet, Take 1 tablet (8 mg total) by mouth every 8 (eight) hours as  needed for nausea or vomiting., Disp: 40 tablet, Rfl: 0   pantoprazole (PROTONIX) 40 MG tablet, Take 1 tablet (40 mg total) by mouth daily., Disp: 60 tablet, Rfl: 3   prochlorperazine (COMPAZINE) 10 MG tablet, Take 1 tablet (10 mg total) by mouth every 6 (six) hours as needed for nausea or vomiting., Disp: 30 tablet, Rfl: 0   valsartan-hydrochlorothiazide (DIOVAN HCT) 80-12.5 MG tablet, Take 1 tablet by mouth daily., Disp: 90 tablet, Rfl: 1   amoxicillin-clavulanate (AUGMENTIN) 875-125 MG tablet, Take 1 tablet by mouth 2 (two) times daily. (Patient not taking: Reported on 01/02/2022), Disp: 20 tablet, Rfl: 0   fluticasone  furoate-vilanterol (BREO ELLIPTA) 100-25 MCG/ACT AEPB, Inhale 1 puff into the lungs daily. (Patient not taking: Reported on 06/03/2022), Disp: 1 each, Rfl: 2  No Known Allergies  I personally reviewed active problem list, medication list, allergies, family history, social history, health maintenance with the patient/caregiver today.   ROS  Constitutional: Negative for fever , positive for  weight change.  Respiratory: Negative for cough and shortness of breath.   Cardiovascular: Negative for chest pain or palpitations.  Gastrointestinal: Negative for abdominal pain, no bowel changes.  Musculoskeletal: Negative for gait problem or joint swelling.  Skin: Negative for rash.  Neurological: Negative for dizziness or headache.  No other specific complaints in a complete review of systems (except as listed in HPI above).   Objective  Vitals:   06/03/22 1336  BP: 122/80  Pulse: (!) 102  Resp: 16  SpO2: 97%  Weight: (!) 498 lb (225.9 kg)  Height: 6\' 4"  (1.93 m)    Body mass index is 60.62 kg/m.  Physical Exam  Constitutional: Patient appears well-developed and well-nourished. Obese  No distress.  HEENT: head atraumatic, normocephalic, pupils equal and reactive to light, neck supple Cardiovascular: Normal rate, regular rhythm and normal heart sounds.  No murmur heard. No BLE edema. Pulmonary/Chest: Effort normal and breath sounds normal. No respiratory distress. Abdominal: Soft.  There is no tenderness. Psychiatric: Patient has a normal mood and affect. behavior is normal. Judgment and thought content normal.   Recent Results (from the past 2160 hour(s))  Surgical pathology     Status: None   Collection Time: 05/23/22  9:06 AM  Result Value Ref Range   SURGICAL PATHOLOGY      SURGICAL PATHOLOGY CASE: [id number] PATIENT: [name] Surgical Pathology Report     Specimen Submitted: A. Stomach; cbx B. Rectum polyp; cold snare  Clinical History: Dyspepsia, IDA.   Gastritis, colon polyp hemorrhoids      DIAGNOSIS: A.  STOMACH; COLD BIOPSY: - FRAGMENTS OF GASTRIC OXYNTIC AND ANTRAL TYPE MUCOSA WITH MINIMAL CHRONIC INFLAMMATION. - NEGATIVE FOR SIGNIFICANT ACUTE INFLAMMATION, HELICOBACTER PYLORI ORGANISMS, GRANULOMA AND MALIGNANCY.  RECTUM, "POLYP"; COLD SNARE: - FRAGMENTS OF HYPERPLASTIC POLYP. - NEGATIVE FOR DYSPLASIA AND MALIGNANCY.   GROSS DESCRIPTION: A. Labeled: Cbx stomach for H. pylori Received: Formalin Collection time: 9:06 AM on 05/23/2022 Placed into formalin time: 9:06 AM on 05/23/2022 Tissue fragment(s): Multiple Size: Aggregate, 1.5 x 0.2 x 0.1 cm Description: Tan soft tissue fragments Entirely submitted in 1 cassette.  B. Labeled: Cold snared rectal polyp Received: Formalin Collection time: 10:13 AM on  05/23/2022 Placed into formalin time: 10:13 AM on 05/23/2022 Tissue fragment(s): 1 Size: 1.4 x 0.5 x 0.1 cm Description: Tan to pink soft tissue fragment Entirely submitted in 1 cassette.  CM 05/23/2022  Final Diagnosis performed by Tomasa Blase, MD.   Electronically signed 05/27/2022 9:51:22AM The electronic signature indicates  that the named Attending Pathologist has evaluated the specimen Technical component performed at [facility], [address], [place], [place] [address] Lab: [phone] Dir: Jolene Schimke, MD, MMM  Professional component performed at [facility], [facility], [address] [place], [place], [place] [address] Lab: [phone] Dir: Beryle Quant, MD     PHQ2/9:    06/03/2022    1:36 PM 01/02/2022    1:13 PM 11/29/2021    9:53 AM 11/07/2021    2:14 PM 11/05/2021    9:56 AM  Depression screen PHQ 2/9  Decreased Interest 0 0 0 0 0  Down, Depressed, Hopeless 0 0 0 0 0  PHQ - 2 Score 0 0 0 0 0  Altered sleeping 0 0 0 0   Tired, decreased energy 0 0 0 0   Change in appetite 0 0 0 0   Feeling bad or failure about yourself  0 0 0 0   Trouble concentrating 0 0 0 0   Moving slowly or  fidgety/restless 0 0 0 0   Suicidal thoughts 0 0 0 0   PHQ-9 Score 0 0 0 0   Difficult doing work/chores  Not difficult at all  Not difficult at all     phq 9 is negative   Fall Risk:    06/03/2022    1:36 PM 01/02/2022    1:13 PM 11/29/2021    9:53 AM 11/07/2021    2:14 PM 11/05/2021    9:55 AM  Fall Risk   Falls in the past year? 0 0 0 0 0  Number falls in past yr: 0 0 0 0 0  Injury with Fall? 0 0 0 0 0  Risk for fall due to : No Fall Risks No Fall Risks No Fall Risks No Fall Risks No Fall Risks  Follow up Falls prevention discussed Falls prevention discussed;Education provided Falls prevention discussed Falls prevention discussed;Education provided Falls prevention discussed      Functional Status Survey: Is the patient deaf or have difficulty hearing?: No Does the patient have difficulty seeing, even when wearing glasses/contacts?: No Does the patient have difficulty concentrating, remembering, or making decisions?: No Does the patient have difficulty walking or climbing stairs?: No Does the patient have difficulty dressing or bathing?: No Does the patient have difficulty doing errands alone such as visiting a doctor's office or shopping?: No    Assessment & Plan  1. Need for immunization against influenza  - Flu Vaccine QUAD 6+ mos PF IM (Fluarix Quad PF)  2. Primary hypertension  - valsartan-hydrochlorothiazide (DIOVAN HCT) 80-12.5 MG tablet; Take 1 tablet by mouth daily.  Dispense: 90 tablet; Refill: 1  3. Seasonal allergic rhinitis, unspecified trigger  - fluticasone (FLONASE) 50 MCG/ACT nasal spray; Place 2 sprays into both nostrils daily.  Dispense: 16 g; Refill: 2 - azelastine (ASTELIN) 0.1 % nasal spray; Place 2 sprays into both nostrils 2 (two) times daily. Use in each nostril as directed  Dispense: 30 mL; Refill: 2  4. Mild intermittent asthma with acute exacerbation  - albuterol (VENTOLIN HFA) 108 (90 Base) MCG/ACT inhaler; Inhale 2 puffs into the lungs  every 4 (four) hours as needed for wheezing or shortness of breath.  Dispense: 18 g; Refill: 0  5. Morbid obesity (HCC)  Discussed with the patient the risk posed by an increased BMI. Discussed importance of portion control, calorie counting and at least 150 minutes of physical activity weekly. Avoid sweet beverages and drink more water. Eat at least 6 servings  of fruit and vegetables daily    Discussed bariatric surgery and he will think about it  6. Splenomegaly   7. Fatty liver  Abnormal CT liver possible cirrhosis, we will forward note to GI  to make sure CT results reviewed may need to see hepatologist   8. Tachycardia   9. Pre-diabetes

## 2022-06-03 ENCOUNTER — Ambulatory Visit: Payer: Federal, State, Local not specified - PPO | Admitting: Family Medicine

## 2022-06-03 ENCOUNTER — Encounter: Payer: Self-pay | Admitting: Family Medicine

## 2022-06-03 DIAGNOSIS — R000 Tachycardia, unspecified: Secondary | ICD-10-CM

## 2022-06-03 DIAGNOSIS — K760 Fatty (change of) liver, not elsewhere classified: Secondary | ICD-10-CM

## 2022-06-03 DIAGNOSIS — J4521 Mild intermittent asthma with (acute) exacerbation: Secondary | ICD-10-CM | POA: Diagnosis not present

## 2022-06-03 DIAGNOSIS — I10 Essential (primary) hypertension: Secondary | ICD-10-CM | POA: Diagnosis not present

## 2022-06-03 DIAGNOSIS — Z23 Encounter for immunization: Secondary | ICD-10-CM | POA: Diagnosis not present

## 2022-06-03 DIAGNOSIS — J302 Other seasonal allergic rhinitis: Secondary | ICD-10-CM

## 2022-06-03 DIAGNOSIS — R161 Splenomegaly, not elsewhere classified: Secondary | ICD-10-CM

## 2022-06-03 DIAGNOSIS — R7303 Prediabetes: Secondary | ICD-10-CM

## 2022-06-03 MED ORDER — ALBUTEROL SULFATE HFA 108 (90 BASE) MCG/ACT IN AERS: 2.0000 | INHALATION_SPRAY | RESPIRATORY_TRACT | 0 refills | Status: DC | PRN

## 2022-06-03 MED ORDER — AZELASTINE HCL 0.1 % NA SOLN: 2.0000 | Freq: Two times a day (BID) | NASAL | 2 refills | Status: DC

## 2022-06-03 MED ORDER — FLUTICASONE PROPIONATE 50 MCG/ACT NA SUSP: 2.0000 | Freq: Every day | NASAL | 2 refills | Status: DC

## 2022-06-03 MED ORDER — VALSARTAN-HYDROCHLOROTHIAZIDE 80-12.5 MG PO TABS: 1.0000 | ORAL_TABLET | Freq: Every day | ORAL | 1 refills | Status: DC

## 2022-08-01 DIAGNOSIS — Z87898 Personal history of other specified conditions: Secondary | ICD-10-CM | POA: Diagnosis not present

## 2022-08-01 DIAGNOSIS — R161 Splenomegaly, not elsewhere classified: Secondary | ICD-10-CM | POA: Diagnosis not present

## 2022-08-04 ENCOUNTER — Other Ambulatory Visit: Payer: Self-pay | Admitting: Gastroenterology

## 2022-08-04 DIAGNOSIS — Z87898 Personal history of other specified conditions: Secondary | ICD-10-CM

## 2022-08-04 DIAGNOSIS — R161 Splenomegaly, not elsewhere classified: Secondary | ICD-10-CM

## 2022-12-01 NOTE — Progress Notes (Signed)
Name: [name]   MRN: [id number]    DOB: 05/23/1991   Date:12/02/2022       Progress Note  Subjective  Chief Complaint  Follow Up  HPI  Asthma intermittent : currently under control, he is doing well at this time, denies no  cough, wheezing or sob, only Breo for flares . Does not need a refill today   Morbid obesity:  His weight at age 32 was 312 lbs.He was taking Metformin and we added Rybelsus in the Fall 22 but off medication due to nausea. Weight was  trending after the stopped taking Rybelsus and Metformin - it went up to 498 lbs, he is now working from home, eating out less often and walking every morning for 40 minutes , he lost 8 lbs with life style modification   HTN: currently on diovan hctz 80/12.5 , no side effects of medication, bp has been at goal, no chest pain or palpitation , SOB improved with increase of physical activity   Pre-diabetes: last A1C 5.8 and we will recheck level  He denies polyphagia, polydipsia or polyuria , no longer taking Metformin or Rybelsus due to nausea and vomiting. We will recheck labs   Anemia unspecified: he has enlarged spleen, normal ferritin and iron, normal B12 we will refer him to hematologist  Fatty liver, enlarged spleen and liver: he was seen by GI last year due to increase in nausea and vomiting while taking Rybelsus but that persisted after he stopped medication. EGD and colonoscopy, it showed some gastritis, he took PPI but now off all medication and no longer having dyspepsia.   Patient Active Problem List   Diagnosis Date Noted   Umbilical hernia without obstruction and without gangrene 11/29/2021   Splenomegaly 11/29/2021   Fatty liver 11/29/2021   Pre-diabetes 04/12/2021   Primary hypertension 04/12/2021   COVID-19 virus infection 02/07/2019   Asthma, mild intermittent    Morbid obesity (HCC) 02/08/2008    Past Surgical History:  Procedure Laterality Date   COLONOSCOPY WITH PROPOFOL N/A 05/23/2022   Procedure:  COLONOSCOPY WITH PROPOFOL;  Surgeon: Regis Bill, MD;  Location: [facility];  Service: Endoscopy;  Laterality: N/A;   ESOPHAGOGASTRODUODENOSCOPY (EGD) WITH PROPOFOL N/A 05/23/2022   Procedure: ESOPHAGOGASTRODUODENOSCOPY (EGD) WITH PROPOFOL;  Surgeon: Regis Bill, MD;  Location: [facility];  Service: Endoscopy;  Laterality: N/A;    Family History  Problem Relation Age of Onset   Obesity Mother    Kidney disease Father        mass   Liver cancer Father    Obesity Sister     Social History   Tobacco Use   Smoking status: Never   Smokeless tobacco: Never  Substance Use Topics   Alcohol use: Yes    Alcohol/week: 1.0 standard drink of alcohol    Types: 1 Cans of beer per week     Current Outpatient Medications:    albuterol (VENTOLIN HFA) 108 (90 Base) MCG/ACT inhaler, Inhale 2 puffs into the lungs every 4 (four) hours as needed for wheezing or shortness of breath., Disp: 18 g, Rfl: 0   azelastine (ASTELIN) 0.1 % nasal spray, Place 2 sprays into both nostrils 2 (two) times daily. Use in each nostril as directed, Disp: 30 mL, Rfl: 2   fluticasone (FLONASE) 50 MCG/ACT nasal spray, Place 2 sprays into both nostrils daily., Disp: 16 g, Rfl: 2   fluticasone furoate-vilanterol (BREO ELLIPTA) 100-25 MCG/ACT AEPB, Inhale 1 puff into the lungs daily. (Patient not taking:  Reported on 06/03/2022), Disp: 1 each, Rfl: 2   valsartan-hydrochlorothiazide (DIOVAN HCT) 80-12.5 MG tablet, Take 1 tablet by mouth daily., Disp: 90 tablet, Rfl: 1  No Known Allergies  I personally reviewed active problem list, medication list, allergies, family history, social history, health maintenance with the patient/caregiver today.   ROS  Ten systems reviewed and is negative except as mentioned in HPI   Objective  Vitals:   12/02/22 1331  BP: 124/76  Pulse: 96  Resp: 18  Temp: 97.8 F (36.6 C)  TempSrc: Oral  SpO2: 100%  Weight: (!) 490 lb 12.8 oz (222.6 kg)  Height: 6\' 4"  (1.93 m)     Body mass index is 59.74 kg/m.  Physical Exam  Constitutional: Patient appears well-developed and well-nourished. Obese  No distress.  HEENT: head atraumatic, normocephalic, pupils equal and reactive to light, neck supple Cardiovascular: Normal rate, regular rhythm and normal heart sounds.  No murmur heard. No BLE edema. Pulmonary/Chest: Effort normal and breath sounds normal. No respiratory distress. Abdominal: Soft.  There is no tenderness. Psychiatric: Patient has a normal mood and affect. behavior is normal. Judgment and thought content normal.   PHQ2/9:    12/02/2022    1:28 PM 06/03/2022    1:36 PM 01/02/2022    1:13 PM 11/29/2021    9:53 AM 11/07/2021    2:14 PM  Depression screen PHQ 2/9  Decreased Interest 0 0 0 0 0  Down, Depressed, Hopeless 0 0 0 0 0  PHQ - 2 Score 0 0 0 0 0  Altered sleeping 0 0 0 0 0  Tired, decreased energy 0 0 0 0 0  Change in appetite 0 0 0 0 0  Feeling bad or failure about yourself  0 0 0 0 0  Trouble concentrating 0 0 0 0 0  Moving slowly or fidgety/restless 0 0 0 0 0  Suicidal thoughts 0 0 0 0 0  PHQ-9 Score 0 0 0 0 0  Difficult doing work/chores   Not difficult at all  Not difficult at all    phq 9 is negative   Fall Risk:    12/02/2022    1:28 PM 06/03/2022    1:36 PM 01/02/2022    1:13 PM 11/29/2021    9:53 AM 11/07/2021    2:14 PM  Fall Risk   Falls in the past year? 0 0 0 0 0  Number falls in past yr:  0 0 0 0  Injury with Fall?  0 0 0 0  Risk for fall due to : No Fall Risks No Fall Risks No Fall Risks No Fall Risks No Fall Risks  Follow up Falls prevention discussed Falls prevention discussed Falls prevention discussed;Education provided Falls prevention discussed Falls prevention discussed;Education provided    Assessment & Plan  1. Morbid obesity (HCC)  Discussed with the patient the risk posed by an increased BMI. Discussed importance of portion control, calorie counting and at least 150 minutes of physical activity weekly.  Avoid sweet beverages and drink more water. Eat at least 6 servings of fruit and vegetables daily    2. Seasonal allergic rhinitis, unspecified trigger  Stable   3. Primary hypertension  - valsartan-hydrochlorothiazide (DIOVAN HCT) 80-12.5 MG tablet; Take 1 tablet by mouth daily.  Dispense: 90 tablet; Refill: 1  4. Fatty liver  Keep follow up with GI, losing weight will be helpful   5. Pre-diabetes  - Hemoglobin A1c  6. Splenomegaly  - Lipid panel  7. Mild  intermittent asthma without complication   8. Anemia, unspecified type  - Ambulatory referral to Hematology / Oncology

## 2022-12-02 ENCOUNTER — Ambulatory Visit: Payer: Federal, State, Local not specified - PPO | Admitting: Family Medicine

## 2022-12-02 ENCOUNTER — Encounter: Payer: Self-pay | Admitting: Family Medicine

## 2022-12-02 DIAGNOSIS — K760 Fatty (change of) liver, not elsewhere classified: Secondary | ICD-10-CM | POA: Diagnosis not present

## 2022-12-02 DIAGNOSIS — J302 Other seasonal allergic rhinitis: Secondary | ICD-10-CM | POA: Diagnosis not present

## 2022-12-02 DIAGNOSIS — J4520 Mild intermittent asthma, uncomplicated: Secondary | ICD-10-CM

## 2022-12-02 DIAGNOSIS — R7303 Prediabetes: Secondary | ICD-10-CM

## 2022-12-02 DIAGNOSIS — R161 Splenomegaly, not elsewhere classified: Secondary | ICD-10-CM | POA: Diagnosis not present

## 2022-12-02 DIAGNOSIS — I10 Essential (primary) hypertension: Secondary | ICD-10-CM | POA: Diagnosis not present

## 2022-12-02 DIAGNOSIS — D649 Anemia, unspecified: Secondary | ICD-10-CM

## 2022-12-02 MED ORDER — VALSARTAN-HYDROCHLOROTHIAZIDE 80-12.5 MG PO TABS: 1.0000 | ORAL_TABLET | Freq: Every day | ORAL | 1 refills | Status: DC

## 2022-12-03 LAB — LIPID PANEL
Cholesterol: 144 mg/dL (ref <200)
HDL: 42 mg/dL (ref >=40)
LDL Cholesterol (Calc): 81 mg/dL (calc)
Non-HDL Cholesterol (Calc): 102 mg/dL (calc) (ref <130)
Total CHOL/HDL Ratio: 3.4 (calc) (ref <5.0)
Triglycerides: 113 mg/dL (ref <150)

## 2022-12-03 LAB — HEMOGLOBIN A1C
Hgb A1c MFr Bld: 5.9 % of total Hgb — ABNORMAL HIGH (ref <5.7)
Mean Plasma Glucose: 123 mg/dL
eAG (mmol/L): 6.8 mmol/L

## 2022-12-09 ENCOUNTER — Encounter: Payer: Self-pay | Admitting: Internal Medicine

## 2022-12-09 ENCOUNTER — Inpatient Hospital Stay: Payer: Federal, State, Local not specified - PPO | Attending: Internal Medicine | Admitting: Internal Medicine

## 2022-12-09 ENCOUNTER — Inpatient Hospital Stay: Payer: Federal, State, Local not specified - PPO

## 2022-12-09 VITALS — BP 138/116 | HR 104 | Temp 97.0°F | Ht 76.0 in | Wt >= 6400 oz

## 2022-12-09 DIAGNOSIS — D509 Iron deficiency anemia, unspecified: Secondary | ICD-10-CM | POA: Diagnosis not present

## 2022-12-09 DIAGNOSIS — Z79899 Other long term (current) drug therapy: Secondary | ICD-10-CM | POA: Diagnosis not present

## 2022-12-09 DIAGNOSIS — J4520 Mild intermittent asthma, uncomplicated: Secondary | ICD-10-CM | POA: Insufficient documentation

## 2022-12-09 DIAGNOSIS — I10 Essential (primary) hypertension: Secondary | ICD-10-CM | POA: Insufficient documentation

## 2022-12-09 DIAGNOSIS — Z800 Family history of malignant neoplasm of digestive organs: Secondary | ICD-10-CM | POA: Diagnosis not present

## 2022-12-09 DIAGNOSIS — Z7951 Long term (current) use of inhaled steroids: Secondary | ICD-10-CM | POA: Insufficient documentation

## 2022-12-09 LAB — CBC WITH DIFFERENTIAL/PLATELET
Abs Immature Granulocytes: 0.03 10*3/uL (ref 0.00–0.07)
Basophils Absolute: 0 10*3/uL (ref 0.0–0.1)
Basophils Relative: 0 %
Eosinophils Absolute: 0.1 10*3/uL (ref 0.0–0.5)
Eosinophils Relative: 1 %
HCT: 35.3 % — ABNORMAL LOW (ref 39.0–52.0)
Hemoglobin: 11.3 g/dL — ABNORMAL LOW (ref 13.0–17.0)
Immature Granulocytes: 0 %
Lymphocytes Relative: 24 %
Lymphs Abs: 2.2 10*3/uL (ref 0.7–4.0)
MCH: 22.8 pg — ABNORMAL LOW (ref 26.0–34.0)
MCHC: 32 g/dL (ref 30.0–36.0)
MCV: 71.3 fL — ABNORMAL LOW (ref 80.0–100.0)
Monocytes Absolute: 0.6 10*3/uL (ref 0.1–1.0)
Monocytes Relative: 6 %
Neutro Abs: 6.3 10*3/uL (ref 1.7–7.7)
Neutrophils Relative %: 69 %
Platelets: 294 10*3/uL (ref 150–400)
RBC: 4.95 MIL/uL (ref 4.22–5.81)
RDW: 14.9 % (ref 11.5–15.5)
WBC: 9.2 10*3/uL (ref 4.0–10.5)
nRBC: 0 % (ref 0.0–0.2)

## 2022-12-09 LAB — RETICULOCYTES
Immature Retic Fract: 13.7 % (ref 2.3–15.9)
RBC.: 4.88 MIL/uL (ref 4.22–5.81)
Retic Count, Absolute: 66.4 10*3/uL (ref 19.0–186.0)
Retic Ct Pct: 1.4 % (ref 0.4–3.1)

## 2022-12-09 LAB — COMPREHENSIVE METABOLIC PANEL
ALT: 17 U/L (ref 0–44)
AST: 18 U/L (ref 15–41)
Albumin: 3.8 g/dL (ref 3.5–5.0)
Alkaline Phosphatase: 68 U/L (ref 38–126)
Anion gap: 9 (ref 5–15)
BUN: 16 mg/dL (ref 6–20)
CO2: 26 mmol/L (ref 22–32)
Calcium: 9.3 mg/dL (ref 8.9–10.3)
Chloride: 102 mmol/L (ref 98–111)
Creatinine, Ser: 0.85 mg/dL (ref 0.61–1.24)
GFR, Estimated: >60 mL/min (ref >60)
Glucose, Bld: 97 mg/dL (ref 70–99)
Potassium: 3.9 mmol/L (ref 3.5–5.1)
Sodium: 137 mmol/L (ref 135–145)
Total Bilirubin: 0.5 mg/dL (ref 0.3–1.2)
Total Protein: 8 g/dL (ref 6.5–8.1)

## 2022-12-09 LAB — LACTATE DEHYDROGENASE: LDH: 115 U/L (ref 98–192)

## 2022-12-09 LAB — TECHNOLOGIST SMEAR REVIEW
Plt Morphology: NORMAL
RBC MORPHOLOGY: NORMAL
WBC MORPHOLOGY: NORMAL

## 2022-12-09 NOTE — Assessment & Plan Note (Addendum)
#   Microcytic anemia mild-March reviewed for hemoglobin 11 [MCV 76]; platelets within normal;  EGD/Col [2023- Dr.Locklear-]- NO evidence of bleeding.  Clinically suspicious for thalassemia-especially as patient is asymptomatic.  March 2024 iron studies-saturation 20%; ferritin normal [] PCP  # I had a long discussion with the patient regarding  other etiologies include -benign like nutritional/malabsorption, liver disease, chronic kidney disease viral infections, autoimmune; blood loss etc.  Low clinical concern for any malignant causes.  I recommend CBC CMP LDH haptoglobin; review of peripheral smear.  Again most likely thalassemia-await workup as ordered.  Thank you Dr. Carlynn Purl for allowing me to participate in the care of your pleasant patient. Please do not hesitate to contact me with questions or concerns in the interim.  # DISPOSITION: # labs today # follow up 2-3  weeks- MD;NO labs-  Dr.B

## 2022-12-09 NOTE — Progress Notes (Signed)
[facility] CONSULT NOTE  Patient Care Team: Alba Cory, MD as PCP - General (Family Medicine)  CHIEF COMPLAINTS/PURPOSE OF CONSULTATION: ANEMIA   HEMATOLOGY HISTORY  # ANEMIA[Hb; MCV-platelets- WBC; Iron sat; ferritin;  GFR- CT/US; EGD/colonoscopy-  HISTORY OF PRESENTING ILLNESS:  Geraldo Haris 32 y.o.  male pleasant patient was been referred to [place] for further evaluation of anemia.  Told to be anemic in past. But patient denies being any symptomatic with any fatigue.   Blood in stools: none Blood in urine:none Difficulty swallowing:none Change of bowel movement/constipation:none Prior blood transfusion:none Prior history of blood loss: none Kidney/ Liver disease:none Alcohol: none Bariatric surgery:none  Prior evaluation with hematology:none Prior bone marrow biopsy: none Oral iron: none Prior IV iron infusions: none   Review of Systems  Constitutional:  Negative for chills, diaphoresis, fever, malaise/fatigue and weight loss.  HENT:  Negative for nosebleeds and sore throat.   Eyes:  Negative for double vision.  Respiratory:  Negative for cough, hemoptysis, sputum production, shortness of breath and wheezing.   Cardiovascular:  Negative for chest pain, palpitations, orthopnea and leg swelling.  Gastrointestinal:  Negative for abdominal pain, blood in stool, constipation, diarrhea, heartburn, melena, nausea and vomiting.  Genitourinary:  Negative for dysuria, frequency and urgency.  Musculoskeletal:  Negative for back pain and joint pain.  Skin: Negative.  Negative for itching and rash.  Neurological:  Negative for dizziness, tingling, focal weakness, weakness and headaches.  Endo/Heme/Allergies:  Does not bruise/bleed easily.  Psychiatric/Behavioral:  Negative for depression. The patient is not nervous/anxious and does not have insomnia.    MEDICAL HISTORY:  Past Medical History:  Diagnosis Date   Asthma, mild intermittent    Chronic constipation     Hypertension    Morbid obesity (HCC)     SURGICAL HISTORY: Past Surgical History:  Procedure Laterality Date   COLONOSCOPY WITH PROPOFOL N/A 05/23/2022   Procedure: COLONOSCOPY WITH PROPOFOL;  Surgeon: Regis Bill, MD;  Location: [facility]COPY;  Service: Endoscopy;  Laterality: N/A;   ESOPHAGOGASTRODUODENOSCOPY (EGD) WITH PROPOFOL N/A 05/23/2022   Procedure: ESOPHAGOGASTRODUODENOSCOPY (EGD) WITH PROPOFOL;  Surgeon: Regis Bill, MD;  Location: [facility] END[facility]COPY;  Service: Endoscopy;  Laterality: N/A;    SOCIAL HISTORY: Social History   Socioeconomic History   Marital status: Single    Spouse name: Not on file   Number of children: 0   Years of education: Not on file   Highest education level: Some college, no degree  Occupational History   Occupation: Management consultant   Tobacco Use   Smoking status: Never   Smokeless tobacco: Never  Vaping Use   Vaping status: Never Used  Substance and Sexual Activity   Alcohol use: Yes    Alcohol/week: 1.0 standard drink of alcohol    Types: 1 Cans of beer per week   Drug use: Not Currently   Sexual activity: Yes    Partners: Female    Comment: not always   Other Topics Concern   Not on file  Social History Narrative   Not on file   Social Determinants of Health   Financial Resource Strain: Low Risk  (08/01/2021)   Overall Financial Resource Strain (CARDIA)    Difficulty of Paying Living Expenses: Not hard at all  Food Insecurity: No Food Insecurity (12/09/2022)   Hunger Vital Sign    Worried About Running Out of Food in the Last Year: Never true    Ran Out of Food in the Last Year: Never true  Transportation Needs: No Transportation Needs (12/09/2022)   PRAPARE - Administrator, Civil Service (Medical): No    Lack of Transportation (Non-Medical): No  Physical Activity: Insufficiently Active (08/01/2021)   Exercise Vital Sign    Days of Exercise per Week: 2 days    Minutes of Exercise per  Session: 20 min  Stress: No Stress Concern Present (08/01/2021)   [employer] of Occupational Health - Occupational Stress Questionnaire    Feeling of Stress : Not at all  Social Connections: Moderately Isolated (08/01/2021)   Social Connection and Isolation Panel [NHANES]    Frequency of Communication with Friends and Family: More than three times a week    Frequency of Social Gatherings with Friends and Family: More than three times a week    Attends Religious Services: Never    Database administrator or Organizations: Yes    Attends Banker Meetings: Never    Marital Status: Never married  Intimate Partner Violence: Not At Risk (12/09/2022)   Humiliation, Afraid, Rape, and Kick questionnaire    Fear of Current or Ex-Partner: No    Emotionally Abused: No    Physically Abused: No    Sexually Abused: No    FAMILY HISTORY: Family History  Problem Relation Age of Onset   Obesity Mother    Kidney disease Father        mass   Liver cancer Father    Obesity Sister     ALLERGIES:  has No Known Allergies.  MEDICATIONS:  Current Outpatient Medications  Medication Sig Dispense Refill   albuterol (VENTOLIN HFA) 108 (90 Base) MCG/ACT inhaler Inhale 2 puffs into the lungs every 4 (four) hours as needed for wheezing or shortness of breath. 18 g 0   azelastine (ASTELIN) 0.1 % nasal spray Place 2 sprays into both nostrils 2 (two) times daily. Use in each nostril as directed 30 mL 2   fluticasone (FLONASE) 50 MCG/ACT nasal spray Place 2 sprays into both nostrils daily. 16 g 2   fluticasone furoate-vilanterol (BREO ELLIPTA) 100-25 MCG/ACT AEPB Inhale 1 puff into the lungs daily. 1 each 2   valsartan-hydrochlorothiazide (DIOVAN HCT) 80-12.5 MG tablet Take 1 tablet by mouth daily. 90 tablet 1   No current facility-administered medications for this visit.     PHYSICAL EXAMINATION:   Vitals:   12/09/22 1357  BP: (!) 138/116  Pulse: (!) 104  Temp: (!) 97 F (36.1 C)   SpO2: 98%   Filed Weights   12/09/22 1357  Weight: (!) 491 lb (222.7 kg)    Physical Exam Vitals and nursing note reviewed.  HENT:     Head: Normocephalic and atraumatic.     Mouth/Throat:     Pharynx: Oropharynx is clear.  Eyes:     Extraocular Movements: Extraocular movements intact.     Pupils: Pupils are equal, round, and reactive to light.  Cardiovascular:     Rate and Rhythm: Normal rate and regular rhythm.  Pulmonary:     Comments: Decreased breath sounds bilaterally.  Abdominal:     Palpations: Abdomen is soft.  Musculoskeletal:        General: Normal range of motion.     Cervical back: Normal range of motion.  Skin:    General: Skin is warm.  Neurological:     General: No focal deficit present.     Mental Status: He is alert and oriented to person, place, and time.  Psychiatric:  Behavior: Behavior normal.        Judgment: Judgment normal.      LABORATORY DATA:  I have reviewed the data as listed Lab Results  Component Value Date   WBC 9.2 12/09/2022   HGB 11.3 (L) 12/09/2022   HCT 35.3 (L) 12/09/2022   MCV 71.3 (L) 12/09/2022   PLT 294 12/09/2022   Recent Labs    01/02/22 1429 12/09/22 1440  NA 140 137  K 4.3 3.9  CL 103 102  CO2 29 26  GLUCOSE 91 97  BUN 16 16  CREATININE 0.92 0.85  CALCIUM 9.6 9.3  GFRNONAA  --  >60  PROT 7.3 8.0  ALBUMIN  --  3.8  AST 12 18  ALT 9 17  ALKPHOS  --  68  BILITOT 0.5 0.5     No results found.  ASSESSMENT & PLAN:   Microcytic anemia # Microcytic anemia mild-March reviewed for hemoglobin 11 [MCV 76]; platelets within normal;  EGD/Col [2023- Dr.Locklear-]- NO evidence of bleeding.  Clinically suspicious for thalassemia-especially as patient is asymptomatic.  March 2024 iron studies-saturation 20%; ferritin normal [] PCP  # I had a long discussion with the patient regarding  other etiologies include -benign like nutritional/malabsorption, liver disease, chronic kidney disease viral infections,  autoimmune; blood loss etc.  Low clinical concern for any malignant causes.  I recommend CBC CMP LDH haptoglobin; review of peripheral smear.  Again most likely thalassemia-await workup as ordered.  Thank you Dr. Carlynn Purl for allowing me to participate in the care of your pleasant patient. Please do not hesitate to contact me with questions or concerns in the interim.  # DISPOSITION: # labs today # follow up 2-3  weeks- MD;NO labs-  Dr.B  All questions were answered. The patient knows to call the clinic with any problems, questions or concerns.   Earna Coder, MD 12/09/2022 4:06 PM

## 2022-12-09 NOTE — Progress Notes (Signed)
No concerns today 

## 2022-12-10 LAB — HAPTOGLOBIN: Haptoglobin: 305 mg/dL (ref 17–317)

## 2022-12-11 LAB — HGB SOLUBILITY: Hgb Solubility: POSITIVE — AB

## 2022-12-11 LAB — HGB FRACTIONATION CASCADE
Hgb A2: 3.1 % (ref 1.8–3.2)
Hgb A: 60.5 % — ABNORMAL LOW (ref 96.4–98.8)
Hgb F: 0 % (ref 0.0–2.0)
Hgb S: 36.4 % — ABNORMAL HIGH

## 2022-12-22 ENCOUNTER — Encounter: Payer: Self-pay | Admitting: Family Medicine

## 2022-12-22 ENCOUNTER — Telehealth: Payer: Federal, State, Local not specified - PPO | Admitting: Family Medicine

## 2022-12-22 DIAGNOSIS — J069 Acute upper respiratory infection, unspecified: Secondary | ICD-10-CM

## 2022-12-22 DIAGNOSIS — J4520 Mild intermittent asthma, uncomplicated: Secondary | ICD-10-CM | POA: Diagnosis not present

## 2022-12-22 DIAGNOSIS — R110 Nausea: Secondary | ICD-10-CM | POA: Diagnosis not present

## 2022-12-22 MED ORDER — BENZONATATE 100 MG PO CAPS
100.0000 mg | ORAL_CAPSULE | Freq: Three times a day (TID) | ORAL | 0 refills | Status: DC | PRN
Start: 2022-12-22 — End: 2023-03-10

## 2022-12-22 MED ORDER — NIRMATRELVIR/RITONAVIR (PAXLOVID)TABLET
3.0000 | ORAL_TABLET | Freq: Two times a day (BID) | ORAL | 0 refills | Status: AC
Start: 2022-12-22 — End: 2022-12-27

## 2022-12-22 MED ORDER — ONDANSETRON 4 MG PO TBDP
4.0000 mg | ORAL_TABLET | Freq: Three times a day (TID) | ORAL | 0 refills | Status: DC | PRN
Start: 2022-12-22 — End: 2023-03-10

## 2022-12-22 NOTE — Progress Notes (Signed)
Name: [name]   MRN: [id number]    DOB: September 08, 1990   Date:12/22/2022       Progress Note  Subjective:    Chief Complaint  Chief Complaint  Patient presents with   Nasal Congestion    Sx started Saturday afternoon, did a home covid test-Negative   Cough   Fatigue   Nausea    I connected with  [name]  on 12/22/22 at  1:20 PM EDT by a video enabled telemedicine application and verified that I am speaking with the correct person using two identifiers.  I discussed the limitations of evaluation and management by telemedicine and the availability of in person appointments. The patient expressed understanding and agreed to proceed. Staff also discussed with the patient that there may be a patient responsible charge related to this service. Patient Location: home  Provider Location: [facility]  Additional Individuals present:   HPI   Uri sx with fatigue congestion, mild cough Onset a few days ago covid test was neg 2 days about but he was with his mom who was + for covid He is not having wheeze or SOB Hasn't tried anything over the counter Denies CP, fever, vomiting He has hx of asthma but hasn't used his inhaler    Patient Active Problem List   Diagnosis Date Noted   Microcytic anemia 12/09/2022   Umbilical hernia without obstruction and without gangrene 11/29/2021   Splenomegaly 11/29/2021   Fatty liver 11/29/2021   Pre-diabetes 04/12/2021   Primary hypertension 04/12/2021   COVID-19 virus infection 02/07/2019   Asthma, mild intermittent    Morbid obesity (HCC) 02/08/2008    Social History   Tobacco Use   Smoking status: Never   Smokeless tobacco: Never  Substance Use Topics   Alcohol use: Yes    Alcohol/week: 1.0 standard drink of alcohol    Types: 1 Cans of beer per week     Current Outpatient Medications:    albuterol (VENTOLIN HFA) 108 (90 Base) MCG/ACT inhaler, Inhale 2 puffs into the lungs every 4 (four) hours as needed for wheezing or  shortness of breath., Disp: 18 g, Rfl: 0   azelastine (ASTELIN) 0.1 % nasal spray, Place 2 sprays into both nostrils 2 (two) times daily. Use in each nostril as directed, Disp: 30 mL, Rfl: 2   fluticasone (FLONASE) 50 MCG/ACT nasal spray, Place 2 sprays into both nostrils daily., Disp: 16 g, Rfl: 2   fluticasone furoate-vilanterol (BREO ELLIPTA) 100-25 MCG/ACT AEPB, Inhale 1 puff into the lungs daily., Disp: 1 each, Rfl: 2   valsartan-hydrochlorothiazide (DIOVAN HCT) 80-12.5 MG tablet, Take 1 tablet by mouth daily., Disp: 90 tablet, Rfl: 1  No Known Allergies  I personally reviewed active problem list, medication list, allergies, family history, social history, health maintenance, notes from last encounter, lab results, imaging with the patient/caregiver today.   Review of Systems  Constitutional: Negative.   HENT: Negative.    Eyes: Negative.   Respiratory: Negative.    Cardiovascular: Negative.   Gastrointestinal: Negative.   Endocrine: Negative.   Genitourinary: Negative.   Musculoskeletal: Negative.   Skin: Negative.   Allergic/Immunologic: Negative.   Neurological: Negative.   Hematological: Negative.   Psychiatric/Behavioral: Negative.    All other systems reviewed and are negative.     Objective:   Virtual encounter, vitals limited, only able to obtain the following There were no vitals filed for this visit. There is no height or weight on file to calculate BMI. Nursing Note and  Vital Signs reviewed.  Physical Exam Vitals and nursing note reviewed.  Constitutional:      General: He is not in acute distress.    Appearance: He is obese. He is not ill-appearing, toxic-appearing or diaphoretic.  Pulmonary:     Effort: No respiratory distress.     Comments: Able to speak in full and complete sentences, no coughing during exam Neurological:     Mental Status: He is alert.  Psychiatric:        Mood and Affect: Mood normal.     PE limited by virtual encounter  No  results found for this or any previous visit (from the past 72 hour(s)).  Assessment and Plan:     ICD-10-CM   1. Upper respiratory tract infection, unspecified type  J06.9 nirmatrelvir/ritonavir (PAXLOVID) 20 x 150 MG & 10 x 100MG  TABS    benzonatate (TESSALON) 100 MG capsule   suspect COVID with sx and exposure, advised to take another home test and option to start paxlovid if positive    2. Mild intermittent asthma without complication  J45.20    not really having wheeze/SOB yet - did discuss and recommended he f/up if any worsening, may need steroids, currently doesn't seem indicated    3. Nausea  R11.0 ondansetron (ZOFRAN-ODT) 4 MG disintegrating tablet   can use zofran, push clear fluids and bland diet for now    Discussed COVID isolation and RTW guidelines consistent with other respiratory viral precautions and asked him to mask x 5 d after fever free and when returning to work etc   -Red flags and when to present for emergency care or RTC including fever >101.29F, chest pain, shortness of breath, new/worsening/un-resolving symptoms, reviewed with patient at time of visit. Follow up and care instructions discussed and provided in AVS. - I discussed the assessment and treatment plan with the patient. The patient was provided an opportunity to ask questions and all were answered. The patient agreed with the plan and demonstrated an understanding of the instructions.  I provided 15+ minutes of non-face-to-face time during this encounter.  Danelle Berry, PA-C 12/22/22 1:25 PM

## 2022-12-24 ENCOUNTER — Inpatient Hospital Stay: Payer: Federal, State, Local not specified - PPO | Admitting: Internal Medicine

## 2023-01-06 ENCOUNTER — Encounter: Payer: Self-pay | Admitting: Internal Medicine

## 2023-01-06 ENCOUNTER — Inpatient Hospital Stay: Payer: Federal, State, Local not specified - PPO | Attending: Internal Medicine | Admitting: Internal Medicine

## 2023-01-06 VITALS — BP 150/86 | HR 116 | Temp 99.7°F | Ht 76.0 in | Wt >= 6400 oz

## 2023-01-06 DIAGNOSIS — J4520 Mild intermittent asthma, uncomplicated: Secondary | ICD-10-CM | POA: Insufficient documentation

## 2023-01-06 DIAGNOSIS — I10 Essential (primary) hypertension: Secondary | ICD-10-CM | POA: Insufficient documentation

## 2023-01-06 DIAGNOSIS — D509 Iron deficiency anemia, unspecified: Secondary | ICD-10-CM | POA: Insufficient documentation

## 2023-01-06 DIAGNOSIS — Z79899 Other long term (current) drug therapy: Secondary | ICD-10-CM | POA: Diagnosis not present

## 2023-01-06 DIAGNOSIS — Z7951 Long term (current) use of inhaled steroids: Secondary | ICD-10-CM | POA: Diagnosis not present

## 2023-01-06 NOTE — Progress Notes (Signed)
[facility] CONSULT NOTE  Patient Care Team: Alba Cory, MD as PCP - General (Family Medicine) Earna Coder, MD as Consulting Physician (Oncology)  CHIEF COMPLAINTS/PURPOSE OF CONSULTATION: ANEMIA   HEMATOLOGY HISTORY  # ANEMIA[Hb; MCV-platelets- WBC; Iron sat; ferritin;  GFR- CT/US; EGD/colonoscopy-  HISTORY OF PRESENTING ILLNESS:  [name] 32 y.o.  male pleasant patient was been referred to [place] for further evaluation of anemia.  Patient says that he is doing well, and he doesn't have any new questions or concerns for Dr. Leonard Schwartz today   Told to be anemic in past. But patient denies being any symptomatic with any fatigue.    Review of Systems  Constitutional:  Negative for chills, diaphoresis, fever, malaise/fatigue and weight loss.  HENT:  Negative for nosebleeds and sore throat.   Eyes:  Negative for double vision.  Respiratory:  Negative for cough, hemoptysis, sputum production, shortness of breath and wheezing.   Cardiovascular:  Negative for chest pain, palpitations, orthopnea and leg swelling.  Gastrointestinal:  Negative for abdominal pain, blood in stool, constipation, diarrhea, heartburn, melena, nausea and vomiting.  Genitourinary:  Negative for dysuria, frequency and urgency.  Musculoskeletal:  Negative for back pain and joint pain.  Skin: Negative.  Negative for itching and rash.  Neurological:  Negative for dizziness, tingling, focal weakness, weakness and headaches.  Endo/Heme/Allergies:  Does not bruise/bleed easily.  Psychiatric/Behavioral:  Negative for depression. The patient is not nervous/anxious and does not have insomnia.    MEDICAL HISTORY:  Past Medical History:  Diagnosis Date   Asthma, mild intermittent    Chronic constipation    Hypertension    Morbid obesity (HCC)     SURGICAL HISTORY: Past Surgical History:  Procedure Laterality Date   COLONOSCOPY WITH PROPOFOL N/A 05/23/2022   Procedure: COLONOSCOPY WITH  PROPOFOL;  Surgeon: Regis Bill, MD;  Location: [facility] ENDOSCOPY;  Service: Endoscopy;  Laterality: N/A;   ESOPHAGOGASTRODUODENOSCOPY (EGD) WITH PROPOFOL N/A 05/23/2022   Procedure: ESOPHAGOGASTRODUODENOSCOPY (EGD) WITH PROPOFOL;  Surgeon: Regis Bill, MD;  Location: [facility] ENDOSCOPY;  Service: Endoscopy;  Laterality: N/A;    SOCIAL HISTORY: Social History   Socioeconomic History   Marital status: Single    Spouse name: Not on file   Number of children: 0   Years of education: Not on file   Highest education level: Some college, no degree  Occupational History   Occupation: Management consultant   Tobacco Use   Smoking status: Never   Smokeless tobacco: Never  Vaping Use   Vaping status: Never Used  Substance and Sexual Activity   Alcohol use: Yes    Alcohol/week: 1.0 standard drink of alcohol    Types: 1 Cans of beer per week   Drug use: Not Currently   Sexual activity: Yes    Partners: Female    Comment: not always   Other Topics Concern   Not on file  Social History Narrative   Not on file   Social Determinants of Health   Financial Resource Strain: Low Risk  (08/01/2021)   Overall Financial Resource Strain (CARDIA)    Difficulty of Paying Living Expenses: Not hard at all  Food Insecurity: No Food Insecurity (12/09/2022)   Hunger Vital Sign    Worried About Running Out of Food in the Last Year: Never true    Ran Out of Food in the Last Year: Never true  Transportation Needs: No Transportation Needs (12/09/2022)   PRAPARE - Transportation    Lack of Transportation (  Medical): No    Lack of Transportation (Non-Medical): No  Physical Activity: Insufficiently Active (08/01/2021)   Exercise Vital Sign    Days of Exercise per Week: 2 days    Minutes of Exercise per Session: 20 min  Stress: No Stress Concern Present (08/01/2021)   [employer] of Occupational Health - Occupational Stress Questionnaire    Feeling of Stress : Not at all  Social Connections:  Moderately Isolated (08/01/2021)   Social Connection and Isolation Panel [NHANES]    Frequency of Communication with Friends and Family: More than three times a week    Frequency of Social Gatherings with Friends and Family: More than three times a week    Attends Religious Services: Never    Database administrator or Organizations: Yes    Attends Banker Meetings: Never    Marital Status: Never married  Intimate Partner Violence: Not At Risk (12/09/2022)   Humiliation, Afraid, Rape, and Kick questionnaire    Fear of Current or Ex-Partner: No    Emotionally Abused: No    Physically Abused: No    Sexually Abused: No    FAMILY HISTORY: Family History  Problem Relation Age of Onset   Obesity Mother    Kidney disease Father        mass   Liver cancer Father    Obesity Sister     ALLERGIES:  has No Known Allergies.  MEDICATIONS:  Current Outpatient Medications  Medication Sig Dispense Refill   albuterol (VENTOLIN HFA) 108 (90 Base) MCG/ACT inhaler Inhale 2 puffs into the lungs every 4 (four) hours as needed for wheezing or shortness of breath. 18 g 0   azelastine (ASTELIN) 0.1 % nasal spray Place 2 sprays into both nostrils 2 (two) times daily. Use in each nostril as directed 30 mL 2   benzonatate (TESSALON) 100 MG capsule Take 1 capsule (100 mg total) by mouth 3 (three) times daily as needed for cough. 30 capsule 0   fluticasone (FLONASE) 50 MCG/ACT nasal spray Place 2 sprays into both nostrils daily. 16 g 2   fluticasone furoate-vilanterol (BREO ELLIPTA) 100-25 MCG/ACT AEPB Inhale 1 puff into the lungs daily. 1 each 2   ondansetron (ZOFRAN-ODT) 4 MG disintegrating tablet Take 1 tablet (4 mg total) by mouth every 8 (eight) hours as needed for nausea or vomiting. 10 tablet 0   valsartan-hydrochlorothiazide (DIOVAN HCT) 80-12.5 MG tablet Take 1 tablet by mouth daily. 90 tablet 1   No current facility-administered medications for this visit.     PHYSICAL  EXAMINATION:   Vitals:   01/06/23 1504  BP: (!) 150/86  Pulse: (!) 116  Temp: 99.7 F (37.6 C)  SpO2: 100%   Filed Weights   01/06/23 1504  Weight: (!) 489 lb (221.8 kg)    Physical Exam Vitals and nursing note reviewed.  HENT:     Head: Normocephalic and atraumatic.     Mouth/Throat:     Pharynx: Oropharynx is clear.  Eyes:     Extraocular Movements: Extraocular movements intact.     Pupils: Pupils are equal, round, and reactive to light.  Cardiovascular:     Rate and Rhythm: Normal rate and regular rhythm.  Pulmonary:     Comments: Decreased breath sounds bilaterally.  Abdominal:     Palpations: Abdomen is soft.  Musculoskeletal:        General: Normal range of motion.     Cervical back: Normal range of motion.  Skin:    General: Skin  is warm.  Neurological:     General: No focal deficit present.     Mental Status: He is alert and oriented to person, place, and time.  Psychiatric:        Behavior: Behavior normal.        Judgment: Judgment normal.      LABORATORY DATA:  I have reviewed the data as listed Lab Results  Component Value Date   WBC 9.2 12/09/2022   HGB 11.3 (L) 12/09/2022   HCT 35.3 (L) 12/09/2022   MCV 71.3 (L) 12/09/2022   PLT 294 12/09/2022   Recent Labs    12/09/22 1440  NA 137  K 3.9  CL 102  CO2 26  GLUCOSE 97  BUN 16  CREATININE 0.85  CALCIUM 9.3  GFRNONAA >60  PROT 8.0  ALBUMIN 3.8  AST 18  ALT 17  ALKPHOS 68  BILITOT 0.5     No results found.  ASSESSMENT & PLAN:   Microcytic anemia # Microcytic anemia mild-March reviewed for hemoglobin 11 [MCV 76]; platelets within normal;  EGD/Col [2023- Dr.Locklear-]- NO evidence of bleeding.  March 2024 iron studies-saturation 20%; ferritin normal [] PCP  # Alpha thalassemia- work up NEGATIVE.  Hemoglobin electrophoresis cascade suggestive of sickle cell trait.    # Discussed regarding prenatal counseling-given sickle cell trait.  Patient does not back to having children at  this time.  However repeat plan children-recommend counseling.  Patient verbalized understanding.  # DISPOSITION: # follow up as needed- Dr.B  Dr.sowles-   All questions were answered. The patient knows to call the clinic with any problems, questions or concerns.   Earna Coder, MD 01/06/2023 3:57 PM

## 2023-01-06 NOTE — Assessment & Plan Note (Addendum)
#   Microcytic anemia mild-March reviewed for hemoglobin 11 [MCV 76]; platelets within normal;  EGD/Col [2023- Dr.Locklear-]- NO evidence of bleeding.  March 2024 iron studies-saturation 20%; ferritin normal [] PCP  # Alpha thalassemia- work up NEGATIVE.  Hemoglobin electrophoresis cascade suggestive of sickle cell trait.    # Discussed regarding prenatal counseling-given sickle cell trait.  Patient does not back to having children at this time.  However repeat plan children-recommend counseling.  Patient verbalized understanding.  # DISPOSITION: # follow up as needed- Dr.B  Dr.sowles-

## 2023-01-06 NOTE — Progress Notes (Signed)
Patient says that he is doing well, and he doesn't have any new questions or concerns for Dr. B today.

## 2023-02-16 ENCOUNTER — Encounter: Payer: Self-pay | Admitting: Internal Medicine

## 2023-03-03 ENCOUNTER — Other Ambulatory Visit: Payer: Self-pay | Admitting: Family Medicine

## 2023-03-03 DIAGNOSIS — J4521 Mild intermittent asthma with (acute) exacerbation: Secondary | ICD-10-CM

## 2023-03-09 NOTE — Progress Notes (Unsigned)
Name: [name]   MRN: [id number]    DOB: 1991/02/09   Date:03/10/2023       Progress Note  Subjective  Chief Complaint  Annual Exam  HPI  Patient presents for annual CPE.  IPSS Questionnaire (AUA-7): Over the past month.   1)  How often have you had a sensation of not emptying your bladder completely after you finish urinating?  0 - Not at all  2)  How often have you had to urinate again less than two hours after you finished urinating? 0 - Not at all  3)  How often have you found you stopped and started again several times when you urinated?  0 - Not at all  4) How difficult have you found it to postpone urination?  0 - Not at all  5) How often have you had a weak urinary stream?  0 - Not at all  6) How often have you had to push or strain to begin urination?  0 - Not at all  7) How many times did you most typically get up to urinate from the time you went to bed until the time you got up in the morning?  1 - 1 time  Total score:  0-7 mildly symptomatic   8-19 moderately symptomatic   20-35 severely symptomatic     Diet: he has been eating mostly at home, eating out at most once a week now  Exercise: walking 5 days a week for 45 minutes  Last Dental Exam: 03/10/2023 [facility] Last Eye Exam: 07/2022 [facility]  Depression: phq 9 is negative    03/10/2023    1:40 PM 12/22/2022    1:17 PM 12/09/2022    2:20 PM 12/02/2022    1:28 PM 06/03/2022    1:36 PM  Depression screen PHQ 2/9  Decreased Interest 0 0 0 0 0  Down, Depressed, Hopeless 0 0 0 0 0  PHQ - 2 Score 0 0 0 0 0  Altered sleeping 0 0  0 0  Tired, decreased energy 0 0  0 0  Change in appetite 0 0  0 0  Feeling bad or failure about yourself  0 0  0 0  Trouble concentrating 0 0  0 0  Moving slowly or fidgety/restless 0 0  0 0  Suicidal thoughts 0 0  0 0  PHQ-9 Score 0 0  0 0  Difficult doing work/chores  Not difficult at all       Hypertension:  BP Readings from Last 3 Encounters:   03/10/23 136/82  01/06/23 (!) 150/86  12/09/22 (!) 138/116    Obesity: Wt Readings from Last 3 Encounters:  03/10/23 (!) 485 lb 9.6 oz (220.3 kg)  01/06/23 (!) 489 lb (221.8 kg)  12/09/22 (!) 491 lb (222.7 kg)   BMI Readings from Last 3 Encounters:  03/10/23 59.11 kg/m  01/06/23 59.52 kg/m  12/09/22 59.77 kg/m     Lipids:  Lab Results  Component Value Date   CHOL 144 12/02/2022   CHOL 148 04/12/2021   CHOL 155 05/02/2020   Lab Results  Component Value Date   HDL 42 12/02/2022   HDL 40 04/12/2021   HDL 40 05/02/2020   Lab Results  Component Value Date   LDLCALC 81 12/02/2022   LDLCALC 92 04/12/2021   LDLCALC 94 05/02/2020   Lab Results  Component Value Date   TRIG 113 12/02/2022   TRIG 72 04/12/2021   TRIG 116  05/02/2020   Lab Results  Component Value Date   CHOLHDL 3.4 12/02/2022   CHOLHDL 3.7 04/12/2021   CHOLHDL 3.9 05/02/2020   No results found for: "LDLDIRECT" Glucose:  Glucose, Bld  Date Value Ref Range Status  12/09/2022 97 70 - 99 mg/dL Final    Comment:    Glucose reference range applies only to samples taken after fasting for at least 8 hours.  01/02/2022 91 65 - 99 mg/dL Final    Comment:    .            Fasting reference interval .   11/21/2021 117 (H) 70 - 99 mg/dL Final    Comment:    Glucose reference range applies only to samples taken after fasting for at least 8 hours.   Glucose-Capillary  Date Value Ref Range Status  02/11/2019 110 (H) 70 - 99 mg/dL Final  19/14/7829 94 70 - 99 mg/dL Final  56/21/3086 578 (H) 70 - 99 mg/dL Final    Flowsheet Row Office Visit from 03/10/2023 in [facility]  AUDIT-C Score 1       Single STD testing and prevention (HIV/chl/gon/syphilis):  yes Sexual history: uses condoms , dating a woman for the past 7 months  Hep C Screening: Completed Skin cancer: Discussed monitoring for atypical lesions Colorectal cancer: N/A Prostate cancer:  not applicable who  have ever smoked. Patient   not applicable, a candidate for screening  ECG:  11/21/2021  Vaccines:   HPV: 3 rd dose today  Tdap: 08/01/2021 Shingrix: N/A Pneumonia: 2020 Flu: 03/10/2023 COVID-19:Completed 2 doses  Advanced Care Planning: A voluntary discussion about advance care planning including the explanation and discussion of advance directives.  Discussed health care proxy and Living will, and the patient was able to identify a health care proxy as mother, Roxy Horseman.  Patient does not have a living will and power of attorney of health care   Patient Active Problem List   Diagnosis Date Noted   Microcytic anemia 12/09/2022   Umbilical hernia without obstruction and without gangrene 11/29/2021   Splenomegaly 11/29/2021   Fatty liver 11/29/2021   Pre-diabetes 04/12/2021   Primary hypertension 04/12/2021   COVID-19 virus infection 02/07/2019   Asthma, mild intermittent    Morbid obesity (HCC) 02/08/2008    Past Surgical History:  Procedure Laterality Date   COLONOSCOPY WITH PROPOFOL N/A 05/23/2022   Procedure: COLONOSCOPY WITH PROPOFOL;  Surgeon: Regis Bill, MD;  Location: [facility] ENDOSCOPY;  Service: Endoscopy;  Laterality: N/A;   ESOPHAGOGASTRODUODENOSCOPY (EGD) WITH PROPOFOL N/A 05/23/2022   Procedure: ESOPHAGOGASTRODUODENOSCOPY (EGD) WITH PROPOFOL;  Surgeon: Regis Bill, MD;  Location: [facility] ENDOSCOPY;  Service: Endoscopy;  Laterality: N/A;    Family History  Problem Relation Age of Onset   Obesity Mother    Kidney disease Father        mass   Liver cancer Father    Obesity Sister     Social History   Socioeconomic History   Marital status: Single    Spouse name: Not on file   Number of children: 0   Years of education: Not on file   Highest education level: Some college, no degree  Occupational History   Occupation: Management consultant   Tobacco Use   Smoking status: Never   Smokeless tobacco: Never  Vaping Use   Vaping status:  Never Used  Substance and Sexual Activity   Alcohol use: Yes    Alcohol/week: 1.0 standard drink of alcohol  Types: 1 Cans of beer per week   Drug use: Not Currently   Sexual activity: Yes    Partners: Female    Comment: not always   Other Topics Concern   Not on file  Social History Narrative   Not on file   Social Determinants of Health   Financial Resource Strain: Low Risk  (03/10/2023)   Overall Financial Resource Strain (CARDIA)    Difficulty of Paying Living Expenses: Not hard at all  Food Insecurity: No Food Insecurity (03/10/2023)   Hunger Vital Sign    Worried About Running Out of Food in the Last Year: Never true    Ran Out of Food in the Last Year: Never true  Transportation Needs: No Transportation Needs (03/10/2023)   PRAPARE - Administrator, Civil Service (Medical): No    Lack of Transportation (Non-Medical): No  Physical Activity: Sufficiently Active (03/10/2023)   Exercise Vital Sign    Days of Exercise per Week: 5 days    Minutes of Exercise per Session: 50 min  Stress: No Stress Concern Present (03/10/2023)   [employer] of Occupational Health - Occupational Stress Questionnaire    Feeling of Stress : Only a little  Social Connections: Socially Isolated (03/10/2023)   Social Connection and Isolation Panel [NHANES]    Frequency of Communication with Friends and Family: More than three times a week    Frequency of Social Gatherings with Friends and Family: Twice a week    Attends Religious Services: Never    Database administrator or Organizations: No    Attends Banker Meetings: Never    Marital Status: Never married  Intimate Partner Violence: Not At Risk (03/10/2023)   Humiliation, Afraid, Rape, and Kick questionnaire    Fear of Current or Ex-Partner: No    Emotionally Abused: No    Physically Abused: No    Sexually Abused: No     Current Outpatient Medications:    albuterol (VENTOLIN HFA) 108 (90 Base) MCG/ACT  inhaler, INHALE 2 PUFFS INTO THE LUNGS EVERY 4 HOURS AS NEEDED FOR WHEEZING OR SHORTNESS OF BREATH., Disp: 18 each, Rfl: 0   azelastine (ASTELIN) 0.1 % nasal spray, Place 2 sprays into both nostrils 2 (two) times daily. Use in each nostril as directed, Disp: 30 mL, Rfl: 2   fluticasone (FLONASE) 50 MCG/ACT nasal spray, Place 2 sprays into both nostrils daily., Disp: 16 g, Rfl: 2   valsartan-hydrochlorothiazide (DIOVAN HCT) 80-12.5 MG tablet, Take 1 tablet by mouth daily., Disp: 90 tablet, Rfl: 1  No Known Allergies   ROS  Constitutional: Negative for fever or significant weight change.  Respiratory: Negative for cough and shortness of breath.   Cardiovascular: Negative for chest pain or palpitations.  Gastrointestinal: Negative for abdominal pain, no bowel changes.  Musculoskeletal: Negative for gait problem or joint swelling.  Skin: Negative for rash.  Neurological: Negative for dizziness or headache.  No other specific complaints in a complete review of systems (except as listed in HPI above).    Objective  Vitals:   03/10/23 1337  BP: 136/82  Pulse: 93  Resp: 20  Temp: 97.7 F (36.5 C)  TempSrc: Oral  SpO2: 98%  Weight: (!) 485 lb 9.6 oz (220.3 kg)  Height: 6\' 4"  (1.93 m)    Body mass index is 59.11 kg/m.  Physical Exam  Constitutional: Patient appears well-developed and well-nourished. Obesity No distress.  HENT: Head: Normocephalic and atraumatic. Ears: B TMs ok, no erythema or  effusion; Nose: Nose normal. Mouth/Throat: Oropharynx is clear and moist. No oropharyngeal exudate.  Eyes: Conjunctivae and EOM are normal. Pupils are equal, round, and reactive to light. No scleral icterus.  Neck: Normal range of motion. Neck supple. No JVD present. No thyromegaly present.  Cardiovascular: Normal rate, regular rhythm and normal heart sounds.  No murmur heard. No BLE edema. Pulmonary/Chest: Effort normal and breath sounds normal. No respiratory distress. Abdominal: Soft.  Bowel sounds are normal, no distension. There is no tenderness. no masses MALE GENITALIA: Normal descended testes bilaterally, no masses palpated, no hernias, no lesions, no discharge RECTAL: not done  Musculoskeletal: Normal range of motion, no joint effusions. No gross deformities Neurological: he is alert and oriented to person, place, and time. No cranial nerve deficit. Coordination, balance, strength, speech and gait are normal.  Skin: Skin is warm and dry. No rash noted. No erythema.  Psychiatric: Patient has a normal mood and affect. behavior is normal. Judgment and thought content normal.    Fall Risk:    03/10/2023    1:42 PM 12/22/2022    1:17 PM 12/02/2022    1:28 PM 06/03/2022    1:36 PM 01/02/2022    1:13 PM  Fall Risk   Falls in the past year? 0 0 0 0 0  Number falls in past yr:  0  0 0  Injury with Fall?  0  0 0  Risk for fall due to : No Fall Risks No Fall Risks No Fall Risks No Fall Risks No Fall Risks  Follow up Falls prevention discussed;Education provided;Falls evaluation completed Falls prevention discussed;Education provided;Falls evaluation completed Falls prevention discussed Falls prevention discussed Falls prevention discussed;Education provided     Functional Status Survey: Is the patient deaf or have difficulty hearing?: No Does the patient have difficulty seeing, even when wearing glasses/contacts?: No Does the patient have difficulty concentrating, remembering, or making decisions?: No Does the patient have difficulty walking or climbing stairs?: No Does the patient have difficulty dressing or bathing?: No Does the patient have difficulty doing errands alone such as visiting a doctor's office or shopping?: No    Assessment & Plan   1. Well adult exam   2. Need for immunization against influenza  - Flu vaccine trivalent PF, 6mos and older(Flulaval,Afluria,Fluarix,Fluzone)  3. Need for HPV vaccination  - HPV 9-valent vaccine,Recombinat    Discussed calling to check coverage of vaccine first but he said it should be covered by insurance   -Prostate cancer screening and PSA options (with potential risks and benefits of testing vs not testing) were discussed along with recent recs/guidelines. -USPSTF grade A and B recommendations reviewed with patient; age-appropriate recommendations, preventive care, screening tests, etc discussed and encouraged; healthy living encouraged; see AVS for patient education given to patient -Discussed importance of 150 minutes of physical activity weekly, eat two servings of fish weekly, eat one serving of tree nuts ( cashews, pistachios, pecans, almonds.Marland Kitchen) every other day, eat 6 servings of fruit/vegetables daily and drink plenty of water and avoid sweet beverages.  -Reviewed Health Maintenance: yes

## 2023-03-10 ENCOUNTER — Encounter: Payer: Self-pay | Admitting: Family Medicine

## 2023-03-10 ENCOUNTER — Ambulatory Visit: Payer: Federal, State, Local not specified - PPO | Admitting: Family Medicine

## 2023-03-10 VITALS — BP 136/82 | HR 93 | Temp 97.7°F | Resp 20 | Ht 76.0 in | Wt >= 6400 oz

## 2023-03-10 DIAGNOSIS — Z23 Encounter for immunization: Secondary | ICD-10-CM

## 2023-03-10 DIAGNOSIS — Z0000 Encounter for general adult medical examination without abnormal findings: Secondary | ICD-10-CM | POA: Diagnosis not present

## 2023-05-26 ENCOUNTER — Ambulatory Visit: Payer: Federal, State, Local not specified - PPO | Admitting: Family Medicine

## 2023-05-29 ENCOUNTER — Ambulatory Visit: Payer: Federal, State, Local not specified - PPO | Admitting: Family Medicine

## 2023-05-29 ENCOUNTER — Encounter: Payer: Self-pay | Admitting: Family Medicine

## 2023-05-29 DIAGNOSIS — I10 Essential (primary) hypertension: Secondary | ICD-10-CM | POA: Diagnosis not present

## 2023-05-29 DIAGNOSIS — R7303 Prediabetes: Secondary | ICD-10-CM

## 2023-05-29 DIAGNOSIS — K760 Fatty (change of) liver, not elsewhere classified: Secondary | ICD-10-CM

## 2023-05-29 DIAGNOSIS — D573 Sickle-cell trait: Secondary | ICD-10-CM | POA: Diagnosis not present

## 2023-05-29 DIAGNOSIS — R161 Splenomegaly, not elsewhere classified: Secondary | ICD-10-CM

## 2023-05-29 DIAGNOSIS — J4521 Mild intermittent asthma with (acute) exacerbation: Secondary | ICD-10-CM

## 2023-05-29 NOTE — Progress Notes (Signed)
 Name: [name]   MRN: [id number]    DOB: 05/12/1991   Date:05/29/2023       Progress Note  Subjective  Chief Complaint  Chief Complaint  Patient presents with   Medical Management of Chronic Issues    HPI  Discussed the use of AI scribe software for clinical note transcription with the patient, who gave verbal consent to proceed.  History of Present Illness   [name], a patient with a history of hypertension, prediabetes, and intermittent asthma, presents for a six-month follow-up visit. He is currently on Valsartan  and CTZ 80/12.5 for hypertension, and uses Astelin  and Flonase  for allergies as needed. He also has Ventolin  for asthma, but reports not having used it in approximately two years, indicating good control of his asthma symptoms. He denies any chest pain, shortness of breath, or dizziness, and reports no known side effects from his medications.  Despite a history of prediabetes with an A1c of 5.9 in July, he reports no significant changes in his diet, although he is trying to avoid fast food and consume sugar-free drinks. He also mentions a decrease in exercise due to work demands. He has a high BMI, close to 60, and has tried Metformin  for weight loss in the past, but discontinued due to severe headaches. He has also tried GLP-1 agonist in 2023 but it caused severe nausea and vomiting and he had to stop taking it   He has a family history of bariatric surgery, with multiple family members having undergone the procedure. He expresses some consideration for the surgery but prefers to lose weight naturally. He also has a history of sickle cell trait, discovered during an investigation for anemia. He has a slightly enlarged spleen and fatty liver, but has not discussed these findings with a gastroenterologist, I will forward today's note to his GI but also advised patient to contact his office directly .  He has not been monitoring his blood pressure at home, but during the visit, his  blood pressure was initially high but normalized later.         Patient Active Problem List   Diagnosis Date Noted   Microcytic anemia 12/09/2022   Umbilical hernia without obstruction and without gangrene 11/29/2021   Splenomegaly 11/29/2021   Fatty liver 11/29/2021   Pre-diabetes 04/12/2021   Primary hypertension 04/12/2021   COVID-19 virus infection 02/07/2019   Asthma, mild intermittent    Morbid obesity (HCC) 02/08/2008    Past Surgical History:  Procedure Laterality Date   COLONOSCOPY WITH PROPOFOL  N/A 05/23/2022   Procedure: COLONOSCOPY WITH PROPOFOL ;  Surgeon: Maryruth Ole DASEN, MD;  Location: [facility] ENDOSCOPY;  Service: Endoscopy;  Laterality: N/A;   ESOPHAGOGASTRODUODENOSCOPY (EGD) WITH PROPOFOL  N/A 05/23/2022   Procedure: ESOPHAGOGASTRODUODENOSCOPY (EGD) WITH PROPOFOL ;  Surgeon: Maryruth Ole DASEN, MD;  Location: [facility] ENDOSCOPY;  Service: Endoscopy;  Laterality: N/A;    Family History  Problem Relation Age of Onset   Obesity Mother    Kidney disease Father        mass   Liver cancer Father    Obesity Sister     Social History   Tobacco Use   Smoking status: Never   Smokeless tobacco: Never  Substance Use Topics   Alcohol use: Yes    Alcohol/week: 1.0 standard drink of alcohol    Types: 1 Cans of beer per week     Current Outpatient Medications:    albuterol  (VENTOLIN  HFA) 108 (90 Base) MCG/ACT inhaler, INHALE 2 PUFFS INTO THE LUNGS EVERY  4 HOURS AS NEEDED FOR WHEEZING OR SHORTNESS OF BREATH., Disp: 18 each, Rfl: 0   azelastine  (ASTELIN ) 0.1 % nasal spray, Place 2 sprays into both nostrils 2 (two) times daily. Use in each nostril as directed, Disp: 30 mL, Rfl: 2   fluticasone  (FLONASE ) 50 MCG/ACT nasal spray, Place 2 sprays into both nostrils daily., Disp: 16 g, Rfl: 2   valsartan -hydrochlorothiazide  (DIOVAN  HCT) 80-12.5 MG tablet, Take 1 tablet by mouth daily., Disp: 90 tablet, Rfl: 1  No Known Allergies  I personally reviewed active problem list,  medication list, allergies, family history with the patient/caregiver today.   ROS  Ten systems reviewed and is negative except as mentioned in HPI    Objective  Vitals:   05/29/23 1335 05/29/23 1400  BP: (!) 154/82 138/78  Pulse: (!) 101   Resp: 18   Temp: 98.2 F (36.8 C)   TempSrc: Oral   SpO2: 99%   Weight: (!) 490 lb (222.3 kg)   Height: 6' 4 (1.93 m)     Body mass index is 59.64 kg/m.  Physical Exam  Constitutional: Patient appears well-developed and well-nourished. Obese  No distress.  HEENT: head atraumatic, normocephalic, pupils equal and reactive to light, neck supple Cardiovascular: Normal rate, regular rhythm and normal heart sounds.  No murmur heard. No BLE edema. Pulmonary/Chest: Effort normal and breath sounds normal. No respiratory distress. Abdominal: Soft.  There is no tenderness. Psychiatric: Patient has a normal mood and affect. behavior is normal. Judgment and thought content normal.   Diabetic Foot Exam:     PHQ2/9:    05/29/2023    1:34 PM 03/10/2023    1:40 PM 12/22/2022    1:17 PM 12/09/2022    2:20 PM 12/02/2022    1:28 PM  Depression screen PHQ 2/9  Decreased Interest 0 0 0 0 0  Down, Depressed, Hopeless 0 0 0 0 0  PHQ - 2 Score 0 0 0 0 0  Altered sleeping 0 0 0  0  Tired, decreased energy 0 0 0  0  Change in appetite 0 0 0  0  Feeling bad or failure about yourself  0 0 0  0  Trouble concentrating 0 0 0  0  Moving slowly or fidgety/restless 0 0 0  0  Suicidal thoughts 0 0 0  0  PHQ-9 Score 0 0 0  0  Difficult doing work/chores Not difficult at all  Not difficult at all      phq 9 is negative   Fall Risk:    05/29/2023    1:31 PM 03/10/2023    1:42 PM 12/22/2022    1:17 PM 12/02/2022    1:28 PM 06/03/2022    1:36 PM  Fall Risk   Falls in the past year? 0 0 0 0 0  Number falls in past yr: 0  0  0  Injury with Fall? 0  0  0  Risk for fall due to : No Fall Risks No Fall Risks No Fall Risks No Fall Risks No Fall Risks  Follow up  Falls prevention discussed;Education provided;Falls evaluation completed Falls prevention discussed;Education provided;Falls evaluation completed Falls prevention discussed;Education provided;Falls evaluation completed Falls prevention discussed Falls prevention discussed     Assessment & Plan     Assessment and Plan    Hypertension Well controlled on Valsartan /CTZ 80/12.5mg  daily. No reported side effects or symptoms of hypotension. -Continue current medication regimen.  Prediabetes/Morbid obesity A1c of 5.9 in July. Patient reports no significant  changes in diet or exercise since last visit. BMI remains high at 59.64. -Encourage continued efforts in diet and exercise. -Consider weight loss medications or bariatric surgery, given family history of successful bariatric surgery and persistent high BMI.  Asthma No recent exacerbations or need for Ventolin  inhaler in the past two years. -Continue current management with Ventolin  as needed.  Allergic Rhinitis Managed with Astelin  and Flonase  as needed. -Continue current management.  Splenomegaly and Fatty Liver Identified on previous imaging. No follow-up discussion noted with gastroenterologist. -Send message to gastroenterologist to discuss findings and need for follow-up.  General Health Maintenance -Encourage acquisition of home blood pressure monitor for regular monitoring. -Plan to follow up in six months.

## 2023-06-29 NOTE — Progress Notes (Signed)
No answer from pt sent a MyChart message

## 2023-07-05 ENCOUNTER — Other Ambulatory Visit: Payer: Self-pay | Admitting: Family Medicine

## 2023-07-05 DIAGNOSIS — J4521 Mild intermittent asthma with (acute) exacerbation: Secondary | ICD-10-CM

## 2023-07-06 NOTE — Telephone Encounter (Signed)
 Requested Prescriptions  Pending Prescriptions Disp Refills   albuterol  (VENTOLIN  HFA) 108 (90 Base) MCG/ACT inhaler [Pharmacy Med Name: ALBUTEROL  HFA (VENTOLIN ) INH] 18 each 0    Sig: INHALE 2 PUFFS INTO THE LUNGS EVERY 4 HOURS AS NEEDED FOR WHEEZING OR SHORTNESS OF BREATH.     Pulmonology:  Beta Agonists 2 Passed - 07/06/2023  5:28 PM      Passed - Last BP in normal range    BP Readings from Last 1 Encounters:  05/29/23 132/78         Passed - Last Heart Rate in normal range    Pulse Readings from Last 1 Encounters:  05/29/23 (!) 101         Passed - Valid encounter within last 12 months    Recent Outpatient Visits           1 month ago Morbid obesity [facility])   [facility] Arleen Lacer, MD   3 months ago Well adult exam   [facility] Arleen Lacer, MD   6 months ago Upper respiratory tract infection, unspecified type   [facility] Adeline Hone, PA-C   7 months ago Morbid obesity [facility])   [facility] Arleen Lacer, MD   1 year ago Morbid obesity [facility])   [facility] Arleen Lacer, MD       Future Appointments             In 4 months Ava Lei, Krichna, MD [facility]   In 8 months Sowles, Krichna, MD [facility], [facility]

## 2023-07-23 ENCOUNTER — Other Ambulatory Visit: Payer: Self-pay | Admitting: Family Medicine

## 2023-07-23 DIAGNOSIS — I10 Essential (primary) hypertension: Secondary | ICD-10-CM

## 2023-07-23 NOTE — Telephone Encounter (Signed)
 Last f/u 05/2023

## 2023-10-25 ENCOUNTER — Encounter: Payer: Self-pay | Admitting: Family Medicine

## 2023-10-27 ENCOUNTER — Ambulatory Visit: Admitting: Family Medicine

## 2023-10-27 ENCOUNTER — Encounter: Payer: Self-pay | Admitting: Family Medicine

## 2023-10-27 VITALS — BP 132/76 | HR 88 | Resp 16 | Ht 76.0 in | Wt >= 6400 oz

## 2023-10-27 DIAGNOSIS — R000 Tachycardia, unspecified: Secondary | ICD-10-CM | POA: Diagnosis not present

## 2023-10-27 DIAGNOSIS — R7303 Prediabetes: Secondary | ICD-10-CM

## 2023-10-27 DIAGNOSIS — K760 Fatty (change of) liver, not elsewhere classified: Secondary | ICD-10-CM

## 2023-10-27 DIAGNOSIS — R110 Nausea: Secondary | ICD-10-CM

## 2023-10-27 DIAGNOSIS — D573 Sickle-cell trait: Secondary | ICD-10-CM | POA: Diagnosis not present

## 2023-10-27 DIAGNOSIS — Z13220 Encounter for screening for lipoid disorders: Secondary | ICD-10-CM

## 2023-10-27 DIAGNOSIS — K2920 Alcoholic gastritis without bleeding: Secondary | ICD-10-CM

## 2023-10-27 DIAGNOSIS — Z23 Encounter for immunization: Secondary | ICD-10-CM | POA: Diagnosis not present

## 2023-10-27 MED ORDER — FAMOTIDINE 40 MG PO TABS: 40.0000 mg | ORAL_TABLET | Freq: Every day | ORAL | 0 refills | Status: DC

## 2023-10-27 MED ORDER — ONDANSETRON 4 MG PO TBDP
4.0000 mg | ORAL_TABLET | Freq: Three times a day (TID) | ORAL | 0 refills | Status: DC | PRN
Start: 2023-10-27 — End: 2024-04-05

## 2023-10-27 NOTE — Progress Notes (Signed)
 Name: [name]   MRN: [id number]    DOB: 06/20/1990   Date:10/27/2023       Progress Note  Subjective  Chief Complaint  Chief Complaint  Patient presents with   Form Completion   HPI   Patient came in today for FMLA forms. The forms were originally filled out for Asthma when he was working on site at [employer] office and was having asthma flares. He is now working from home and no asthma problems in a long time. He still has a rescue inhaler that he uses occasionally  He states he missed work last week due to nausea. He states since 2023 when he was taking Rybelsus  and diagnosed with chronic gastritis he has episodes of asthma. He usually wakes up feeling nauseated and has to miss work days . At most once a month. He gets one sick day per month at work. He is not currently taking medication for GERD or gastritis.   Fatty liver, based on CT results and obesity, discussed healthy diet and weight loss that may improve nausea, if symptoms persists with therapy we need to further evaluated the cause  Morbid obesity, BMI is still very high, he is trying to eat better and has been considering bariatric surgery since his sister has been very successful with her surgery ( had in Dec)   Tachycardia, noticed since end of last year, improves with rest, it may be secondary to obesity/de-conditioning , bp also improves with rest. Must lose weight.   Patient Active Problem List   Diagnosis Date Noted   Microcytic anemia 12/09/2022   Umbilical hernia without obstruction and without gangrene 11/29/2021   Splenomegaly 11/29/2021   Fatty liver 11/29/2021   Pre-diabetes 04/12/2021   Primary hypertension 04/12/2021   COVID-19 virus infection 02/07/2019   Asthma, mild intermittent    Morbid obesity (HCC) 02/08/2008    Past Surgical History:  Procedure Laterality Date   COLONOSCOPY WITH PROPOFOL  N/A 05/23/2022   Procedure: COLONOSCOPY WITH PROPOFOL ;  Surgeon: Shane Darling, MD;  Location: [facility]  ENDOSCOPY;  Service: Endoscopy;  Laterality: N/A;   ESOPHAGOGASTRODUODENOSCOPY (EGD) WITH PROPOFOL  N/A 05/23/2022   Procedure: ESOPHAGOGASTRODUODENOSCOPY (EGD) WITH PROPOFOL ;  Surgeon: Shane Darling, MD;  Location: [facility] ENDOSCOPY;  Service: Endoscopy;  Laterality: N/A;    Family History  Problem Relation Age of Onset   Obesity Mother    Kidney disease Father        mass   Liver cancer Father    Obesity Sister     Social History   Tobacco Use   Smoking status: Never   Smokeless tobacco: Never  Substance Use Topics   Alcohol use: Yes    Alcohol/week: 1.0 standard drink of alcohol    Types: 1 Cans of beer per week     Current Outpatient Medications:    albuterol  (VENTOLIN  HFA) 108 (90 Base) MCG/ACT inhaler, INHALE 2 PUFFS INTO THE LUNGS EVERY 4 HOURS AS NEEDED FOR WHEEZING OR SHORTNESS OF BREATH., Disp: 18 each, Rfl: 0   azelastine  (ASTELIN ) 0.1 % nasal spray, Place 2 sprays into both nostrils 2 (two) times daily. Use in each nostril as directed, Disp: 30 mL, Rfl: 2   famotidine  (PEPCID ) 40 MG tablet, Take 1 tablet (40 mg total) by mouth at bedtime., Disp: 90 tablet, Rfl: 0   fluticasone  (FLONASE ) 50 MCG/ACT nasal spray, Place 2 sprays into both nostrils daily., Disp: 16 g, Rfl: 2   ondansetron  (ZOFRAN -ODT) 4 MG disintegrating tablet, Take 1 tablet (  4 mg total) by mouth every 8 (eight) hours as needed for nausea or vomiting., Disp: 20 tablet, Rfl: 0   valsartan -hydrochlorothiazide  (DIOVAN -HCT) 80-12.5 MG tablet, TAKE 1 TABLET BY MOUTH EVERY DAY, Disp: 90 tablet, Rfl: 1  Allergies  Allergen Reactions   Metformin  And Related     Nausea    Ozempic (0.25 Or 0.5 Mg-Dose) [Semaglutide (0.25 Or 0.5mg -Dos)] Nausea Only    He actually tried Rybelsus  but not on database     I personally reviewed active problem list, medication list, allergies with the patient/caregiver today.   ROS  Ten systems reviewed and is negative except as mentioned in HPI    Objective Physical  Exam Constitutional: Patient appears well-developed and well-nourished. Obese  No distress.  HEENT: head atraumatic, normocephalic, pupils equal and reactive to light, neck supple Cardiovascular: Normal rate, regular rhythm and normal heart sounds.  No murmur heard. No BLE edema. Pulmonary/Chest: Effort normal and breath sounds normal. No respiratory distress. Abdominal: Soft.  There is no tenderness. Psychiatric: Patient has a normal mood and affect. behavior is normal. Judgment and thought content normal.   Vitals:   10/27/23 1333 10/27/23 1334 10/27/23 1358  BP: (!) 152/86  132/76  Pulse: (!) 123 (!) 104 88  Resp: 16    SpO2: 99%    Weight: (!) 487 lb 14.4 oz (221.3 kg)    Height: 6\' 4"  (1.93 m)      Body mass index is 59.39 kg/m.    PHQ2/9:    10/27/2023    1:29 PM 05/29/2023    1:34 PM 03/10/2023    1:40 PM 12/22/2022    1:17 PM 12/09/2022    2:20 PM  Depression screen PHQ 2/9  Decreased Interest 0 0 0 0 0  Down, Depressed, Hopeless 0 0 0 0 0  PHQ - 2 Score 0 0 0 0 0  Altered sleeping  0 0 0   Tired, decreased energy  0 0 0   Change in appetite  0 0 0   Feeling bad or failure about yourself   0 0 0   Trouble concentrating  0 0 0   Moving slowly or fidgety/restless  0 0 0   Suicidal thoughts  0 0 0   PHQ-9 Score  0 0 0   Difficult doing work/chores  Not difficult at all  Not difficult at all     phq 9 is negative  Fall Risk:    10/27/2023    1:28 PM 05/29/2023    1:31 PM 03/10/2023    1:42 PM 12/22/2022    1:17 PM 12/02/2022    1:28 PM  Fall Risk   Falls in the past year? 0 0 0 0 0  Number falls in past yr: 0 0  0   Injury with Fall? 0 0  0   Risk for fall due to : No Fall Risks No Fall Risks No Fall Risks No Fall Risks No Fall Risks  Follow up Falls prevention discussed;Education provided;Falls evaluation completed Falls prevention discussed;Education provided;Falls evaluation completed Falls prevention discussed;Education provided;Falls evaluation completed Falls  prevention discussed;Education provided;Falls evaluation completed Falls prevention discussed     Assessment & Plan  1. Chronic alcoholic gastritis without hemorrhage (Primary)  - famotidine  (PEPCID ) 40 MG tablet; Take 1 tablet (40 mg total) by mouth at bedtime.  Dispense: 90 tablet; Refill: 0  2. Fatty liver  It may be the cause of nausea, explained since episodes are less than once a month and he  has one sick day a month for work, no need at this time for FMLA  3. Nausea  - ondansetron  (ZOFRAN -ODT) 4 MG disintegrating tablet; Take 1 tablet (4 mg total) by mouth every 8 (eight) hours as needed for nausea or vomiting.  Dispense: 20 tablet; Refill: 0  4. Need for vaccination with 20-polyvalent pneumococcal conjugate vaccine  - Pneumococcal conjugate vaccine 20-valent (Prevnar 20)    4. Sickle cell trait (HCC)  Diagnosed by hematologist, chronic anemia  5. Need for vaccination with 20-polyvalent pneumococcal conjugate vaccine  - Pneumococcal conjugate vaccine 20-valent (Prevnar 20)  6. Tachycardia  - CBC with Differential/Platelet - Comprehensive metabolic panel with GFR - TSH  7. Pre-diabetes  - Hemoglobin A1c  8. Lipid screening  - Lipid panel

## 2023-10-28 ENCOUNTER — Ambulatory Visit: Payer: Self-pay | Admitting: Family Medicine

## 2023-10-28 LAB — HEMOGLOBIN A1C
Hgb A1c MFr Bld: 5.9 % — ABNORMAL HIGH (ref <5.7)
Mean Plasma Glucose: 123 mg/dL
eAG (mmol/L): 6.8 mmol/L

## 2023-10-28 LAB — COMPREHENSIVE METABOLIC PANEL WITH GFR
AG Ratio: 1.2 (calc) (ref 1.0–2.5)
ALT: 11 U/L (ref 9–46)
AST: 14 U/L (ref 10–40)
Albumin: 4 g/dL (ref 3.6–5.1)
Alkaline phosphatase (APISO): 68 U/L (ref 36–130)
BUN: 17 mg/dL (ref 7–25)
CO2: 29 mmol/L (ref 20–32)
Calcium: 9.8 mg/dL (ref 8.6–10.3)
Chloride: 103 mmol/L (ref 98–110)
Creat: 1.08 mg/dL (ref 0.60–1.26)
Globulin: 3.3 g/dL (ref 1.9–3.7)
Glucose, Bld: 87 mg/dL (ref 65–99)
Potassium: 4.5 mmol/L (ref 3.5–5.3)
Sodium: 139 mmol/L (ref 135–146)
Total Bilirubin: 0.6 mg/dL (ref 0.2–1.2)
Total Protein: 7.3 g/dL (ref 6.1–8.1)
eGFR: 94 mL/min/{1.73_m2} (ref >=60)

## 2023-10-28 LAB — LIPID PANEL
Cholesterol: 159 mg/dL (ref <200)
HDL: 38 mg/dL — ABNORMAL LOW (ref >=40)
LDL Cholesterol (Calc): 95 mg/dL
Non-HDL Cholesterol (Calc): 121 mg/dL (ref <130)
Total CHOL/HDL Ratio: 4.2 (calc) (ref <5.0)
Triglycerides: 164 mg/dL — ABNORMAL HIGH (ref <150)

## 2023-10-28 LAB — CBC WITH DIFFERENTIAL/PLATELET
Absolute Lymphocytes: 2185 {cells}/uL (ref 850–3900)
Absolute Monocytes: 553 {cells}/uL (ref 200–950)
Basophils Absolute: 34 {cells}/uL (ref 0–200)
Basophils Relative: 0.4 %
Eosinophils Absolute: 128 {cells}/uL (ref 15–500)
Eosinophils Relative: 1.5 %
HCT: 36.8 % — ABNORMAL LOW (ref 38.5–50.0)
Hemoglobin: 11.2 g/dL — ABNORMAL LOW (ref 13.2–17.1)
MCH: 22.4 pg — ABNORMAL LOW (ref 27.0–33.0)
MCHC: 30.4 g/dL — ABNORMAL LOW (ref 32.0–36.0)
MCV: 73.6 fL — ABNORMAL LOW (ref 80.0–100.0)
MPV: 10.9 fL (ref 7.5–12.5)
Monocytes Relative: 6.5 %
Neutro Abs: 5602 {cells}/uL (ref 1500–7800)
Neutrophils Relative %: 65.9 %
Platelets: 290 10*3/uL (ref 140–400)
RBC: 5 10*6/uL (ref 4.20–5.80)
RDW: 15.4 % — ABNORMAL HIGH (ref 11.0–15.0)
Total Lymphocyte: 25.7 %
WBC: 8.5 10*3/uL (ref 3.8–10.8)

## 2023-10-28 LAB — TSH: TSH: 0.74 m[IU]/L (ref 0.40–4.50)

## 2023-11-06 ENCOUNTER — Ambulatory Visit: Admitting: Family Medicine

## 2023-11-06 ENCOUNTER — Ambulatory Visit: Payer: Self-pay

## 2023-11-06 VITALS — BP 136/80 | HR 88 | Temp 98.0°F | Ht 76.0 in | Wt >= 6400 oz

## 2023-11-06 DIAGNOSIS — K6289 Other specified diseases of anus and rectum: Secondary | ICD-10-CM | POA: Diagnosis not present

## 2023-11-06 DIAGNOSIS — R5383 Other fatigue: Secondary | ICD-10-CM | POA: Diagnosis not present

## 2023-11-06 MED ORDER — POLYETHYLENE GLYCOL 3350 17 GM/SCOOP PO POWD
17.0000 g | Freq: Every day | ORAL | 0 refills | Status: AC
Start: 2023-11-06 — End: ?

## 2023-11-06 MED ORDER — HYDROCORTISONE ACETATE 25 MG RE SUPP: 25.0000 mg | Freq: Two times a day (BID) | RECTAL | 0 refills | Status: DC

## 2023-11-06 NOTE — Progress Notes (Unsigned)
 Patient ID: [name], male    DOB: 03/05/1991, 33 y.o.   MRN: [id number]  PCP: Sowles, Krichna, MD  Chief Complaint  Patient presents with   Rectal Pain    Soreness and pain, Onset 1 day. Pain with and without bowel movements. Some blood on toilet paper    Fatigue    Weakness upon standing, onset 1 day. States he does not feel dizzy upon standing     Subjective:   [name] is a 33 y.o. male, presents to clinic with CC of the following:  HPI  Yesterday morning after a normal BM he had rectal pain and then with second bm later in the day had pain which gradually got worse with some scant blood on toilet paper when he wiped, he tried preparation H w/o improvement  and is here for eval  Also endorses feeling off/weak lightheaded when standing up no other sx, it only lasts a few seconds, resolves if he sits down  He is on HTN meds BP is slightly above goal today, no known low BP And labs checked a few days ago were normal    Patient Active Problem List   Diagnosis Date Noted   Microcytic anemia 12/09/2022   Umbilical hernia without obstruction and without gangrene 11/29/2021   Splenomegaly 11/29/2021   Fatty liver 11/29/2021   Pre-diabetes 04/12/2021   Primary hypertension 04/12/2021   COVID-19 virus infection 02/07/2019   Asthma, mild intermittent    Morbid obesity (HCC) 02/08/2008      Current Outpatient Medications:    albuterol  (VENTOLIN  HFA) 108 (90 Base) MCG/ACT inhaler, INHALE 2 PUFFS INTO THE LUNGS EVERY 4 HOURS AS NEEDED FOR WHEEZING OR SHORTNESS OF BREATH., Disp: 18 each, Rfl: 0   azelastine  (ASTELIN ) 0.1 % nasal spray, Place 2 sprays into both nostrils 2 (two) times daily. Use in each nostril as directed, Disp: 30 mL, Rfl: 2   famotidine  (PEPCID ) 40 MG tablet, Take 1 tablet (40 mg total) by mouth at bedtime., Disp: 90 tablet, Rfl: 0   fluticasone  (FLONASE ) 50 MCG/ACT nasal spray, Place 2 sprays into both nostrils daily., Disp: 16 g, Rfl: 2    ondansetron  (ZOFRAN -ODT) 4 MG disintegrating tablet, Take 1 tablet (4 mg total) by mouth every 8 (eight) hours as needed for nausea or vomiting., Disp: 20 tablet, Rfl: 0   valsartan -hydrochlorothiazide  (DIOVAN -HCT) 80-12.5 MG tablet, TAKE 1 TABLET BY MOUTH EVERY DAY, Disp: 90 tablet, Rfl: 1   Allergies  Allergen Reactions   Metformin  And Related     Nausea    Ozempic (0.25 Or 0.5 Mg-Dose) [Semaglutide (0.25 Or 0.5mg -Dos)] Nausea Only    He actually tried Rybelsus  but not on database      Social History   Tobacco Use   Smoking status: Never   Smokeless tobacco: Never  Vaping Use   Vaping status: Never Used  Substance Use Topics   Alcohol use: Yes    Alcohol/week: 1.0 standard drink of alcohol    Types: 1 Cans of beer per week   Drug use: Not Currently      Chart Review Today: I personally reviewed active problem list, medication list, allergies, family history, social history, health maintenance, notes from last encounter, lab results, imaging with the patient/caregiver today.   Review of Systems  Constitutional: Negative.   HENT: Negative.    Eyes: Negative.   Respiratory: Negative.    Cardiovascular: Negative.   Gastrointestinal: Negative.   Endocrine: Negative.   Genitourinary: Negative.   Musculoskeletal:  Negative.   Skin: Negative.   Allergic/Immunologic: Negative.   Neurological: Negative.   Hematological: Negative.   Psychiatric/Behavioral: Negative.    All other systems reviewed and are negative.      Objective:   Vitals:   11/06/23 0959  BP: 136/80  Pulse: 88  Temp: 98 F (36.7 C)  TempSrc: Oral  SpO2: 98%  Weight: (!) 492 lb (223.2 kg)  Height: 6' 4 (1.93 m)    Body mass index is 59.89 kg/m.  Physical Exam Vitals and nursing note reviewed.  Constitutional:      General: He is not in acute distress.    Appearance: Normal appearance. He is well-developed. He is obese. He is not ill-appearing, toxic-appearing or diaphoretic.  HENT:      Head: Normocephalic and atraumatic.     Right Ear: External ear normal.     Left Ear: External ear normal.     Nose: Nose normal.   Eyes:     General: No scleral icterus.       Right eye: No discharge.        Left eye: No discharge.     Conjunctiva/sclera: Conjunctivae normal.   Neck:     Trachea: No tracheal deviation.   Cardiovascular:     Rate and Rhythm: Normal rate and regular rhythm.     Pulses: Normal pulses.     Heart sounds: Normal heart sounds.     Comments: Wearing compression stockings bilaterally Pulmonary:     Effort: Pulmonary effort is normal. No respiratory distress.     Breath sounds: No stridor.   Musculoskeletal:     Right lower leg: Edema present.     Left lower leg: Edema present.   Skin:    General: Skin is warm and dry.     Findings: No rash.   Neurological:     Mental Status: He is alert.     Motor: No abnormal muscle tone.     Coordination: Coordination normal.     Gait: Gait normal.   Psychiatric:        Mood and Affect: Mood normal.        Behavior: Behavior normal.      Results for orders placed or performed in visit on 10/27/23  Lipid panel   Collection Time: 10/27/23  2:16 PM  Result Value Ref Range   Cholesterol 159 <200 mg/dL   HDL 38 (L) > OR = 40 mg/dL   Triglycerides 295 (H) <150 mg/dL   LDL Cholesterol (Calc) 95 mg/dL (calc)   Total CHOL/HDL Ratio 4.2 <5.0 (calc)   Non-HDL Cholesterol (Calc) 121 <130 mg/dL (calc)  CBC with Differential/Platelet   Collection Time: 10/27/23  2:16 PM  Result Value Ref Range   WBC 8.5 3.8 - 10.8 Thousand/uL   RBC 5.00 4.20 - 5.80 Million/uL   Hemoglobin 11.2 (L) 13.2 - 17.1 g/dL   HCT 62.1 (L) 30.8 - 65.7 %   MCV 73.6 (L) 80.0 - 100.0 fL   MCH 22.4 (L) 27.0 - 33.0 pg   MCHC 30.4 (L) 32.0 - 36.0 g/dL   RDW 84.6 (H) 96.2 - 95.2 %   Platelets 290 140 - 400 Thousand/uL   MPV 10.9 7.5 - 12.5 fL   Neutro Abs 5,602 1,500 - 7,800 cells/uL   Absolute Lymphocytes 2,185 850 - 3,900 cells/uL    Absolute Monocytes 553 200 - 950 cells/uL   Eosinophils Absolute 128 15 - 500 cells/uL   Basophils Absolute 34 0 - 200 cells/uL  Neutrophils Relative % 65.9 %   Total Lymphocyte 25.7 %   Monocytes Relative 6.5 %   Eosinophils Relative 1.5 %   Basophils Relative 0.4 %  Comprehensive metabolic panel with GFR   Collection Time: 10/27/23  2:16 PM  Result Value Ref Range   Glucose, Bld 87 65 - 99 mg/dL   BUN 17 7 - 25 mg/dL   Creat 1.61 0.96 - 0.45 mg/dL   eGFR 94 > OR = 60 WU/JWJ/1.91Y7   BUN/Creatinine Ratio SEE NOTE: 6 - 22 (calc)   Sodium 139 135 - 146 mmol/L   Potassium 4.5 3.5 - 5.3 mmol/L   Chloride 103 98 - 110 mmol/L   CO2 29 20 - 32 mmol/L   Calcium 9.8 8.6 - 10.3 mg/dL   Total Protein 7.3 6.1 - 8.1 g/dL   Albumin  4.0 3.6 - 5.1 g/dL   Globulin 3.3 1.9 - 3.7 g/dL (calc)   AG Ratio 1.2 1.0 - 2.5 (calc)   Total Bilirubin 0.6 0.2 - 1.2 mg/dL   Alkaline phosphatase (APISO) 68 36 - 130 U/L   AST 14 10 - 40 U/L   ALT 11 9 - 46 U/L  TSH   Collection Time: 10/27/23  2:16 PM  Result Value Ref Range   TSH 0.74 0.40 - 4.50 mIU/L  Hemoglobin A1c   Collection Time: 10/27/23  2:16 PM  Result Value Ref Range   Hgb A1c MFr Bld 5.9 (H) <5.7 %   Mean Plasma Glucose 123 mg/dL   eAG (mmol/L) 6.8 mmol/L       Assessment & Plan:   1. Rectal pain (Primary) Suspect some rectal trauma from BM, soften stool, continue OTC preparation H and can try steroid suppository, recommend f/up and rectal exam if not improving. With only 1 d of sx pt deferred DRE today - polyethylene glycol powder (GLYCOLAX/MIRALAX) 17 GM/SCOOP powder; Take 17 g by mouth daily.  Dispense: 3350 g; Refill: 0 - hydrocortisone (ANUSOL-HC) 25 MG suppository; Place 1 suppository (25 mg total) rectally 2 (two) times daily.  Dispense: 12 suppository; Refill: 0  2. Fatigue, unspecified type Not new, some increase in HR with orthostatics but no hypotension, may be due to morbid obesity/deconditioning, his recent labs were all  reviewed with him and no acute worsening of sx so I did not feel any repeated lab work was necessary.    Return for As needed if not improving.    Adeline Hone, PA-C 11/06/23 10:24 AM

## 2023-11-06 NOTE — Patient Instructions (Signed)
 Try softening up stool with miralax and continue to use the preparation H with the suppositories I sent in.   Please follow up if your rectal pain does not improve in the next week.  Push fluids and take time with position changes after you have been sitting for a while.   Getting exercise later when feeling well can help your heart be in shape and efficient and can improve blood pressure and limit the symptom your having when you stand up.

## 2023-11-06 NOTE — Telephone Encounter (Signed)
 FYI Only or Action Required?: FYI only for provider  Patient was last seen in primary care on 10/27/2023 by Arleen Lacer, MD. Called Nurse Triage reporting Rectal Pain. Symptoms began yesterday. Interventions attempted: OTC medications: for hemorrhoids. Symptoms are: gradually worsening.  Triage Disposition: See Physician Within 24 Hours, See PCP When Office is Open (Within 3 Days)  Patient/caregiver understands and will follow disposition?: Yes   Copied from CRM [id number]. Topic: Clinical - Red Word Triage >> Nov 06, 2023  8:06 AM Elle L wrote: Red Word that prompted transfer to Nurse Triage: The patient is having unbearable pain that he thinks may be from hemmorhoids. He is having fatigue as well. Reason for Disposition  [1] MILD weakness (i.e., does not interfere with ability to work, go to school, normal activities) AND [2] persists > 1 week  MODERATE-SEVERE rectal pain (i.e., interferes with school, work, or sleep)    Rectal pain and rectal bleeding with bowel movements  Answer Assessment - Initial Assessment Questions 1. SYMPTOM:  What's the main symptom you're concerned about? (e.g., pain, itching, swelling, rash)     Pain 7/10 and burning area 2. ONSET: When did the   start?     yesterday 3. RECTAL PAIN: Do you have any pain around your rectum? How bad is the pain?  (Scale 0-10; or mild, moderate, severe)   - NONE (0): no pain   - MILD (1-3): doesn't interfere with normal activities    - MODERATE (4-7): interferes with normal activities or awakens from sleep, limping    - SEVERE (8-10): excruciating pain, unable to have a bowel movement      Moderate 7/10 4. RECTAL ITCHING: Do you have any itching in this area? How bad is the itching?  (Scale 0-10; or mild, moderate, severe)   - NONE: no itching   - MILD: doesn't interfere with normal activities    - MODERATE-SEVERE: interferes with normal activities or awakens from sleep     none 5. CONSTIPATION: Do you have  constipation? If Yes, ask: How bad is it?     no 6. CAUSE: What do you think is causing the anus symptoms?     Possible hemorrhoids 7. OTHER SYMPTOMS: Do you have any other symptoms?  (e.g., abdomen pain, fever, rectal bleeding, vomiting)     Some rectal bleeding with bowel movements 8. PREGNANCY: Is there any chance you are pregnant? When was your last menstrual period?     N/a  Answer Assessment - Initial Assessment Questions 1. DESCRIPTION: Describe how you are feeling.     Weakness and fatigue 2. SEVERITY: How bad is it?  Can you stand and walk?   - MILD (0-3): Feels weak or tired, but does not interfere with work, school or normal activities.   - MODERATE (4-7): Able to stand and walk; weakness interferes with work, school, or normal activities.   - SEVERE (8-10): Unable to stand or walk; unable to do usual activities.     Mild to moderate 3. ONSET: When did these symptoms begin? (e.g., hours, days, weeks, months)     yesterday 4. CAUSE: What do you think is causing the weakness or fatigue? (e.g., not drinking enough fluids, medical problem, trouble sleeping)     unknown 5. NEW MEDICINES:  Have you started on any new medicines recently? (e.g., opioid pain medicines, benzodiazepines, muscle relaxants, antidepressants, antihistamines, neuroleptics, beta blockers)     no 6. OTHER SYMPTOMS: Do you have any other symptoms? (e.g., chest pain, fever,  cough, SOB, vomiting, diarrhea, bleeding, other areas of pain)     Rectal bleeding  7. PREGNANCY: Is there any chance you are pregnant? When was your last menstrual period?     N/a  Protocols used: Rectal Symptoms-A-AH, Weakness (Generalized) and Fatigue-A-AH

## 2023-11-14 DIAGNOSIS — K6289 Other specified diseases of anus and rectum: Secondary | ICD-10-CM | POA: Diagnosis not present

## 2023-11-16 ENCOUNTER — Encounter: Payer: Self-pay | Admitting: Family Medicine

## 2023-11-16 ENCOUNTER — Ambulatory Visit: Admitting: Family Medicine

## 2023-11-16 VITALS — BP 132/84 | HR 95 | Resp 16 | Ht 76.0 in | Wt >= 6400 oz

## 2023-11-16 DIAGNOSIS — K6289 Other specified diseases of anus and rectum: Secondary | ICD-10-CM | POA: Diagnosis not present

## 2023-11-16 MED ORDER — DOXYCYCLINE HYCLATE 100 MG PO TABS: 100.0000 mg | ORAL_TABLET | Freq: Two times a day (BID) | ORAL | 0 refills | Status: AC

## 2023-11-16 MED ORDER — HYDROCORTISONE ACETATE 25 MG RE SUPP: 25.0000 mg | Freq: Two times a day (BID) | RECTAL | 0 refills | Status: DC

## 2023-11-16 NOTE — Progress Notes (Signed)
 Patient ID: [name], male    DOB: Dec 30, 1990, 33 y.o.   MRN: [id number]  PCP: Sowles, Krichna, MD  Chief Complaint  Patient presents with   Rectal Pain    Still going on even after finishing suppositories     Subjective:   [name] is a 33 y.o. male, presents to clinic with CC of the following:  HPI   Rectal pain onset 6/12 he was seen in our office given stool softeners and anusol  to use, pain did not improve he went to UC and was given an antibiotic, on bactrim  date of med was 11/14/2023 - went to [facility] - said he was swollen and may have an infection  Pain is still very severe, doing warm soaks/compresses, still doing hemorrhoid meds tx  Since abx no sig improvement in pain, he had mild improvement after doing steroid suppositories  BM still soft and non-painful but increased pain after  Pt sexually active with one male  [facility] did upper GI and colonoscopy had gastritis     Patient Active Problem List   Diagnosis Date Noted   Microcytic anemia 12/09/2022   Umbilical hernia without obstruction and without gangrene 11/29/2021   Splenomegaly 11/29/2021   Fatty liver 11/29/2021   Pre-diabetes 04/12/2021   Primary hypertension 04/12/2021   COVID-19 virus infection 02/07/2019   Asthma, mild intermittent    Morbid obesity (HCC) 02/08/2008      Current Outpatient Medications:    albuterol  (VENTOLIN  HFA) 108 (90 Base) MCG/ACT inhaler, INHALE 2 PUFFS INTO THE LUNGS EVERY 4 HOURS AS NEEDED FOR WHEEZING OR SHORTNESS OF BREATH., Disp: 18 each, Rfl: 0   azelastine  (ASTELIN ) 0.1 % nasal spray, Place 2 sprays into both nostrils 2 (two) times daily. Use in each nostril as directed, Disp: 30 mL, Rfl: 2   famotidine  (PEPCID ) 40 MG tablet, Take 1 tablet (40 mg total) by mouth at bedtime., Disp: 90 tablet, Rfl: 0   fluticasone  (FLONASE ) 50 MCG/ACT nasal spray, Place 2 sprays into both nostrils daily., Disp: 16 g, Rfl: 2   ondansetron  (ZOFRAN -ODT) 4 MG disintegrating  tablet, Take 1 tablet (4 mg total) by mouth every 8 (eight) hours as needed for nausea or vomiting., Disp: 20 tablet, Rfl: 0   polyethylene glycol powder (GLYCOLAX /MIRALAX ) 17 GM/SCOOP powder, Take 17 g by mouth daily., Disp: 3350 g, Rfl: 0   sulfamethoxazole -trimethoprim  (BACTRIM  DS) 800-160 MG tablet, Take 1 tablet by mouth 2 (two) times daily., Disp: , Rfl:    valsartan -hydrochlorothiazide  (DIOVAN -HCT) 80-12.5 MG tablet, TAKE 1 TABLET BY MOUTH EVERY DAY, Disp: 90 tablet, Rfl: 1   hydrocortisone  (ANUSOL -HC) 25 MG suppository, Place 1 suppository (25 mg total) rectally 2 (two) times daily., Disp: 12 suppository, Rfl: 0   Allergies  Allergen Reactions   Metformin  And Related     Nausea    Ozempic (0.25 Or 0.5 Mg-Dose) [Semaglutide (0.25 Or 0.5mg -Dos)] Nausea Only    He actually tried Rybelsus  but not on database      Social History   Tobacco Use   Smoking status: Never   Smokeless tobacco: Never  Vaping Use   Vaping status: Never Used  Substance Use Topics   Alcohol use: Yes    Alcohol/week: 1.0 standard drink of alcohol    Types: 1 Cans of beer per week   Drug use: Not Currently      Chart Review Today: I personally reviewed active problem list, medication list, allergies, family history, social history, health maintenance, notes from last encounter,  lab results, imaging with the patient/caregiver today.   Review of Systems  Constitutional: Negative.   HENT: Negative.    Eyes: Negative.   Respiratory: Negative.    Cardiovascular: Negative.   Gastrointestinal: Negative.   Endocrine: Negative.   Genitourinary: Negative.   Musculoskeletal: Negative.   Skin: Negative.   Allergic/Immunologic: Negative.   Neurological: Negative.   Hematological: Negative.   Psychiatric/Behavioral: Negative.    All other systems reviewed and are negative.      Objective:   Vitals:   11/16/23 1118  BP: 132/84  Pulse: 95  Resp: 16  SpO2: 96%  Weight: (!) 482 lb (218.6 kg)   Height: 6' 4 (1.93 m)    Body mass index is 58.67 kg/m.  Physical Exam Vitals and nursing note reviewed. Exam conducted with a chaperone present.  Constitutional:      General: He is not in acute distress.    Appearance: Normal appearance. He is well-developed. He is obese. He is not ill-appearing, toxic-appearing or diaphoretic.  HENT:     Head: Normocephalic and atraumatic.     Right Ear: External ear normal.     Left Ear: External ear normal.     Nose: Nose normal.   Eyes:     General: No scleral icterus.       Right eye: No discharge.        Left eye: No discharge.     Conjunctiva/sclera: Conjunctivae normal.   Neck:     Trachea: No tracheal deviation.  Pulmonary:     Effort: Pulmonary effort is normal. No respiratory distress.     Breath sounds: No stridor.  Genitourinary:    Rectum: Tenderness present. No external hemorrhoid.     Comments: Some redness and swelling around anus Unable to do DRE due to pt discomfort  Skin:    General: Skin is warm and dry.     Findings: No rash.   Neurological:     Mental Status: He is alert.     Motor: No abnormal muscle tone.     Coordination: Coordination normal.     Gait: Gait normal.   Psychiatric:        Mood and Affect: Mood normal.        Behavior: Behavior normal.      Results for orders placed or performed in visit on 10/27/23  Lipid panel   Collection Time: 10/27/23  2:16 PM  Result Value Ref Range   Cholesterol 159 <200 mg/dL   HDL 38 (L) > OR = 40 mg/dL   Triglycerides 835 (H) <150 mg/dL   LDL Cholesterol (Calc) 95 mg/dL (calc)   Total CHOL/HDL Ratio 4.2 <5.0 (calc)   Non-HDL Cholesterol (Calc) 121 <130 mg/dL (calc)  CBC with Differential/Platelet   Collection Time: 10/27/23  2:16 PM  Result Value Ref Range   WBC 8.5 3.8 - 10.8 Thousand/uL   RBC 5.00 4.20 - 5.80 Million/uL   Hemoglobin 11.2 (L) 13.2 - 17.1 g/dL   HCT 63.1 (L) 61.4 - 49.9 %   MCV 73.6 (L) 80.0 - 100.0 fL   MCH 22.4 (L) 27.0 - 33.0  pg   MCHC 30.4 (L) 32.0 - 36.0 g/dL   RDW 84.5 (H) 88.9 - 84.9 %   Platelets 290 140 - 400 Thousand/uL   MPV 10.9 7.5 - 12.5 fL   Neutro Abs 5,602 1,500 - 7,800 cells/uL   Absolute Lymphocytes 2,185 850 - 3,900 cells/uL   Absolute Monocytes 553 200 - 950 cells/uL  Eosinophils Absolute 128 15 - 500 cells/uL   Basophils Absolute 34 0 - 200 cells/uL   Neutrophils Relative % 65.9 %   Total Lymphocyte 25.7 %   Monocytes Relative 6.5 %   Eosinophils Relative 1.5 %   Basophils Relative 0.4 %  Comprehensive metabolic panel with GFR   Collection Time: 10/27/23  2:16 PM  Result Value Ref Range   Glucose, Bld 87 65 - 99 mg/dL   BUN 17 7 - 25 mg/dL   Creat 8.91 9.39 - 8.73 mg/dL   eGFR 94 > OR = 60 fO/fpw/8.26f7   BUN/Creatinine Ratio SEE NOTE: 6 - 22 (calc)   Sodium 139 135 - 146 mmol/L   Potassium 4.5 3.5 - 5.3 mmol/L   Chloride 103 98 - 110 mmol/L   CO2 29 20 - 32 mmol/L   Calcium 9.8 8.6 - 10.3 mg/dL   Total Protein 7.3 6.1 - 8.1 g/dL   Albumin  4.0 3.6 - 5.1 g/dL   Globulin 3.3 1.9 - 3.7 g/dL (calc)   AG Ratio 1.2 1.0 - 2.5 (calc)   Total Bilirubin 0.6 0.2 - 1.2 mg/dL   Alkaline phosphatase (APISO) 68 36 - 130 U/L   AST 14 10 - 40 U/L   ALT 11 9 - 46 U/L  TSH   Collection Time: 10/27/23  2:16 PM  Result Value Ref Range   TSH 0.74 0.40 - 4.50 mIU/L  Hemoglobin A1c   Collection Time: 10/27/23  2:16 PM  Result Value Ref Range   Hgb A1c MFr Bld 5.9 (H) <5.7 %   Mean Plasma Glucose 123 mg/dL   eAG (mmol/L) 6.8 mmol/L       Assessment & Plan:   1. Rectal pain (Primary) No improvement in about 10 d after tx with miralax , OTC hemorrhoid ointment, rectal suppositories and then starting abx 2 d ago, more than 48 h on abx, will switch to doxy Pt examined he had a lot of moisture and fecal matter present likely causing some irritation/inflammation, encouraged him to do rinses/sitz baths after BM to help with pain and hygiene Close f/up if not feeilng better in the next 3+ days -  doxycycline (VIBRA-TABS) 100 MG tablet; Take 1 tablet (100 mg total) by mouth 2 (two) times daily for 7 days.  Dispense: 14 tablet; Refill: 0 - Ambulatory referral to Gastroenterology - hydrocortisone  (ANUSOL -HC) 25 MG suppository; Place 1 suppository (25 mg total) rectally 2 (two) times daily.  Dispense: 12 suppository; Refill: 0      Michelene Cower, PA-C 11/16/23 11:22 AM

## 2023-11-16 NOTE — Patient Instructions (Signed)
 Stop the bactrim  and start the doxycycline Keep yourself clean after each bowel movement, consider sitz baths or rinsing off with a water bottle (warm clean water). Decrease the miralax  to once daily since your stool is soft already, we don't want it too loose Continue hemorrhoid ointment and suppositories   Let me know if not improving in the next 48 hours

## 2023-12-01 ENCOUNTER — Ambulatory Visit: Payer: Self-pay | Admitting: Family Medicine

## 2023-12-01 ENCOUNTER — Encounter: Payer: Self-pay | Admitting: Family Medicine

## 2023-12-01 DIAGNOSIS — R161 Splenomegaly, not elsewhere classified: Secondary | ICD-10-CM | POA: Diagnosis not present

## 2023-12-01 DIAGNOSIS — K219 Gastro-esophageal reflux disease without esophagitis: Secondary | ICD-10-CM

## 2023-12-01 DIAGNOSIS — L910 Hypertrophic scar: Secondary | ICD-10-CM

## 2023-12-01 DIAGNOSIS — J4520 Mild intermittent asthma, uncomplicated: Secondary | ICD-10-CM | POA: Diagnosis not present

## 2023-12-01 DIAGNOSIS — J4521 Mild intermittent asthma with (acute) exacerbation: Secondary | ICD-10-CM

## 2023-12-01 DIAGNOSIS — K760 Fatty (change of) liver, not elsewhere classified: Secondary | ICD-10-CM

## 2023-12-01 DIAGNOSIS — I10 Essential (primary) hypertension: Secondary | ICD-10-CM

## 2023-12-01 DIAGNOSIS — R7303 Prediabetes: Secondary | ICD-10-CM

## 2023-12-01 DIAGNOSIS — J302 Other seasonal allergic rhinitis: Secondary | ICD-10-CM

## 2023-12-01 DIAGNOSIS — D573 Sickle-cell trait: Secondary | ICD-10-CM | POA: Diagnosis not present

## 2023-12-01 MED ORDER — FLUTICASONE PROPIONATE 50 MCG/ACT NA SUSP: 2.0000 | Freq: Every day | NASAL | 2 refills | Status: AC

## 2023-12-01 MED ORDER — ALBUTEROL SULFATE HFA 108 (90 BASE) MCG/ACT IN AERS
2.0000 | INHALATION_SPRAY | RESPIRATORY_TRACT | 0 refills | Status: AC | PRN
Start: 2023-12-01 — End: ?

## 2023-12-01 MED ORDER — VALSARTAN-HYDROCHLOROTHIAZIDE 80-12.5 MG PO TABS: 1.0000 | ORAL_TABLET | Freq: Every day | ORAL | 1 refills | Status: DC

## 2023-12-01 MED ORDER — FAMOTIDINE 40 MG PO TABS: 40.0000 mg | ORAL_TABLET | Freq: Every day | ORAL | 1 refills | Status: DC

## 2023-12-01 NOTE — Progress Notes (Signed)
 Name: [name]   MRN: [id number]    DOB: 13-Oct-1990   Date:12/01/2023       Progress Note  Subjective  Chief Complaint  Chief Complaint  Patient presents with   Medical Management of Chronic Issues   Discussed the use of AI scribe software for clinical note transcription with the patient, who gave verbal consent to proceed.  History of Present Illness [name] is a 33 year old male with morbid obesity and hypertension who presents for a follow-up visit regarding weight management.  He has a history of morbid obesity with a BMI over 50. Previously, he experienced significant nausea attributed to GLP-1 medication, which was discontinued, leading to an improvement in symptoms. Since stopping the medication, his weight has been increasing again. He is considering bariatric surgery, inspired by his sister's success, but is unsure about insurance coverage for the procedure. He is exploring options for weight management, including attending classes on bariatric surgery and considering a referral to a weight management clinic.  He has a family history of obesity, with his mother, older sister, and aunt all having undergone bariatric surgery. This familial trend influences his consideration of surgical options for weight management.  He is currently taking valsartan  HCTZ 80/12.5 mg for blood pressure. No current chest pain, palpitations, or tachycardia. He is trying to be more physically active to aid in weight management and overall health.  He has a history of hepatic steatosis and splenomegaly, identified on a CT scan in 2023. He has not yet followed up with a gastroenterologist regarding these findings but plans to address them in an upcoming appointment.  He experiences intermittent rectal pain, previously diagnosed as proctitis, for which he uses Anusol  suppositories. He has an appointment with a gastroenterologist scheduled for October to further evaluate this issue. The pain is  currently not present but tends to flare up.  He has a history of constipation, managed with MiraLAX , and reports normal bowel movements currently. He also has a history of headaches, which are currently not problematic.  He has a history of asthma, managed with albuterol  as needed, and reports no recent respiratory symptoms such as cough, wheezing, or shortness of breath.  He has a keloid on his neck for which he is seeking dermatological evaluation. He has not yet received a referral for this issue.    Patient Active Problem List   Diagnosis Date Noted   Microcytic anemia 12/09/2022   Umbilical hernia without obstruction and without gangrene 11/29/2021   Splenomegaly 11/29/2021   Fatty liver 11/29/2021   Pre-diabetes 04/12/2021   Primary hypertension 04/12/2021   COVID-19 virus infection 02/07/2019   Asthma, mild intermittent    Morbid obesity (HCC) 02/08/2008    Past Surgical History:  Procedure Laterality Date   COLONOSCOPY WITH PROPOFOL  N/A 05/23/2022   Procedure: COLONOSCOPY WITH PROPOFOL ;  Surgeon: Maryruth Ole DASEN, MD;  Location: [facility] END[facility]COPY;  Service: Endoscopy;  Laterality: N/A;   ESOPHAGOGASTRODUODENOSCOPY (EGD) WITH PROPOFOL  N/A 05/23/2022   Procedure: ESOPHAGOGASTRODUODENOSCOPY (EGD) WITH PROPOFOL ;  Surgeon: Maryruth Ole DASEN, MD;  Location: [facility] ENDOSCOPY;  Service: Endoscopy;  Laterality: N/A;    Family History  Problem Relation Age of Onset   Obesity Mother    Kidney disease Father        mass   Liver cancer Father    Obesity Sister     Social History   Tobacco Use   Smoking status: Never   Smokeless tobacco: Never  Substance Use Topics   Alcohol use:  Yes    Alcohol/week: 1.0 standard drink of alcohol    Types: 1 Cans of beer per week     Current Outpatient Medications:    albuterol  (VENTOLIN  HFA) 108 (90 Base) MCG/ACT inhaler, INHALE 2 PUFFS INTO THE LUNGS EVERY 4 HOURS AS NEEDED FOR WHEEZING OR SHORTNESS OF BREATH., Disp: 18 each, Rfl: 0    azelastine  (ASTELIN ) 0.1 % nasal spray, Place 2 sprays into both nostrils 2 (two) times daily. Use in each nostril as directed, Disp: 30 mL, Rfl: 2   famotidine  (PEPCID ) 40 MG tablet, Take 1 tablet (40 mg total) by mouth at bedtime., Disp: 90 tablet, Rfl: 0   fluticasone  (FLONASE ) 50 MCG/ACT nasal spray, Place 2 sprays into both nostrils daily., Disp: 16 g, Rfl: 2   hydrocortisone  (ANUSOL -HC) 25 MG suppository, Place 1 suppository (25 mg total) rectally 2 (two) times daily., Disp: 12 suppository, Rfl: 0   ondansetron  (ZOFRAN -ODT) 4 MG disintegrating tablet, Take 1 tablet (4 mg total) by mouth every 8 (eight) hours as needed for nausea or vomiting., Disp: 20 tablet, Rfl: 0   polyethylene glycol powder (GLYCOLAX /MIRALAX ) 17 GM/SCOOP powder, Take 17 g by mouth daily., Disp: 3350 g, Rfl: 0   valsartan -hydrochlorothiazide  (DIOVAN -HCT) 80-12.5 MG tablet, TAKE 1 TABLET BY MOUTH EVERY DAY, Disp: 90 tablet, Rfl: 1  Allergies  Allergen Reactions   Metformin  And Related     Nausea    Ozempic (0.25 Or 0.5 Mg-Dose) [Semaglutide (0.25 Or 0.5mg -Dos)] Nausea Only    He actually tried Rybelsus  but not on database     I personally reviewed active problem list, medication list, allergies with the patient/caregiver today.   ROS  Ten systems reviewed and is negative except as mentioned in HPI    Objective Physical Exam CONSTITUTIONAL: Patient appears well-developed and well-nourished. No distress. HEENT: Head atraumatic, normocephalic, neck supple. CARDIOVASCULAR: Normal rate, regular rhythm and normal heart sounds. No murmur heard. No BLE edema. PULMONARY: Effort normal and breath sounds normal. No respiratory distress. ABDOMINAL: There is no tenderness or distention. MUSCULOSKELETAL: Normal gait. Without gross motor or sensory deficit. PSYCHIATRIC: Patient has a normal mood and affect. Behavior is normal. Judgment and thought content normal. SKIN: Keloid on neck.  Vitals:   12/01/23 1426  BP:  126/82  Pulse: 94  Resp: 16  SpO2: 97%  Weight: (!) 491 lb 11.2 oz (223 kg)  Height: 6' 4 (1.93 m)    Body mass index is 59.85 kg/m.  Recent Results (from the past 2160 hours)  Lipid panel     Status: Abnormal   Collection Time: 10/27/23  2:16 PM  Result Value Ref Range   Cholesterol 159 <200 mg/dL   HDL 38 (L) > OR = 40 mg/dL   Triglycerides 835 (H) <150 mg/dL   LDL Cholesterol (Calc) 95 mg/dL (calc)    Comment: Reference range: <100 . Desirable range <100 mg/dL for primary prevention;   <70 mg/dL for patients with CHD or diabetic patients  with > or = 2 CHD risk factors. [facility] LDL-C is now calculated using the Martin-Hopkins  calculation, which is a validated novel method providing  better accuracy than the Friedewald equation in the  estimation of LDL-C.  Gladis APPLETHWAITE et al. SANDREA. 7986;689(80): 2061-2068  (http://education.[facility].com/faq/FAQ164)    Total CHOL/HDL Ratio 4.2 <5.0 (calc)   Non-HDL Cholesterol (Calc) 121 <130 mg/dL (calc)    Comment: For patients with diabetes plus 1 major ASCVD risk  factor, treating to a non-HDL-C goal of <100 mg/dL  (LDL-C of <  70 mg/dL) is considered a therapeutic  option.   CBC with Differential/Platelet     Status: Abnormal   Collection Time: 10/27/23  2:16 PM  Result Value Ref Range   WBC 8.5 3.8 - 10.8 Thousand/uL   RBC 5.00 4.20 - 5.80 Million/uL   Hemoglobin 11.2 (L) 13.2 - 17.1 g/dL   HCT 63.1 (L) 61.4 - 49.9 %   MCV 73.6 (L) 80.0 - 100.0 fL   MCH 22.4 (L) 27.0 - 33.0 pg   MCHC 30.4 (L) 32.0 - 36.0 g/dL    Comment: For adults, a slight decrease in the calculated MCHC value (in the range of 30 to 32 g/dL) is most likely not clinically significant; however, it should be interpreted with caution in correlation with other red cell parameters and the patient's clinical condition.    RDW 15.4 (H) 11.0 - 15.0 %   Platelets 290 140 - 400 Thousand/uL   MPV 10.9 7.5 - 12.5 fL   Neutro Abs 5,602 1,500 - 7,800 cells/uL    Absolute Lymphocytes 2,185 850 - 3,900 cells/uL   Absolute Monocytes 553 200 - 950 cells/uL   Eosinophils Absolute 128 15 - 500 cells/uL   Basophils Absolute 34 0 - 200 cells/uL   Neutrophils Relative % 65.9 %   Total Lymphocyte 25.7 %   Monocytes Relative 6.5 %   Eosinophils Relative 1.5 %   Basophils Relative 0.4 %  Comprehensive metabolic panel with GFR     Status: None   Collection Time: 10/27/23  2:16 PM  Result Value Ref Range   Glucose, Bld 87 65 - 99 mg/dL    Comment: .            Fasting reference interval .    BUN 17 7 - 25 mg/dL   Creat 8.91 9.39 - 8.73 mg/dL   eGFR 94 > OR = 60 fO/fpw/8.26f7   BUN/Creatinine Ratio SEE NOTE: 6 - 22 (calc)    Comment:    Not Reported: BUN and Creatinine are within    reference range. .    Sodium 139 135 - 146 mmol/L   Potassium 4.5 3.5 - 5.3 mmol/L   Chloride 103 98 - 110 mmol/L   CO2 29 20 - 32 mmol/L   Calcium 9.8 8.6 - 10.3 mg/dL   Total Protein 7.3 6.1 - 8.1 g/dL   Albumin  4.0 3.6 - 5.1 g/dL   Globulin 3.3 1.9 - 3.7 g/dL (calc)   AG Ratio 1.2 1.0 - 2.5 (calc)   Total Bilirubin 0.6 0.2 - 1.2 mg/dL   Alkaline phosphatase (APISO) 68 36 - 130 U/L   AST 14 10 - 40 U/L   ALT 11 9 - 46 U/L  TSH     Status: None   Collection Time: 10/27/23  2:16 PM  Result Value Ref Range   TSH 0.74 0.40 - 4.50 mIU/L  Hemoglobin A1c     Status: Abnormal   Collection Time: 10/27/23  2:16 PM  Result Value Ref Range   Hgb A1c MFr Bld 5.9 (H) <5.7 %    Comment: For someone without known diabetes, a hemoglobin  A1c value between 5.7% and 6.4% is consistent with prediabetes and should be confirmed with a  follow-up test. . For someone with known diabetes, a value <7% indicates that their diabetes is well controlled. A1c targets should be individualized based on duration of diabetes, age, comorbid conditions, and other considerations. . This assay result is consistent with an increased risk of diabetes. SAB[facility]  Currently, no consensus exists  regarding use of hemoglobin A1c for diagnosis of diabetes for children. .    Mean Plasma Glucose 123 mg/dL   eAG (mmol/L) 6.8 mmol/L      PHQ2/9:    12/01/2023    2:23 PM 11/06/2023   10:18 AM 10/27/2023    1:29 PM 05/29/2023    1:34 PM 03/10/2023    1:40 PM  Depression screen PHQ 2/9  Decreased Interest 0 0 0 0 0  Down, Depressed, Hopeless 0 0 0 0 0  PHQ - 2 Score 0 0 0 0 0  Altered sleeping 0 0  0 0  Tired, decreased energy 0 0  0 0  Change in appetite 0 0  0 0  Feeling bad or failure about yourself  0 0  0 0  Trouble concentrating 0 0  0 0  Moving slowly or fidgety/restless 0 0  0 0  Suicidal thoughts 0 0  0 0  PHQ-9 Score 0 0  0 0  Difficult doing work/chores Not difficult at all Not difficult at all  Not difficult at all     phq 9 is negative  Fall Risk:    12/01/2023    2:23 PM 11/06/2023   10:17 AM 10/27/2023    1:28 PM 05/29/2023    1:31 PM 03/10/2023    1:42 PM  Fall Risk   Falls in the past year? 0 0 0 0 0  Number falls in past yr: 0 0 0 0   Injury with Fall? 0 0 0 0   Risk for fall due to : No Fall Risks No Fall Risks No Fall Risks No Fall Risks No Fall Risks  Follow up Falls evaluation completed Falls evaluation completed Falls prevention discussed;Education provided;Falls evaluation completed Falls prevention discussed;Education provided;Falls evaluation completed Falls prevention discussed;Education provided;Falls evaluation completed      Assessment & Plan Morbid obesity Morbid obesity with BMI over 50. Previous GLP-1 therapy discontinued due to severe nausea. Considering bariatric surgery, inspired by sister's success. Uncertain about insurance coverage for surgery. Weight loss is crucial for overall health improvement, including fatty liver management. - Refer to weight and wellness clinic for medical weight management. - Provide information on bariatric surgery classes at [facility]. - Follow up on insurance coverage for bariatric surgery.  Hepatic  steatosis Hepatic steatosis identified on CT scan. Weight loss is crucial for management. - Discuss hepatic steatosis with GI specialist during upcoming appointment.  Splenomegaly Splenomegaly noted on CT scan. No current symptoms reported. - Discuss splenomegaly with GI specialist during upcoming appointment.  Recurrent rectal pain, possible proctitis or fissure Recurrent rectal pain with previous diagnosis of possible proctitis. Pain is currently resolved but has been severe during flares. Awaiting GI appointment for further evaluation. - Provide refills for Anusol  suppositories. - Discuss rectal pain and possible proctitis with GI specialist during upcoming appointment.  Hypertension Hypertension is well-controlled with current medication regimen. Blood pressure and heart rate are stable. - Continue valsartan  HCTZ 80/12.5 mg.  Asthma Asthma is well-managed with no recent exacerbations. - Continue albuterol  as needed. - Provide refills for albuterol .  Gastroesophageal reflux disease (GERD) GERD managed with famotidine . No recent exacerbations reported. - Continue famotidine .  Constipation Constipation managed with MiraLAX . Bowel movements are normal. - Continue MiraLAX  as needed.  Keloid of skin (neck) Keloid on neck, non-painful. Desires removal or injection treatment. - Refer to dermatologist for keloid management.

## 2023-12-02 ENCOUNTER — Encounter (INDEPENDENT_AMBULATORY_CARE_PROVIDER_SITE_OTHER): Payer: Self-pay

## 2023-12-02 ENCOUNTER — Other Ambulatory Visit: Payer: Self-pay

## 2024-02-25 DIAGNOSIS — Z114 Encounter for screening for human immunodeficiency virus [HIV]: Secondary | ICD-10-CM | POA: Diagnosis not present

## 2024-02-25 DIAGNOSIS — K629 Disease of anus and rectum, unspecified: Secondary | ICD-10-CM | POA: Diagnosis not present

## 2024-02-26 DIAGNOSIS — K629 Disease of anus and rectum, unspecified: Secondary | ICD-10-CM | POA: Diagnosis not present

## 2024-03-10 NOTE — Patient Instructions (Signed)
 Preventive Care 11-31 Years Old, Male Preventive care refers to lifestyle choices and visits with your health care provider that can promote health and wellness. Preventive care visits are also called wellness exams. What can I expect for my preventive care visit? Counseling During your preventive care visit, your health care provider may ask about your: Medical history, including: Past medical problems. Family medical history. Current health, including: Emotional well-being. Home life and relationship well-being. Sexual activity. Lifestyle, including: Alcohol, nicotine or tobacco, and drug use. Access to firearms. Diet, exercise, and sleep habits. Safety issues such as seatbelt and bike helmet use. Sunscreen use. Work and work Astronomer. Physical exam Your health care provider may check your: Height and weight. These may be used to calculate your BMI (body mass index). BMI is a measurement that tells if you are at a healthy weight. Waist circumference. This measures the distance around your waistline. This measurement also tells if you are at a healthy weight and may help predict your risk of certain diseases, such as type 2 diabetes and high blood pressure. Heart rate and blood pressure. Body temperature. Skin for abnormal spots. What immunizations do I need?  Vaccines are usually given at various ages, according to a schedule. Your health care provider will recommend vaccines for you based on your age, medical history, and lifestyle or other factors, such as travel or where you work. What tests do I need? Screening Your health care provider may recommend screening tests for certain conditions. This may include: Lipid and cholesterol levels. Diabetes screening. This is done by checking your blood sugar (glucose) after you have not eaten for a while (fasting). Hepatitis B test. Hepatitis C test. HIV (human immunodeficiency virus) test. STI (sexually transmitted infection)  testing, if you are at risk. Talk with your health care provider about your test results, treatment options, and if necessary, the need for more tests. Follow these instructions at home: Eating and drinking  Eat a healthy diet that includes fresh fruits and vegetables, whole grains, lean protein, and low-fat dairy products. Drink enough fluid to keep your urine pale yellow. Take vitamin and mineral supplements as recommended by your health care provider. Do not drink alcohol if your health care provider tells you not to drink. If you drink alcohol: Limit how much you have to 0-2 drinks a day. Know how much alcohol is in your drink. In the [place] one drink equals one 12 oz bottle of beer (355 mL), one 5 oz glass of wine (148 mL), or one 1 oz glass of hard liquor (44 mL). Lifestyle Brush your teeth every morning and night with fluoride toothpaste. Floss one time each day. Exercise for at least 30 minutes 5 or more days each week. Do not use any products that contain nicotine or tobacco. These products include cigarettes, chewing tobacco, and vaping devices, such as e-cigarettes. If you need help quitting, ask your health care provider. Do not use drugs. If you are sexually active, practice safe sex. Use a condom or other form of protection to prevent STIs. Find healthy ways to manage stress, such as: Meditation, yoga, or listening to music. Journaling. Talking to a trusted person. Spending time with friends and family. Minimize exposure to UV radiation to reduce your risk of skin cancer. Safety Always wear your seat belt while driving or riding in a vehicle. Do not drive: If you have been drinking alcohol. Do not ride with someone who has been drinking. If you have been using any mind-altering substances  or drugs. While texting. When you are tired or distracted. Wear a helmet and other protective equipment during sports activities. If you have firearms in your house, make sure you  follow all gun safety procedures. Seek help if you have been physically or sexually abused. What's next? Go to your health care provider once a year for an annual wellness visit. Ask your health care provider how often you should have your eyes and teeth checked. Stay up to date on all vaccines. This information is not intended to replace advice given to you by your health care provider. Make sure you discuss any questions you have with your health care provider. Document Revised: 11/07/2020 Document Reviewed: 11/07/2020 Elsevier Patient Education  2024 [employer].

## 2024-03-11 ENCOUNTER — Ambulatory Visit (INDEPENDENT_AMBULATORY_CARE_PROVIDER_SITE_OTHER): Payer: Self-pay | Admitting: Family Medicine

## 2024-03-11 ENCOUNTER — Other Ambulatory Visit (HOSPITAL_COMMUNITY)
Admission: RE | Admit: 2024-03-11 | Discharge: 2024-03-11 | Disposition: A | Discharge status: Home / Self Care | Source: Ambulatory Visit | Attending: Family Medicine | Admitting: Family Medicine

## 2024-03-11 VITALS — BP 126/80 | HR 94 | Resp 16 | Ht 76.0 in | Wt >= 6400 oz

## 2024-03-11 DIAGNOSIS — Z113 Encounter for screening for infections with a predominantly sexual mode of transmission: Secondary | ICD-10-CM | POA: Insufficient documentation

## 2024-03-11 DIAGNOSIS — Z0000 Encounter for general adult medical examination without abnormal findings: Secondary | ICD-10-CM | POA: Diagnosis not present

## 2024-03-11 DIAGNOSIS — Z23 Encounter for immunization: Secondary | ICD-10-CM | POA: Diagnosis not present

## 2024-03-11 NOTE — Progress Notes (Signed)
 Name: [name]   MRN: [id number]    DOB: 07-27-1990   Date:03/11/2024       Progress Note  Subjective  Chief Complaint  Chief Complaint  Patient presents with   Annual Exam   Rectal Pain    FMLA forms, pt was told from GI to be filled out by us     HPI  Patient presents for annual CPE .   Diet: trying to eat more fiber Exercise: he has been more active at work  Last Dental Exam: up to date  Last Eye Exam: up to date  Depression: phq 9 is negative    03/11/2024    1:07 PM 12/01/2023    2:23 PM 11/06/2023   10:18 AM 10/27/2023    1:29 PM 05/29/2023    1:34 PM  Depression screen PHQ 2/9  Decreased Interest 0 0 0 0 0  Down, Depressed, Hopeless 0 0 0 0 0  PHQ - 2 Score 0 0 0 0 0  Altered sleeping  0 0  0  Tired, decreased energy  0 0  0  Change in appetite  0 0  0  Feeling bad or failure about yourself   0 0  0  Trouble concentrating  0 0  0  Moving slowly or fidgety/restless  0 0  0  Suicidal thoughts  0 0  0  PHQ-9 Score  0 0  0  Difficult doing work/chores  Not difficult at all Not difficult at all  Not difficult at all    Hypertension:  BP Readings from Last 3 Encounters:  03/11/24 126/80  12/01/23 126/82  11/16/23 132/84    Obesity: Wt Readings from Last 3 Encounters:  03/11/24 (!) 493 lb (223.6 kg)  12/01/23 (!) 491 lb 11.2 oz (223 kg)  11/16/23 (!) 482 lb (218.6 kg)   BMI Readings from Last 3 Encounters:  03/11/24 60.01 kg/m  12/01/23 59.85 kg/m  11/16/23 58.67 kg/m     Flowsheet Row Office Visit from 03/11/2024 in [facility]  AUDIT-C Score 1      Single STD testing and prevention (HIV/chl/gon/syphilis):  yes Sexual history: heterosexual , uses condoms  Hep C Screening: completed Skin cancer: Discussed monitoring for atypical lesions Colorectal cancer: N/A Prostate cancer:  not applicable    ECG:  2023  Vaccines:flu shot today,  reviewed with the patient.   Advanced Care Planning: A voluntary discussion  about advance care planning including the explanation and discussion of advance directives.  Discussed health care proxy and Living will, and the patient was able to identify a health care proxy as mother .  Patient does not have a living will and power of attorney of health care   Patient Active Problem List   Diagnosis Date Noted   Microcytic anemia 12/09/2022   Umbilical hernia without obstruction and without gangrene 11/29/2021   Splenomegaly 11/29/2021   Fatty liver 11/29/2021   Pre-diabetes 04/12/2021   Primary hypertension 04/12/2021   COVID-19 virus infection 02/07/2019   Asthma, mild intermittent    Morbid obesity (HCC) 02/08/2008    Past Surgical History:  Procedure Laterality Date   COLONOSCOPY WITH PROPOFOL  N/A 05/23/2022   Procedure: COLONOSCOPY WITH PROPOFOL ;  Surgeon: Maryruth Ole DASEN, MD;  Location: [facility] ENDOSCOPY;  Service: Endoscopy;  Laterality: N/A;   ESOPHAGOGASTRODUODENOSCOPY (EGD) WITH PROPOFOL  N/A 05/23/2022   Procedure: ESOPHAGOGASTRODUODENOSCOPY (EGD) WITH PROPOFOL ;  Surgeon: Maryruth Ole DASEN, MD;  Location: [facility] ENDOSCOPY;  Service: Endoscopy;  Laterality: N/A;  Family History  Problem Relation Age of Onset   Obesity Mother    Kidney disease Father        mass   Liver cancer Father    Obesity Sister     Social History   Socioeconomic History   Marital status: Single    Spouse name: Not on file   Number of children: 0   Years of education: Not on file   Highest education level: Some college, no degree  Occupational History   Occupation: Management consultant   Tobacco Use   Smoking status: Never   Smokeless tobacco: Never  Vaping Use   Vaping status: Never Used  Substance and Sexual Activity   Alcohol use: Yes    Alcohol/week: 1.0 standard drink of alcohol    Types: 1 Cans of beer per week   Drug use: Not Currently   Sexual activity: Yes    Partners: Female    Birth control/protection: Condom    Comment: not always   Other  Topics Concern   Not on file  Social History Narrative   Not on file   Social Drivers of Health   Financial Resource Strain: Low Risk  (03/11/2024)   Overall Financial Resource Strain (CARDIA)    Difficulty of Paying Living Expenses: Not hard at all  Food Insecurity: No Food Insecurity (03/11/2024)   Hunger Vital Sign    Worried About Running Out of Food in the Last Year: Never true    Ran Out of Food in the Last Year: Never true  Transportation Needs: No Transportation Needs (03/11/2024)   PRAPARE - Administrator, Civil Service (Medical): No    Lack of Transportation (Non-Medical): No  Physical Activity: Sufficiently Active (03/11/2024)   Exercise Vital Sign    Days of Exercise per Week: 5 days    Minutes of Exercise per Session: 30 min  Stress: No Stress Concern Present (03/11/2024)   [employer] of Occupational Health - Occupational Stress Questionnaire    Feeling of Stress: Not at all  Social Connections: Moderately Integrated (03/11/2024)   Social Connection and Isolation Panel    Frequency of Communication with Friends and Family: More than three times a week    Frequency of Social Gatherings with Friends and Family: More than three times a week    Attends Religious Services: 1 to 4 times per year    Active Member of [employer] or Organizations: Yes    Attends Banker Meetings: 1 to 4 times per year    Marital Status: Never married  Intimate Partner Violence: Not At Risk (03/11/2024)   Humiliation, Afraid, Rape, and Kick questionnaire    Fear of Current or Ex-Partner: No    Emotionally Abused: No    Physically Abused: No    Sexually Abused: No     Current Outpatient Medications:    albuterol  (VENTOLIN  HFA) 108 (90 Base) MCG/ACT inhaler, Inhale 2 puffs into the lungs every 4 (four) hours as needed for wheezing or shortness of breath., Disp: 18 each, Rfl: 0   azelastine  (ASTELIN ) 0.1 % nasal spray, Place 2 sprays into both nostrils 2 (two)  times daily. Use in each nostril as directed, Disp: 30 mL, Rfl: 2   ciprofloxacin (CIPRO) 500 MG tablet, Take 500 mg by mouth., Disp: , Rfl:    famotidine  (PEPCID ) 40 MG tablet, Take 1 tablet (40 mg total) by mouth at bedtime., Disp: 90 tablet, Rfl: 1   fluticasone  (FLONASE ) 50 MCG/ACT nasal spray,  Place 2 sprays into both nostrils daily., Disp: 16 g, Rfl: 2   hydrocortisone  (ANUSOL -HC) 25 MG suppository, Place 1 suppository (25 mg total) rectally 2 (two) times daily., Disp: 12 suppository, Rfl: 0   metroNIDAZOLE (FLAGYL) 500 MG tablet, Take 500 mg by mouth., Disp: , Rfl:    ondansetron  (ZOFRAN -ODT) 4 MG disintegrating tablet, Take 1 tablet (4 mg total) by mouth every 8 (eight) hours as needed for nausea or vomiting., Disp: 20 tablet, Rfl: 0   polyethylene glycol powder (GLYCOLAX /MIRALAX ) 17 GM/SCOOP powder, Take 17 g by mouth daily., Disp: 3350 g, Rfl: 0   valsartan -hydrochlorothiazide  (DIOVAN -HCT) 80-12.5 MG tablet, Take 1 tablet by mouth daily., Disp: 90 tablet, Rfl: 1  Allergies  Allergen Reactions   Metformin  And Related     Nausea    Ozempic (0.25 Or 0.5 Mg-Dose) [Semaglutide (0.25 Or 0.5mg -Dos)] Nausea Only    He actually tried Rybelsus  but not on database      ROS  Ten systems reviewed and is negative except as mentioned in HPI     Objective  Vitals:   03/11/24 1311  BP: 126/80  Pulse: 94  Resp: 16  SpO2: 97%  Weight: (!) 493 lb (223.6 kg)  Height: 6' 4 (1.93 m)    Body mass index is 60.01 kg/m.  Physical Exam  Constitutional: Patient appears well-developed and well-nourished. No distress.  HENT: Head: Normocephalic and atraumatic. Ears: B TMs ok, no erythema or effusion; Nose: Nose normal. Mouth/Throat: Oropharynx is clear and moist. No oropharyngeal exudate.  Eyes: Conjunctivae and EOM are normal. Pupils are equal, round, and reactive to light. No scleral icterus.  Neck: Normal range of motion. Neck supple. No JVD present. No thyromegaly present.   Cardiovascular: Normal rate, regular rhythm and normal heart sounds.  No murmur heard. No BLE edema. Pulmonary/Chest: Effort normal and breath sounds normal. No respiratory distress. Abdominal: Soft. Bowel sounds are normal, no distension. There is no tenderness. no masses MALE GENITALIA: Normal descended testes bilaterally, no masses palpated, no hernias, no lesions, no discharge RECTAL: not done  Musculoskeletal: Normal range of motion, no joint effusions. No gross deformities Neurological: he is alert and oriented to person, place, and time. No cranial nerve deficit. Coordination, balance, strength, speech and gait are normal.  Skin: Skin is warm and dry. No rash noted. No erythema.  Psychiatric: Patient has a normal mood and affect. behavior is normal. Judgment and thought content normal.     Assessment & Plan  1. Well adult exam (Primary)   2. Need for influenza vaccination  - Flu vaccine trivalent PF, 6mos and older(Flulaval,Afluria,Fluarix,Fluzone)  3. Screening examination for STI  - GC Probe amplification, urine     -Prostate cancer screening and PSA options (with potential risks and benefits of testing vs not testing) were discussed along with recent recs/guidelines. -USPSTF grade A and B recommendations reviewed with patient; age-appropriate recommendations, preventive care, screening tests, etc discussed and encouraged; healthy living encouraged; see AVS for patient education given to patient -Discussed importance of 150 minutes of physical activity weekly, eat two servings of fish weekly, eat one serving of tree nuts ( cashews, pistachios, pecans, almonds.SABRA) every other day, eat 6 servings of fruit/vegetables daily and drink plenty of water and avoid sweet beverages.  -Reviewed Health Maintenance: yes

## 2024-03-15 ENCOUNTER — Encounter: Payer: Self-pay | Admitting: Family Medicine

## 2024-03-15 ENCOUNTER — Ambulatory Visit: Admitting: Family Medicine

## 2024-03-15 VITALS — BP 124/80 | HR 96 | Resp 16 | Ht 76.0 in | Wt >= 6400 oz

## 2024-03-15 DIAGNOSIS — K603 Anal fistula, unspecified: Secondary | ICD-10-CM | POA: Diagnosis not present

## 2024-03-15 LAB — URINE CYTOLOGY ANCILLARY ONLY
Chlamydia: NEGATIVE
Comment: NEGATIVE
Comment: NORMAL
Neisseria Gonorrhea: NEGATIVE

## 2024-03-15 NOTE — Progress Notes (Signed)
 Name: [name]   MRN: [id number]    DOB: 1990/11/09   Date:03/15/2024       Progress Note  Subjective  Chief Complaint  Chief Complaint  Patient presents with   fmla   Rectal Pain    3 months ago, seeing GI     Discussed the use of AI scribe software for clinical note transcription with the patient, who gave verbal consent to proceed.  History of Present Illness [name] is a 33 year old male who presents for FMLA paperwork due to rectal pain from an anal fistula.  He has been experiencing rectal pain since November 13, 2023, which began suddenly and led to an urgent care visit the following day. Initial treatment with antibiotics for a suspected infection was ineffective. Subsequent visits to another physician led to a change in antibiotics, which also failed to provide relief.  He was eventually referred to a gastroenterologist who identified a perianal lesion and referred him to a surgeon. The surgeon told him that he believed it was an anal fistula and recommended a referral to a colorectal surgeon, with an appointment scheduled for March 28, 2024.  He has been working from home, which allowed him to manage his symptoms more effectively due to easy access to a bathroom. However, since returning to the office on February 29, 2024, his symptoms have worsened, making it difficult to manage pain and bathroom needs during work hours. He describes his pain as severe, reaching a level of 9 out of 10 after bowel movements, and notes that it significantly impacts his ability to concentrate and perform daily activities.  He reports bleeding with every bowel movement. No fever or chills. His current medications include metronidazole and ciprofloxacin. 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    Patient Active Problem List   Diagnosis Date Noted   Microcytic anemia 12/09/2022   Umbilical hernia without obstruction and without  gangrene 11/29/2021   Splenomegaly 11/29/2021   Fatty liver 11/29/2021   Pre-diabetes 04/12/2021   Primary hypertension 04/12/2021   COVID-19 virus infection 02/07/2019   Asthma, mild intermittent    Morbid obesity (HCC) 02/08/2008    Social History   Tobacco Use   Smoking status: Never   Smokeless tobacco: Never  Substance Use Topics   Alcohol use: Yes    Alcohol/week: 1.0 standard drink of alcohol    Types: 1 Cans of beer per week     Current Outpatient Medications:    albuterol  (VENTOLIN  HFA) 108 (90 Base) MCG/ACT inhaler, Inhale 2 puffs into the lungs every 4 (four) hours as needed for wheezing or shortness of breath., Disp: 18 each, Rfl: 0   azelastine  (ASTELIN ) 0.1 % nasal spray, Place 2 sprays into both nostrils 2 (two) times daily. Use in each nostril as directed, Disp: 30 mL, Rfl: 2   ciprofloxacin (CIPRO) 500 MG tablet, Take 500 mg by mouth., Disp: , Rfl:    famotidine  (PEPCID ) 40 MG tablet, Take 1 tablet (40 mg total) by mouth at bedtime., Disp: 90 tablet, Rfl: 1   fluticasone  (FLONASE ) 50 MCG/ACT nasal spray, Place 2 sprays into both nostrils daily., Disp: 16 g, Rfl: 2   hydrocortisone  (ANUSOL -HC) 25 MG suppository, Place 1 suppository (25 mg total) rectally 2 (two) times daily., Disp: 12 suppository, Rfl: 0   metroNIDAZOLE (FLAGYL) 500 MG tablet, Take 500 mg by mouth., Disp: , Rfl:    ondansetron  (ZOFRAN -ODT) 4 MG disintegrating  tablet, Take 1 tablet (4 mg total) by mouth every 8 (eight) hours as needed for nausea or vomiting., Disp: 20 tablet, Rfl: 0   polyethylene glycol powder (GLYCOLAX /MIRALAX ) 17 GM/SCOOP powder, Take 17 g by mouth daily., Disp: 3350 g, Rfl: 0   valsartan -hydrochlorothiazide  (DIOVAN -HCT) 80-12.5 MG tablet, Take 1 tablet by mouth daily., Disp: 90 tablet, Rfl: 1  Allergies  Allergen Reactions   Metformin  And Related     Nausea    Ozempic (0.25 Or 0.5 Mg-Dose) [Semaglutide (0.25 Or 0.5mg -Dos)] Nausea Only    He actually tried Rybelsus  but not on  database     ROS  Ten systems reviewed and is negative except as mentioned in HPI    Objective  Vitals:   03/15/24 1510  BP: 124/80  Pulse: 96  Resp: 16  SpO2: 99%  Weight: (!) 493 lb (223.6 kg)  Height: 6' 4 (1.93 m)    Body mass index is 60.01 kg/m.  Physical Exam CONSTITUTIONAL: Patient appears well-developed and well-nourished.  No distress. HEENT: Head atraumatic, normocephalic, neck supple. CARDIOVASCULAR: Normal rate, regular rhythm and normal heart sounds.  No murmur heard. No BLE edema. PULMONARY: Effort normal and breath sounds normal. No respiratory distress. ABDOMINAL: There is no tenderness or distention. MUSCULOSKELETAL: Normal gait. Without gross motor or sensory deficit. PSYCHIATRIC: Patient has a normal mood and affect. behavior is normal. Judgment and thought content normal.  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 Previous antibiotics ineffective. Differential includes Crohn's disease. Awaiting colorectal surgeon evaluation. - Refer to colorectal surgeon for evaluation on March 28, 2024. - Advise increased fiber intake and hydration. - Instruct to avoid straining during bowel movements. - Complete FMLA paperwork for 6 to 9 months of potential work absences.

## 2024-03-15 NOTE — H&P (View-Only) (Signed)
 Name: [name]   MRN: [id number]    DOB: 1990/11/09   Date:03/15/2024       Progress Note  Subjective  Chief Complaint  Chief Complaint  Patient presents with   fmla   Rectal Pain    3 months ago, seeing GI     Discussed the use of AI scribe software for clinical note transcription with the patient, who gave verbal consent to proceed.  History of Present Illness [name] is a 33 year old male who presents for FMLA paperwork due to rectal pain from an anal fistula.  He has been experiencing rectal pain since November 13, 2023, which began suddenly and led to an urgent care visit the following day. Initial treatment with antibiotics for a suspected infection was ineffective. Subsequent visits to another physician led to a change in antibiotics, which also failed to provide relief.  He was eventually referred to a gastroenterologist who identified a perianal lesion and referred him to a surgeon. The surgeon told him that he believed it was an anal fistula and recommended a referral to a colorectal surgeon, with an appointment scheduled for March 28, 2024.  He has been working from home, which allowed him to manage his symptoms more effectively due to easy access to a bathroom. However, since returning to the office on February 29, 2024, his symptoms have worsened, making it difficult to manage pain and bathroom needs during work hours. He describes his pain as severe, reaching a level of 9 out of 10 after bowel movements, and notes that it significantly impacts his ability to concentrate and perform daily activities.  He reports bleeding with every bowel movement. No fever or chills. His current medications include metronidazole and ciprofloxacin. 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    Patient Active Problem List   Diagnosis Date Noted   Microcytic anemia 12/09/2022   Umbilical hernia without obstruction and without  gangrene 11/29/2021   Splenomegaly 11/29/2021   Fatty liver 11/29/2021   Pre-diabetes 04/12/2021   Primary hypertension 04/12/2021   COVID-19 virus infection 02/07/2019   Asthma, mild intermittent    Morbid obesity (HCC) 02/08/2008    Social History   Tobacco Use   Smoking status: Never   Smokeless tobacco: Never  Substance Use Topics   Alcohol use: Yes    Alcohol/week: 1.0 standard drink of alcohol    Types: 1 Cans of beer per week     Current Outpatient Medications:    albuterol  (VENTOLIN  HFA) 108 (90 Base) MCG/ACT inhaler, Inhale 2 puffs into the lungs every 4 (four) hours as needed for wheezing or shortness of breath., Disp: 18 each, Rfl: 0   azelastine  (ASTELIN ) 0.1 % nasal spray, Place 2 sprays into both nostrils 2 (two) times daily. Use in each nostril as directed, Disp: 30 mL, Rfl: 2   ciprofloxacin (CIPRO) 500 MG tablet, Take 500 mg by mouth., Disp: , Rfl:    famotidine  (PEPCID ) 40 MG tablet, Take 1 tablet (40 mg total) by mouth at bedtime., Disp: 90 tablet, Rfl: 1   fluticasone  (FLONASE ) 50 MCG/ACT nasal spray, Place 2 sprays into both nostrils daily., Disp: 16 g, Rfl: 2   hydrocortisone  (ANUSOL -HC) 25 MG suppository, Place 1 suppository (25 mg total) rectally 2 (two) times daily., Disp: 12 suppository, Rfl: 0   metroNIDAZOLE (FLAGYL) 500 MG tablet, Take 500 mg by mouth., Disp: , Rfl:    ondansetron  (ZOFRAN -ODT) 4 MG disintegrating  tablet, Take 1 tablet (4 mg total) by mouth every 8 (eight) hours as needed for nausea or vomiting., Disp: 20 tablet, Rfl: 0   polyethylene glycol powder (GLYCOLAX /MIRALAX ) 17 GM/SCOOP powder, Take 17 g by mouth daily., Disp: 3350 g, Rfl: 0   valsartan -hydrochlorothiazide  (DIOVAN -HCT) 80-12.5 MG tablet, Take 1 tablet by mouth daily., Disp: 90 tablet, Rfl: 1  Allergies  Allergen Reactions   Metformin  And Related     Nausea    Ozempic (0.25 Or 0.5 Mg-Dose) [Semaglutide (0.25 Or 0.5mg -Dos)] Nausea Only    He actually tried Rybelsus  but not on  database     ROS  Ten systems reviewed and is negative except as mentioned in HPI    Objective  Vitals:   03/15/24 1510  BP: 124/80  Pulse: 96  Resp: 16  SpO2: 99%  Weight: (!) 493 lb (223.6 kg)  Height: 6' 4 (1.93 m)    Body mass index is 60.01 kg/m.  Physical Exam CONSTITUTIONAL: Patient appears well-developed and well-nourished.  No distress. HEENT: Head atraumatic, normocephalic, neck supple. CARDIOVASCULAR: Normal rate, regular rhythm and normal heart sounds.  No murmur heard. No BLE edema. PULMONARY: Effort normal and breath sounds normal. No respiratory distress. ABDOMINAL: There is no tenderness or distention. MUSCULOSKELETAL: Normal gait. Without gross motor or sensory deficit. PSYCHIATRIC: Patient has a normal mood and affect. behavior is normal. Judgment and thought content normal.  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 Previous antibiotics ineffective. Differential includes Crohn's disease. Awaiting colorectal surgeon evaluation. - Refer to colorectal surgeon for evaluation on March 28, 2024. - Advise increased fiber intake and hydration. - Instruct to avoid straining during bowel movements. - Complete FMLA paperwork for 6 to 9 months of potential work absences.

## 2024-03-28 ENCOUNTER — Ambulatory Visit: Payer: Self-pay | Admitting: Surgery

## 2024-03-28 DIAGNOSIS — K621 Rectal polyp: Secondary | ICD-10-CM | POA: Diagnosis not present

## 2024-03-28 DIAGNOSIS — Z6841 Body Mass Index (BMI) 40.0 and over, adult: Secondary | ICD-10-CM | POA: Diagnosis not present

## 2024-04-07 NOTE — Progress Notes (Signed)
 Date of COVID positive in last 90 days:  PCP - Dorette Loron, MD Cardiologist - n/a  Chest x-ray - N/A EKG - 04/08/24 Epic/chart Stress Test - N/A ECHO - 02/10/14 Epic  Cardiac Cath - N/A Pacemaker/ICD device last checked:N/A Spinal Cord Stimulator:N/A  Bowel Prep - day before all liquid duet0 Miralax . Patient aware and has instructions   Sleep Study - N/A CPAP -   Fasting Blood Sugar - preDM Checks Blood Sugar no meds or checks at home  Last dose of GLP1 agonist-  N/A GLP1 instructions:  Do not take after     Last dose of SGLT-2 inhibitors-  N/A SGLT-2 instructions:  Do not take after     Blood Thinner Instructions: N/A Last dose:   Time: Aspirin Instructions:N/A Last Dose:  Activity level: Can go up a flight of stairs and perform activities of daily living without stopping and without symptoms of chest pain or shortness of breath.   Anesthesia review: N/A  Patient denies shortness of breath, fever, cough and chest pain at PAT appointment  Patient verbalized understanding of instructions that were given to them at the PAT appointment. Patient was also instructed that they will need to review over the PAT instructions again at home before surgery.

## 2024-04-07 NOTE — Patient Instructions (Signed)
 SURGICAL WAITING ROOM VISITATION  Patients having surgery or a procedure may have no more than 2 support people in the waiting area - these visitors may rotate.    Children under the age of 64 must have an adult with them who is not the patient.  Visitors with respiratory illnesses are discouraged from visiting and should remain at home.  If the patient needs to stay at the hospital during part of their recovery, the visitor guidelines for inpatient rooms apply. Pre-op nurse will coordinate an appropriate time for 1 support person to accompany patient in pre-op.  This support person may not rotate.    Please refer to the [facility] website for the visitor guidelines for Inpatients (after your surgery is over and you are in a regular room).    Your procedure is scheduled on: 04/14/24   Report to [facility] Main Entrance    Report to admitting at 9:15 AM   Call this number if you have problems the morning of surgery [phone]   Follow a liquid diet the day before surgery.  Water Non-Citrus Juices (without pulp, NO RED-Apple, White grape, White cranberry) Black Coffee (NO MILK/CREAM OR CREAMERS, sugar ok)  Clear Tea (NO MILK/CREAM OR CREAMERS, sugar ok) regular and decaf                             Plain Jell-O (NO RED)                                           Fruit ices (not with fruit pulp, NO RED)                                     Popsicles (NO RED)                                                               Sports drinks like Gatorade (NO RED)              Nothing to drink after midnight.           If you have questions, please contact your surgeon's office.   FOLLOW BOWEL PREP AND ANY ADDITIONAL PRE OP INSTRUCTIONS YOU RECEIVED FROM YOUR SURGEON'S OFFICE!!!     Oral Hygiene is also important to reduce your risk of infection.                                    Remember - BRUSH YOUR TEETH THE MORNING OF SURGERY WITH YOUR REGULAR TOOTHPASTE  DENTURES WILL  BE REMOVED PRIOR TO SURGERY PLEASE DO NOT APPLY Poly grip OR ADHESIVES!!!   Stop all vitamins and herbal supplements 7 days before surgery.   Take these medicines the morning of surgery with A SIP OF WATER: Inhaler                              You may not have  any metal on your body including jewelry, and body piercing             Do not wear lotions, powders, cologne, or deodorant              Men may shave face and neck.   Do not bring valuables to the hospital. [facility] [place] IS NOT             RESPONSIBLE   FOR VALUABLES.   Contacts, glasses, dentures or bridgework may not be worn into surgery.  DO NOT BRING YOUR HOME MEDICATIONS TO THE HOSPITAL. PHARMACY WILL DISPENSE MEDICATIONS LISTED ON YOUR MEDICATION LIST TO YOU DURING YOUR ADMISSION IN THE HOSPITAL!    Patients discharged on the day of surgery will not be allowed to drive home.  Someone NEEDS to stay with you for the first 24 hours after anesthesia.              Please read over the following fact sheets you were given: IF YOU HAVE QUESTIONS ABOUT YOUR PRE-OP INSTRUCTIONS PLEASE CALL [phone] [place].   If you received a COVID test during your pre-op visit  it is requested that you wear a mask when out in public, stay away from anyone that may not be feeling well and notify your surgeon if you develop symptoms. If you test positive for Covid or have been in contact with anyone that has tested positive in the last 10 days please notify you surgeon.     - Preparing for Surgery Before surgery, you can play an important role.  Because skin is not sterile, your skin needs to be as free of germs as possible.  You can reduce the number of germs on your skin by washing with CHG (chlorahexidine gluconate) soap before surgery.  CHG is an antiseptic cleaner which kills germs and bonds with the skin to continue killing germs even after washing. Please DO NOT use if you have an allergy to CHG or antibacterial soaps.  If your  skin becomes reddened/irritated stop using the CHG and inform your nurse when you arrive at Short Stay. Do not shave (including legs and underarms) for at least 48 hours prior to the first CHG shower.  You may shave your face/neck.  Please follow these instructions carefully:  1.  Shower with CHG Soap the night before surgery ONLY (DO NOT USE THE SOAP THE MORNING OF SURGERY).  2.  If you choose to wash your hair, wash your hair first as usual with your normal  shampoo.  3.  After you shampoo, rinse your hair and body thoroughly to remove the shampoo.                             4.  Use CHG as you would any other liquid soap.  You can apply chg directly to the skin and wash.  Gently with a scrungie or clean washcloth.  5.  Apply the CHG Soap to your body ONLY FROM THE NECK DOWN.   Do   not use on face/ open                           Wound or open sores. Avoid contact with eyes, ears mouth and   genitals (private parts).                       Wash  face,  Genitals (private parts) with your normal soap.             6.  Wash thoroughly, paying special attention to the area where your    surgery  will be performed.  7.  Thoroughly rinse your body with warm water from the neck down.  8.  DO NOT shower/wash with your normal soap after using and rinsing off the CHG Soap.                9.  Pat yourself dry with a clean towel.            10.  Wear clean pajamas.            11.  Place clean sheets on your bed the night of your first shower and do not  sleep with pets. Day of Surgery : Do not apply any CHG, lotions/deodorants the morning of surgery.  Please wear clean clothes to the hospital/surgery center.  FAILURE TO FOLLOW THESE INSTRUCTIONS MAY RESULT IN THE CANCELLATION OF YOUR SURGERY  PATIENT SIGNATURE_________________________________  NURSE SIGNATURE__________________________________  ________________________________________________________________________

## 2024-04-08 ENCOUNTER — Encounter (HOSPITAL_COMMUNITY)
Admission: RE | Admit: 2024-04-08 | Discharge: 2024-04-08 | Disposition: A | Discharge status: Home / Self Care | Source: Ambulatory Visit | Attending: Surgery | Admitting: Surgery

## 2024-04-08 ENCOUNTER — Encounter (HOSPITAL_COMMUNITY): Payer: Self-pay

## 2024-04-08 ENCOUNTER — Other Ambulatory Visit: Payer: Self-pay

## 2024-04-08 VITALS — BP 155/97 | HR 96 | Temp 97.7°F | Resp 18 | Ht 76.0 in | Wt >= 6400 oz

## 2024-04-08 DIAGNOSIS — I10 Essential (primary) hypertension: Secondary | ICD-10-CM | POA: Diagnosis not present

## 2024-04-08 DIAGNOSIS — Z01818 Encounter for other preprocedural examination: Secondary | ICD-10-CM | POA: Diagnosis present

## 2024-04-08 HISTORY — DX: Prediabetes: R73.03

## 2024-04-08 HISTORY — DX: Sickle-cell trait: D57.3

## 2024-04-08 LAB — BASIC METABOLIC PANEL WITH GFR
Anion gap: 10 (ref 5–15)
BUN: 18 mg/dL (ref 6–20)
CO2: 26 mmol/L (ref 22–32)
Calcium: 9.4 mg/dL (ref 8.9–10.3)
Chloride: 103 mmol/L (ref 98–111)
Creatinine, Ser: 0.82 mg/dL (ref 0.61–1.24)
GFR, Estimated: >60 mL/min (ref >60)
Glucose, Bld: 88 mg/dL (ref 70–99)
Potassium: 4 mmol/L (ref 3.5–5.1)
Sodium: 140 mmol/L (ref 135–145)

## 2024-04-08 LAB — CBC
HCT: 36.9 % — ABNORMAL LOW (ref 39.0–52.0)
Hemoglobin: 11.7 g/dL — ABNORMAL LOW (ref 13.0–17.0)
MCH: 23.4 pg — ABNORMAL LOW (ref 26.0–34.0)
MCHC: 31.7 g/dL (ref 30.0–36.0)
MCV: 73.8 fL — ABNORMAL LOW (ref 80.0–100.0)
Platelets: 250 K/uL (ref 150–400)
RBC: 5 MIL/uL (ref 4.22–5.81)
RDW: 14.6 % (ref 11.5–15.5)
WBC: 8.4 K/uL (ref 4.0–10.5)
nRBC: 0 % (ref 0.0–0.2)

## 2024-04-14 ENCOUNTER — Ambulatory Visit (HOSPITAL_COMMUNITY): Payer: Self-pay | Admitting: Physician Assistant

## 2024-04-14 ENCOUNTER — Ambulatory Visit (HOSPITAL_COMMUNITY)
Admission: RE | Admit: 2024-04-14 | Discharge: 2024-04-14 | Disposition: A | Discharge status: Home / Self Care | Source: Ambulatory Visit | Attending: Surgery | Admitting: Surgery

## 2024-04-14 ENCOUNTER — Ambulatory Visit (HOSPITAL_COMMUNITY): Admitting: Anesthesiology

## 2024-04-14 ENCOUNTER — Other Ambulatory Visit: Payer: Self-pay

## 2024-04-14 ENCOUNTER — Encounter (HOSPITAL_COMMUNITY): Payer: Self-pay | Admitting: Surgery

## 2024-04-14 ENCOUNTER — Encounter (HOSPITAL_COMMUNITY)
Admission: RE | Disposition: A | Discharge status: Home / Self Care | Payer: Self-pay | Source: Ambulatory Visit | Attending: Surgery

## 2024-04-14 DIAGNOSIS — K603 Anal fistula, unspecified: Secondary | ICD-10-CM | POA: Insufficient documentation

## 2024-04-14 DIAGNOSIS — E6689 Other obesity not elsewhere classified: Secondary | ICD-10-CM | POA: Diagnosis not present

## 2024-04-14 DIAGNOSIS — I10 Essential (primary) hypertension: Secondary | ICD-10-CM | POA: Insufficient documentation

## 2024-04-14 DIAGNOSIS — K641 Second degree hemorrhoids: Secondary | ICD-10-CM | POA: Insufficient documentation

## 2024-04-14 DIAGNOSIS — Z6841 Body Mass Index (BMI) 40.0 and over, adult: Secondary | ICD-10-CM | POA: Diagnosis not present

## 2024-04-14 HISTORY — PX: RECTAL EXAM UNDER ANESTHESIA: SHX6399

## 2024-04-14 HISTORY — PX: HEMORRHOID BANDING: SHX5850

## 2024-04-14 HISTORY — PX: ANAL FISTULOTOMY: SHX6423

## 2024-04-14 SURGERY — ANAL FISTULOTOMY
Anesthesia: General | Site: Rectum

## 2024-04-14 MED ORDER — 0.9 % SODIUM CHLORIDE (POUR BTL) OPTIME
TOPICAL | Status: DC | PRN
  Administered 2024-04-14: 1000 mL

## 2024-04-14 MED ORDER — FENTANYL CITRATE (PF) 100 MCG/2ML IJ SOLN
INTRAMUSCULAR | Status: AC
  Filled 2024-04-14: qty 2

## 2024-04-14 MED ORDER — SODIUM CHLORIDE 0.9 % IV SOLN
2.0000 g | INTRAVENOUS | Status: AC
  Administered 2024-04-14: 2 g via INTRAVENOUS
  Filled 2024-04-14: qty 20

## 2024-04-14 MED ORDER — METHYLENE BLUE 20 MG/2ML IV SOSY
PREFILLED_SYRINGE | INTRAVENOUS | Status: DC | PRN
  Administered 2024-04-14: 2 mL via SUBMUCOSAL

## 2024-04-14 MED ORDER — OXYCODONE-ACETAMINOPHEN 5-325 MG PO TABS: 1.0000 | ORAL_TABLET | Freq: Four times a day (QID) | ORAL | 0 refills | Status: DC | PRN

## 2024-04-14 MED ORDER — BUPIVACAINE LIPOSOME 1.3 % IJ SUSP
INTRAMUSCULAR | Status: AC
  Filled 2024-04-14: qty 20

## 2024-04-14 MED ORDER — LIDOCAINE HCL (PF) 2 % IJ SOLN
INTRAMUSCULAR | Status: AC
  Filled 2024-04-14: qty 5

## 2024-04-14 MED ORDER — ONDANSETRON HCL 4 MG/2ML IJ SOLN
INTRAMUSCULAR | Status: AC
  Filled 2024-04-14: qty 2

## 2024-04-14 MED ORDER — OXYCODONE HCL 5 MG PO TABS: 5.0000 mg | ORAL_TABLET | Freq: Once | ORAL | Status: DC | PRN

## 2024-04-14 MED ORDER — PROPOFOL 10 MG/ML IV BOLUS
INTRAVENOUS | Status: DC | PRN
Start: 2024-04-14 — End: 2024-04-14
  Administered 2024-04-14: 300 mg via INTRAVENOUS
  Administered 2024-04-14: 40 mg via INTRAVENOUS

## 2024-04-14 MED ORDER — MIDAZOLAM HCL 2 MG/2ML IJ SOLN
INTRAMUSCULAR | Status: AC
  Filled 2024-04-14: qty 2

## 2024-04-14 MED ORDER — DIBUCAINE (PERIANAL) 1 % EX OINT
TOPICAL_OINTMENT | CUTANEOUS | Status: DC | PRN
  Administered 2024-04-14: 1 via RECTAL

## 2024-04-14 MED ORDER — BUPIVACAINE LIPOSOME 1.3 % IJ SUSP: 20.0000 mL | Freq: Once | INTRAMUSCULAR | Status: DC

## 2024-04-14 MED ORDER — DEXAMETHASONE SODIUM PHOSPHATE 4 MG/ML IJ SOLN
INTRAMUSCULAR | Status: DC | PRN
  Administered 2024-04-14: 8 mg via INTRAVENOUS

## 2024-04-14 MED ORDER — LIDOCAINE HCL (CARDIAC) PF 100 MG/5ML IV SOSY
PREFILLED_SYRINGE | INTRAVENOUS | Status: DC | PRN
  Administered 2024-04-14: 100 mg via INTRAVENOUS

## 2024-04-14 MED ORDER — ROCURONIUM BROMIDE 10 MG/ML (PF) SYRINGE
PREFILLED_SYRINGE | INTRAVENOUS | Status: AC
  Filled 2024-04-14: qty 10

## 2024-04-14 MED ORDER — CHLORHEXIDINE GLUCONATE 0.12 % MT SOLN
15.0000 mL | Freq: Once | OROMUCOSAL | Status: AC
  Administered 2024-04-14: 15 mL via OROMUCOSAL

## 2024-04-14 MED ORDER — ACETAMINOPHEN 500 MG PO TABS
1000.0000 mg | ORAL_TABLET | ORAL | Status: AC
  Administered 2024-04-14: 1000 mg via ORAL
  Filled 2024-04-14: qty 2

## 2024-04-14 MED ORDER — CHLORHEXIDINE GLUCONATE CLOTH 2 % EX PADS: 6.0000 | MEDICATED_PAD | Freq: Once | CUTANEOUS | Status: DC

## 2024-04-14 MED ORDER — GLYCOPYRROLATE 0.2 MG/ML IJ SOLN
INTRAMUSCULAR | Status: DC | PRN
  Administered 2024-04-14: .2 mg via INTRAVENOUS

## 2024-04-14 MED ORDER — SUCCINYLCHOLINE CHLORIDE 200 MG/10ML IV SOSY
PREFILLED_SYRINGE | INTRAVENOUS | Status: DC | PRN
  Administered 2024-04-14: 200 mg via INTRAVENOUS

## 2024-04-14 MED ORDER — SUGAMMADEX SODIUM 200 MG/2ML IV SOLN
INTRAVENOUS | Status: AC
  Filled 2024-04-14: qty 4

## 2024-04-14 MED ORDER — ROCURONIUM BROMIDE 100 MG/10ML IV SOLN
INTRAVENOUS | Status: DC | PRN
  Administered 2024-04-14 (×2): 10 mg via INTRAVENOUS
  Administered 2024-04-14: 60 mg via INTRAVENOUS

## 2024-04-14 MED ORDER — CELECOXIB 200 MG PO CAPS
200.0000 mg | ORAL_CAPSULE | ORAL | Status: AC
  Administered 2024-04-14: 200 mg via ORAL
  Filled 2024-04-14: qty 1

## 2024-04-14 MED ORDER — ORAL CARE MOUTH RINSE: 15.0000 mL | Freq: Once | OROMUCOSAL | Status: AC

## 2024-04-14 MED ORDER — BUPIVACAINE-EPINEPHRINE (PF) 0.5% -1:200000 IJ SOLN
INTRAMUSCULAR | Status: DC | PRN
  Administered 2024-04-14: 50 mL

## 2024-04-14 MED ORDER — SUGAMMADEX SODIUM 200 MG/2ML IV SOLN
INTRAVENOUS | Status: DC | PRN
  Administered 2024-04-14: 400 mg via INTRAVENOUS

## 2024-04-14 MED ORDER — STERILE WATER FOR IRRIGATION IR SOLN
Status: DC | PRN
  Administered 2024-04-14: 1000 mL

## 2024-04-14 MED ORDER — DIBUCAINE (PERIANAL) 1 % EX OINT
TOPICAL_OINTMENT | CUTANEOUS | Status: AC
  Filled 2024-04-14: qty 28

## 2024-04-14 MED ORDER — DIAZEPAM 5 MG PO TABS: 5.0000 mg | ORAL_TABLET | Freq: Three times a day (TID) | ORAL | 1 refills | Status: DC | PRN

## 2024-04-14 MED ORDER — FENTANYL CITRATE (PF) 100 MCG/2ML IJ SOLN
INTRAMUSCULAR | Status: DC | PRN
  Administered 2024-04-14: 100 ug via INTRAVENOUS
  Administered 2024-04-14 (×2): 50 ug via INTRAVENOUS

## 2024-04-14 MED ORDER — FENTANYL CITRATE (PF) 50 MCG/ML IJ SOSY: 25.0000 ug | PREFILLED_SYRINGE | INTRAMUSCULAR | Status: DC | PRN

## 2024-04-14 MED ORDER — DROPERIDOL 2.5 MG/ML IJ SOLN: 0.6250 mg | Freq: Once | INTRAMUSCULAR | Status: DC | PRN

## 2024-04-14 MED ORDER — OXYCODONE HCL 5 MG/5ML PO SOLN: 5.0000 mg | Freq: Once | ORAL | Status: DC | PRN

## 2024-04-14 MED ORDER — ONDANSETRON HCL 4 MG/2ML IJ SOLN
INTRAMUSCULAR | Status: DC | PRN
  Administered 2024-04-14: 4 mg via INTRAVENOUS

## 2024-04-14 MED ORDER — PROPOFOL 10 MG/ML IV BOLUS
INTRAVENOUS | Status: AC
  Filled 2024-04-14: qty 20

## 2024-04-14 MED ORDER — GABAPENTIN 300 MG PO CAPS
300.0000 mg | ORAL_CAPSULE | ORAL | Status: AC
  Administered 2024-04-14: 300 mg via ORAL
  Filled 2024-04-14: qty 1

## 2024-04-14 MED ORDER — MIDAZOLAM HCL 5 MG/5ML IJ SOLN
INTRAMUSCULAR | Status: DC | PRN
  Administered 2024-04-14: 2 mg via INTRAVENOUS

## 2024-04-14 MED ORDER — LACTATED RINGERS IV SOLN: INTRAVENOUS | Status: DC

## 2024-04-14 MED ORDER — ACETAMINOPHEN 500 MG PO TABS: 1000.0000 mg | ORAL_TABLET | Freq: Once | ORAL | Status: DC

## 2024-04-14 SURGICAL SUPPLY — 33 items
BAG COUNTER SPONGE SURGICOUNT (BAG) IMPLANT
BENZOIN TINCTURE PRP APPL 2/3 (GAUZE/BANDAGES/DRESSINGS) ×4 IMPLANT
BLADE SURG 15 STRL LF DISP TIS (BLADE) IMPLANT
BRIEF MESH DISP LRG (UNDERPADS AND DIAPERS) ×4 IMPLANT
CNTNR URN SCR LID CUP LEK RST (MISCELLANEOUS) ×4 IMPLANT
COVER SURGICAL LIGHT HANDLE (MISCELLANEOUS) ×4 IMPLANT
DRAPE LAPAROTOMY T 102X78X121 (DRAPES) ×4 IMPLANT
ELECT NDL BLADE 2-5/6 (NEEDLE) IMPLANT
ELECT NEEDLE BLADE 2-5/6 (NEEDLE) IMPLANT
ELECT REM PT RETURN 15FT ADLT (MISCELLANEOUS) ×4 IMPLANT
GAUZE 4X4 16PLY ~~LOC~~+RFID DBL (SPONGE) ×4 IMPLANT
GAUZE PAD ABD 8X10 STRL (GAUZE/BANDAGES/DRESSINGS) IMPLANT
GAUZE SPONGE 4X4 12PLY STRL (GAUZE/BANDAGES/DRESSINGS) IMPLANT
GLOVE ECLIPSE 8.0 STRL XLNG CF (GLOVE) ×4 IMPLANT
GLOVE INDICATOR 8.0 STRL GRN (GLOVE) ×4 IMPLANT
GOWN STRL REUS W/ TWL XL LVL3 (GOWN DISPOSABLE) ×12 IMPLANT
KIT BASIN OR (CUSTOM PROCEDURE TRAY) ×4 IMPLANT
KIT TURNOVER KIT A (KITS) ×4 IMPLANT
LOOP VESSEL MAXI BLUE (MISCELLANEOUS) IMPLANT
NDL HYPO 22X1.5 SAFETY MO (MISCELLANEOUS) ×4 IMPLANT
NEEDLE HYPO 22X1.5 SAFETY MO (MISCELLANEOUS) ×3 IMPLANT
PACK BASIC VI WITH GOWN DISP (CUSTOM PROCEDURE TRAY) ×4 IMPLANT
PENCIL BUTTON HOLSTER BLD 10FT (ELECTRODE) ×4 IMPLANT
PROTECTOR NERVE ULNAR (MISCELLANEOUS) IMPLANT
SCRUB CHG 4% DYNA-HEX 4OZ (MISCELLANEOUS) ×4 IMPLANT
SPIKE FLUID TRANSFER (MISCELLANEOUS) ×4 IMPLANT
SURGILUBE 2OZ TUBE FLIPTOP (MISCELLANEOUS) ×4 IMPLANT
SUT CHROMIC 2 0 SH (SUTURE) ×4 IMPLANT
SUT VIC AB 2-0 UR6 27 (SUTURE) ×24 IMPLANT
SYR 20ML LL LF (SYRINGE) ×4 IMPLANT
SYR 3ML LL SCALE MARK (SYRINGE) IMPLANT
TOWEL OR 17X26 10 PK STRL BLUE (TOWEL DISPOSABLE) ×4 IMPLANT
YANKAUER SUCT BULB TIP 10FT TU (MISCELLANEOUS) ×4 IMPLANT

## 2024-04-14 NOTE — Op Note (Signed)
 04/14/2024  12:30 PM  PATIENT:  [name]  33 y.o. 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   (See anesthesia record)  Delay start of Pharmacological VTE agent (>24hrs) due to concerns of significant anemia, surgical blood loss, or risk of bleeding?:  no  DRAINS: (None)  SPECIMEN:  Anal fistula tract  DISPOSITION OF SPECIMEN:  Pathology  COUNTS:  Sponge, needle, & instrument counts CORRECT at the conclusion of the case.      PLAN OF CARE: Discharge to home after PACU  PATIENT DISPOSITION:  PACU - hemodynamically stable.  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  I recommended examination and surgical treatment:  The anatomy & physiology of the anorectal region was discussed.  We discussed the pathophysiology of anorectal abscess and fistula.  Differential diagnosis was discussed.  Natural history progression was discussed.   I  stressed the importance of a bowel regimen to have daily soft bowel movements to minimize progression of disease.     The patient's condition is not adequately controlled.  Non-operative treatment has not healed the fistula.  Therefore, I recommended examination under anaesthesia to confirm the diagnosis and treat the fistula.  I discussed techniques that may be required such as fistulotomy, ligation by LIFT technique, and/or seton placement.  Benefits & alternatives discussed.  I noted a good likelihood this will help address the problem, but sometimes repeat operations and prolonged healing times may occur.  Risks such as bleeding, pain, recurrence, reoperation, incontinence, heart attack, death, and other risks were discussed.      Educational handouts further explaining the pathology, treatment options, and bowel regimen were given.  The patient expressed understanding & wishes to proceed.  We will work to coordinate surgery for a mutually convenient time.   OR FINDINGS:  Patient had an intersphincteric fistula.    External location LEFT POSTERIOR   about 1.5 cm from anal verge.  Internal location : Posterior midline anal canal about 1 cm from anal verge.  Grade 2 internal hemorrhoids right anterior greater than right posterior.  Left lateral grade 1.  Ligation/pexy done only.  DESCRIPTION:   Informed consent was confirmed. Patient underwent general anesthesia without difficulty. Patient was placed into prone/jacknife positioning.  The perianal region was prepped and draped in sterile fashion. Surgical timeout confirmed or plan.  I did digital rectal examination and then transitioned over to anoscopy to get a sense of the anatomy.  Findings as noted above.  I placed a probe through the external opening.   I also injected the track with methylene blue.  With this I was able to locate an  internal opening.  Initially seem to be superficial but with reinspection and Parks retractor placement did  confirm that it was intersphincteric.  No abscess located.  Grade 1 and 2 internal hemorrhoids.  I went ahead and proceeded with the LIFT technique.  I then focused on the internal component using Parks retractor alternating with a large Marketing executive.  Placed a 2-0 Vicryl suture 6 cm proximal anal verge over the internal opening along the left posterior hemorrhoidal column.  Did interrupted running figure-of-eight sutures do hemorrhoidal ligation and pexy.  Came down close to the internal opening.  I made a longitudinal biconcave incision to remove a narrow swath of distal rectal mucosa and anal canal around the internal opening.  I did this to carefully identify the sphincter complex.  I carefully went between the internal and external sphincter using careful blunt dissection parallel to the fibers.  I was able to locate the intersphincteric component of the fistulous tract.  I was able to get around it gently with a right angle clamp.  I carefully skeletonized the intersphincteric component.  I placed 2-0 Vicryl stitches through the intersphincteric tract on the proximal side and on the distal side in between the external & internal sphincters.  I transected the intersphincteric segment of the fistulous tract.   I ligated the stumps of the transected segments with 2-0 Vicryl again with a figure-of-eight stitch in a 90 degree fashion to doubly ligate and prove that the tract had been closed.  I then ran the 2-0 Vicryl stitch down to the anal verge to also help cover up the intersphincteric ligation wound as well.  I tied that running suture down over the anal retractor, thus closing the internal opening and protecting the LIFT repair without causing any luminal narrowing.  I focused on the hemorrhoidal piles. I used a 2-0 Vicryl suture on a UR-6 needle in a figure-of-eight fashion 6 cm proximal to the anal verge.  I started at the largest hemorrhoid pile, right anterior.  I then ran the running  interrupted 2-0 Vicryl suture longitudinally along that hemorrhoidal column more distally to close the hemorrhoidectomy wound to the anal verge over a large Parks self-retaining anal retractor to avoid narrowing of the anal canal.  I then tied that stitch down to cause a hemorrhoidopexy.   I then completed hemorrhoidal ligation and pexy at the other 4 hemorrhoidal columns.  At the completion of this, all 6 anorectal columns were ligated and pexied in the classic hexagonal fashion (right anterior/lateral/posterior, left anterior/lateral/posterior).    I removed the superficial external end of the fistulous tract & ligated the base of the external wound just outside the sphincter component with 2-0 vicryl.I excised some excess skin to have a 2 x 2 cm broad open flat wound.  I did place some figure-of-eight 2-0 chromic sutures at the base closer to the sphincter complex to help make it more superficial.     I reexamined the anal canal.   There is was no narrowing.  Hemostasis was excellent.  I repeated anoscopy and examination.  Hemostasis was good.  We placed fluff gauze to onlay over the wounds.  No packing done.    I discussed operative plans and postop recovery her condition of the patient's in the holding area.  Instructions written and prescription sent.  I called to reach his mother whom he wished me to contact,  Avelina Louder.  I got voicemail.  I left a message.  We will try and  update later.  Elspeth KYM Schultze, M.D., F.A.C.S. Gastrointestinal and Minimally Invasive Surgery [facility] [address] [place], [place] [address] [phone] Main / Paging

## 2024-04-14 NOTE — Transfer of Care (Signed)
 Immediate Anesthesia Transfer of Care Note  Patient: [name]  Procedure(s) Performed: ANAL FISTULOTOMY HEMORRHOIDECTOMY EXAM UNDER ANESTHESIA, RECTUM  Patient Location: PACU  Anesthesia Type:General  Level of Consciousness: awake, alert , and oriented  Airway & Oxygen Therapy: Patient Spontanous Breathing and Patient connected to face mask oxygen  Post-op Assessment: Report given to RN and Post -op Vital signs reviewed and stable  Post vital signs: Reviewed and stable  Last Vitals:  Vitals Value Taken Time  BP 151/81 04/14/24 12:45  Temp 36.9 C 04/14/24 12:45  Pulse 105 04/14/24 12:46  Resp 20 04/14/24 12:46  SpO2 100 % 04/14/24 12:46  Vitals shown include unfiled device data.  Last Pain:  Vitals:   04/14/24 0915  TempSrc: Oral  PainSc: 0-No pain         Complications: No notable events documented.

## 2024-04-14 NOTE — Interval H&P Note (Signed)
 History and Physical Interval Note:  04/14/2024 9:21 AM  [name]  has presented today for surgery, with the diagnosis of PERIRECTAL FISTULA.  The various methods of treatment have been discussed with the patient and family. After consideration of risks, benefits and other options for treatment, the patient has consented to  Procedure(s) with comments: ANAL FISTULOTOMY (N/A) - REPAIR OF PERIRECTAL FISTULA HEMORRHOIDECTOMY (N/A) EXAM UNDER ANESTHESIA, RECTUM (N/A) as a surgical intervention.  The patient's history has been reviewed, patient examined, no change in status, stable for surgery.  I have reviewed the patient's chart and labs.  Questions were answered to the patient's satisfaction.    I have re-reviewed the the patient's records, history, medications, and allergies.  I have re-examined the patient.  I again discussed intraoperative plans and goals of post-operative recovery.  The patient agrees to proceed.  [name]  23-Dec-1990 [id number]  Patient Care Team: Sowles, Krichna, MD as PCP - General (Family Medicine) Rennie Cindy SAUNDERS, MD as Consulting Physician (Oncology) Sheldon Standing, MD as Consulting Physician (Colon and Rectal Surgery) Maryruth Ole DASEN, MD as Consulting Physician (Gastroenterology) Rodolph Romano, MD as Consulting Physician (General Surgery)  Patient Active Problem List   Diagnosis Date Noted   Microcytic anemia 12/09/2022   Umbilical hernia without obstruction and without gangrene 11/29/2021   Splenomegaly 11/29/2021   Fatty liver 11/29/2021   Pre-diabetes 04/12/2021   Primary hypertension 04/12/2021   COVID-19 virus infection 02/07/2019   Asthma, mild intermittent    Morbid obesity (HCC) 02/08/2008    Past Medical History:  Diagnosis Date   Anemia    Asthma, mild intermittent    Chronic constipation    Hypertension    Morbid obesity (HCC)    Pre-diabetes    Sickle cell trait     Past Surgical History:  Procedure  Laterality Date   COLONOSCOPY WITH PROPOFOL  N/A 05/23/2022   Procedure: COLONOSCOPY WITH PROPOFOL ;  Surgeon: Maryruth Ole DASEN, MD;  Location: [facility] ENDOSCOPY;  Service: Endoscopy;  Laterality: N/A;   ESOPHAGOGASTRODUODENOSCOPY (EGD) WITH PROPOFOL  N/A 05/23/2022   Procedure: ESOPHAGOGASTRODUODENOSCOPY (EGD) WITH PROPOFOL ;  Surgeon: Maryruth Ole DASEN, MD;  Location: [facility] ENDOSCOPY;  Service: Endoscopy;  Laterality: N/A;    Social History   Socioeconomic History   Marital status: Single    Spouse name: Not on file   Number of children: 0   Years of education: Not on file   Highest education level: Some college, no degree  Occupational History   Occupation: management consultant   Tobacco Use   Smoking status: Never   Smokeless tobacco: Never  Vaping Use   Vaping status: Never Used  Substance and Sexual Activity   Alcohol use: Not Currently    Comment: rare   Drug use: Not Currently   Sexual activity: Yes    Partners: Female    Birth control/protection: Condom    Comment: not always   Other Topics Concern   Not on file  Social History Narrative   Not on file   Social Drivers of Health   Financial Resource Strain: Low Risk  (03/11/2024)   Overall Financial Resource Strain (CARDIA)    Difficulty of Paying Living Expenses: Not hard at all  Food Insecurity: No Food Insecurity (03/11/2024)   Hunger Vital Sign    Worried About Running Out of Food in the Last Year: Never true    Ran Out of Food in the Last Year: Never true  Transportation Needs: No Transportation Needs (03/11/2024)   PRAPARE -  Administrator, Civil Service (Medical): No    Lack of Transportation (Non-Medical): No  Physical Activity: Sufficiently Active (03/11/2024)   Exercise Vital Sign    Days of Exercise per Week: 5 days    Minutes of Exercise per Session: 30 min  Stress: No Stress Concern Present (03/11/2024)   [employer] of Occupational Health - Occupational Stress Questionnaire     Feeling of Stress: Not at all  Social Connections: Moderately Integrated (03/11/2024)   Social Connection and Isolation Panel    Frequency of Communication with Friends and Family: More than three times a week    Frequency of Social Gatherings with Friends and Family: More than three times a week    Attends Religious Services: 1 to 4 times per year    Active Member of [employer] or Organizations: Yes    Attends Banker Meetings: 1 to 4 times per year    Marital Status: Never married  Intimate Partner Violence: Not At Risk (03/11/2024)   Humiliation, Afraid, Rape, and Kick questionnaire    Fear of Current or Ex-Partner: No    Emotionally Abused: No    Physically Abused: No    Sexually Abused: No    Family History  Problem Relation Age of Onset   Obesity Mother    Kidney disease Father        mass   Liver cancer Father    Obesity Sister     Medications Prior to Admission  Medication Sig Dispense Refill Last Dose/Taking   albuterol  (VENTOLIN  HFA) 108 (90 Base) MCG/ACT inhaler Inhale 2 puffs into the lungs every 4 (four) hours as needed for wheezing or shortness of breath. 18 each 0 Taking As Needed   fluticasone  (FLONASE ) 50 MCG/ACT nasal spray Place 2 sprays into both nostrils daily. (Patient taking differently: Place 2 sprays into both nostrils daily as needed for allergies.) 16 g 2 Taking Differently   polyethylene glycol powder (GLYCOLAX /MIRALAX ) 17 GM/SCOOP powder Take 17 g by mouth daily. (Patient taking differently: Take 17 g by mouth daily as needed (constipation.).) 3350 g 0 Taking Differently   valsartan -hydrochlorothiazide  (DIOVAN -HCT) 80-12.5 MG tablet Take 1 tablet by mouth daily. 90 tablet 1 Taking    Current Facility-Administered Medications  Medication Dose Route Frequency Provider Last Rate Last Admin   acetaminophen (TYLENOL) tablet 1,000 mg  1,000 mg Oral Once Boone Fess, MD         Allergies  Allergen Reactions   Metformin  And Related      Nausea    Ozempic (0.25 Or 0.5 Mg-Dose) [Semaglutide (0.25 Or 0.5mg -Dos)] Nausea Only    He actually tried Rybelsus  but not on database     BP (!) 189/105   Pulse (!) 113   Temp 97.7 F (36.5 C) (Oral)   Resp 18   SpO2 100%   Labs: No results found for this or any previous visit (from the past 48 hours).  Imaging / Studies: No results found.   Briant KYM Schultze, M.D., F.A.C.S. Gastrointestinal and Minimally Invasive Surgery [facility] [address] [place], [place] [address] [phone] Main / Paging  04/14/2024 9:21 AM    Elspeth JAYSON Schultze

## 2024-04-14 NOTE — Discharge Instructions (Signed)
 ##############################################  ANORECTAL SURGERY:  POST OPERATIVE INSTRUCTIONS  ######################################################################  EAT Start with a pureed / full liquid diet After 24 hours, gradually transition to a high fiber diet.    CONTROL PAIN Control pain so you can tolerate bowel movements,  walk, sleep, tolerate sneezing/coughing, and go up/down stairs.   HAVE A BOWEL MOVEMENT DAILY Keep your bowels regular to avoid problems.   Taking a fiber supplement 2-4 doses every day to keep bowels soft.   HAVE A BM BY THE SECOND POSTOPERATIVE DAY Use an antidairrheal to slow down diarrhea.   Call if not better after 2 tries  WALK Walk an hour a day.  Control your pain to do that.   CALL IF YOU HAVE PROBLEMS/CONCERNS Call if you are still struggling despite following these instructions. Call if you have concerns not answered by these instructions  ######################################################################    Take your usually prescribed home medications unless otherwise directed. Blood thinners:  You can restart any strong blood thinners after the second postoperative day  for example: COUMADIN (warfarin), XERELTO (rivaroxaban), ELIQUIS (apixaban), PLAVIX (clopidigrel), BRILINTA (ticagrelor), EFFIENT (prasugrel), PRADAXA (dabigatran), etc  Continue aspirin before & after surgery..     Some oozing/bleeding the first 1-2 weeks is common but should taper down & be small volume.    If you are passing many large clots or having uncontrolling bleeding, call your surgeon  DIET: Follow a light bland diet & liquids the first 24 hours after arrival home, such as soup, liquids, starches, etc.  Be sure to drink plenty of fluids.  Quickly advance to a usual solid diet within a few days.  Avoid fast food or heavy meals as your are more likely to get nauseated or have irregular bowels.  A low-fat, high-fiber diet for the rest of your life  is ideal.  Control pain to avoid problems with urinating or defecating:  Pain is best controlled by a usual combination of many methods TOGETHER: Warm baths/soaks or Ice packs Over the counter pain medication Prescription pain medications Topical creams  A.  Warm water baths or ice packs (30-60 minutes up to 8 times a day, especially after bowel meovements) will help.  Using ice for the first few days can help decrease swelling and bruising,  Use heat such as warm towels, sitz baths, warm baths, warm showers, etc to help relax tight/sore spots and speed recovery.   Some people prefer to use ice alone, heat alone, alternating between ice & heat.  Experiment to what works for you.    It is helpful to take an over-the-counter pain medication continuously for the first few weeks.  This helps people need to use less narcotics Choose one of the following that works best for you: Naproxen (Aleve, etc)  Two 220mg  tabs twice a day Ibuprofen (Advil, etc) Three 200mg  tabs four times a day (every meal & bedtime) Acetaminophen (Tylenol, etc) 500-650mg  four times a day (every meal & bedtime)  A  prescription for pain medication (such as oxycodone, hydrocodone, etc) should be given to you upon discharge.  Take your pain medication as prescribed.  If you are having problems/concerns with the prescription medicine (does not control pain, nausea, vomiting, rash, itching, etc), please call us [phone] to see if we need to switch you to a different pain medicine that will work better for you and/or control your side effect better. Most people will need 1-2 refills to get through the first couple of weeks when pain is the most  intense.  If you need a refill on your pain medication, please contact your pharmacy.  They will contact our office to request authorization. Prescriptions will not be filled after 5 pm or on week-ends.  If can take up to 48 hours for it to be filled & ready so avoid waiting until you  are down to thel ast pill.  A numbing topical cream (Dibucaine) may be given to you.  Many people find relief with topical creams.  Some people find it burns too much.  Experiment.  If it helps, use it.  If it burns, stop using it.  You also may receive a prescription for diazepam, a muscle relaxant to help you to be able to urinate at first easily.  It is safe to take a few doses with the other medications as long as you are not planning to drive or do anything intense.  Hopefully this can minimize the chance of needing a Foley catheter into your bladder     Have a bowel movement every day  Until you have a bowel movement after surgery, he will struggle with urinating and controlling her pain.  Take a fiber supplement (such as Metamucil, Citrucel, FiberCon, MiraLax, etc) 2-4 doses a day to help prevent constipation from occurring.     If you have not had a bowel movement the second postoperative day:  -switch to drinking liquids only -take MiraLAX double dose every 2 hours x 3 or until you have a BM -If that does not work x 3 doses, increase to 4 doses every 2 hours (Like a small bowel prep) until you have a bowel movement. -Avoid enemas or suppositories (can harm sutures/repair) -Once you start having bowel movements, adjust your fiber supplement or Miralax dosing back down until you are having 1-2 soft well formed BMs a day.  (Start at 2 doses a day & adjust up or down to avoid diarrhea or recurrent constipation)   Watch out for diarrhea.  If you have many loose bowel movements, simplify your diet to bland foods & liquids for a few days.  Stop any stool softeners and decrease your fiber supplement.  Switching to mild anti-diarrheal medications (Kayopectate, Pepto Bismol) can help.  Can try an imodium/loperamide dose.  If this worsens or does not improve, please call us.  Wound Care   a. You have some fluffed cotton on top of the anus to help catch drainage and bleeding.  THERE IS NO  PACKING INSIDE THE RECTUM -Let the cotton fall off with the first bowel movement or shower.  It is okay to reinforce or replace as needed.  Bleeding is common at first and gradually slows down after a few weeks   b. Place soft cotton balls on the anus/wounds and use an absorbent pad in your underwear as needed to catch any drainage and help keep the area.  Try to use cotton balls or pads (regular gauze or toilet paper will stick and pull, causing pain.  Cotton will come off more easily).  OK to try dry powders such as cornstarch or baby powders   c. Keep the area clean and dry.  Bathe / shower every day.  Keep the area clean by showering / bathing over the incision / wound.   It is okay to soak an open wound to help wash it.  Consider using a squeeze bottle filled with warm water to gently wash the anal area.  Wet wipes or showers / gentle washing after bowel movements  is often less traumatic than regular toilet paper.           D.  Use a Sitz Bath 4-8 times a day for relief  A sitz bath is a warm water bath taken in the sitting position that covers only the hips and buttocks. It may be used for either healing or hygiene purposes. Sitz baths are also used to relieve pain, itching, or muscle spasms.  Gently cleaned the area and the heat will help lower spasm and offer better pain control.    Fill the bathtub half full with warm water. Sit in the water and open the drain a little. Turn on the warm water to keep the tub half full. Keep the water running constantly. Soak in the water for 15 to 20 minutes. After the sitz bath, pat the affected area dry first.   d. You will often notice bleeding, especially with bowel movements.  This should slow down by the end of the first week of surgery.  It can take over a month to more fully stop.  Sitting on an ice pack can help.   e. Expect some drainage.  You often will have some blood or yellow drainage with open wounds.  Sometimes she will get a little leaking  of liquid stool until the incision/wounds have fully close down.  This should slow down by the end of the first week of surgery, but you will have occasional bleeding or drainage up to a few months after surgery until all incisions & wounds have closed.  Wear an absorbent pad or soft cotton gauze in your underwear until the drainage stops.  ACTIVITIES as tolerated:    You may resume regular (light) daily activities beginning the next day--such as daily self-care, walking, climbing stairs--gradually increasing activities as tolerated.  If you can walk 30 minutes without difficulty, it is safe to try more intense activity such as jogging, treadmill, bicycling, low-impact aerobics, swimming, etc. Save the most intensive and strenuous activity for last such as sit-ups, heavy lifting, contact sports, etc  Refrain from any heavy lifting or straining until you are off narcotics for pain control.   DO NOT PUSH THROUGH PAIN.  Let pain be your guide: If it hurts to do something, don't do it.  Pain is your body warning you to avoid that activity for another week until the pain goes down. You may drive when you are no longer taking prescription pain medication, you can comfortably sit for long periods of time, and you can safely maneuver your car and apply brakes. You may have sexual intercourse when it is comfortable.   6.  FOLLOW UP in our office Please call [facility] at [phone] to set up an appointment to see your surgeon in the office for a follow-up appointment approximately 3 weeks after your surgery. If tissue is sent for pathology, it usually takes a week for pathology results to come back.  We will make an effort to call you with results as soon as we get them.  If you have not heard back in a week and wish to know the results before your postop visit, please call our office and we will see if we can give feedback on the results Make sure that you call for this appointment the day you arrive home to  ensure a convenient appointment time.  7. IF YOU HAVE DISABILITY OR FAMILY LEAVE FORMS, BRING THEM TO THE OFFICE FOR PROCESSING.  DO NOT GIVE THEM TO YOUR  DOCTOR.        WHEN TO CALL us [phone]: Poor pain control Reactions / problems with new medications (rash/itching, nausea, etc)  Fever over 101.5 F (38.5 C) Inability to urinate Nausea and/or vomiting Worsening swelling or bruising Continued bleeding from incision. Increased pain, redness, or drainage from the incision  The clinic staff is available to answer your questions during regular business hours (8:30am-5pm).  Please don't hesitate to call and ask to speak to one of our nurses for clinical concerns.   A surgeon from [facility] is always on call at the hospitals   If you have a medical emergency, go to the nearest emergency room or call 911.    [facility] [address], [place], [place]  [address] ? MAIN: [phone] ? TOLL FREE: [phone] ? FAX [phone] [email or web address]  #####################################################

## 2024-04-14 NOTE — Anesthesia Procedure Notes (Signed)
 Procedure Name: Intubation Date/Time: 04/14/2024 11:29 AM  Performed by: Buster Catheryn SAUNDERS, CRNAPre-anesthesia Checklist: Patient identified, Emergency Drugs available, Suction available, Patient being monitored and Timeout performed Patient Re-evaluated:Patient Re-evaluated prior to induction Oxygen Delivery Method: Circle system utilized Preoxygenation: Pre-oxygenation with 100% oxygen Induction Type: IV induction Ventilation: Mask ventilation without difficulty Laryngoscope Size: Glidescope and 4 Tube type: Oral Tube size: 8.0 mm Number of attempts: 1 Airway Equipment and Method: Stylet and Oral airway Placement Confirmation: ETT inserted through vocal cords under direct vision, positive ETCO2 and breath sounds checked- equal and bilateral Secured at: 26 cm Tube secured with: Tape Dental Injury: Teeth and Oropharynx as per pre-operative assessment  Difficulty Due To: Difficulty was anticipated and Difficult Airway- due to large tongue Future Recommendations: Recommend- induction with short-acting agent, and alternative techniques readily available

## 2024-04-14 NOTE — Anesthesia Preprocedure Evaluation (Signed)
 Anesthesia Evaluation  Patient identified by MRN, date of birth, ID band Patient awake    Reviewed: Allergy & Precautions, NPO status , Patient's Chart, lab work & pertinent test results  History of Anesthesia Complications Negative for: history of anesthetic complications  Airway Mallampati: III  TM Distance: >3 FB Neck ROM: Full    Dental no notable dental hx. (+) Teeth Intact   Pulmonary asthma , neg sleep apnea, neg COPD, Patient abstained from smoking.Not current smoker   Pulmonary exam normal breath sounds clear to auscultation       Cardiovascular Exercise Tolerance: Good METShypertension, (-) CAD and (-) Past MI (-) dysrhythmias  Rhythm:Regular Rate:Normal - Systolic murmurs    Neuro/Psych negative neurological ROS  negative psych ROS   GI/Hepatic ,neg GERD  ,,(+)     (-) substance abuse    Endo/Other  neg diabetes  Class 4 obesity  Renal/GU negative Renal ROS     Musculoskeletal   Abdominal  (+) + obese  Peds  Hematology   Anesthesia Other Findings Past Medical History: No date: Anemia No date: Asthma, mild intermittent No date: Chronic constipation No date: Hypertension No date: Morbid obesity (HCC) No date: Pre-diabetes No date: Sickle cell trait  Reproductive/Obstetrics                              Anesthesia Physical Anesthesia Plan  ASA: 3  Anesthesia Plan: General   Post-op Pain Management: Tylenol PO (pre-op)*, Gabapentin PO (pre-op)* and Celebrex PO (pre-op)*   Induction: Intravenous  PONV Risk Score and Plan: 2 and Ondansetron , Dexamethasone  and Midazolam  Airway Management Planned: Oral ETT and Video Laryngoscope Planned  Additional Equipment: None  Intra-op Plan:   Post-operative Plan: Extubation in OR  Informed Consent: I have reviewed the patients History and Physical, chart, labs and discussed the procedure including the risks, benefits and  alternatives for the proposed anesthesia with the patient or authorized representative who has indicated his/her understanding and acceptance.     Dental advisory given  Plan Discussed with: CRNA and Surgeon  Anesthesia Plan Comments: (Discussed risks of anesthesia with patient, including PONV, sore throat, lip/dental/eye damage. Rare risks discussed as well, such as cardiorespiratory and neurological sequelae, and allergic reactions. Discussed the role of CRNA in patient's perioperative care. Patient understands. Patient informed about increased incidence of above perioperative risk due to high BMI. Patient understands.  )        Anesthesia Quick Evaluation

## 2024-04-14 NOTE — Anesthesia Postprocedure Evaluation (Signed)
 Anesthesia Post Note  Patient: [name]  Procedure(s) Performed: ANAL FISTULOTOMY HEMORRHOIDECTOMY EXAM UNDER ANESTHESIA, RECTUM     Patient location during evaluation: PACU Anesthesia Type: General Level of consciousness: awake and alert Pain management: pain level controlled Vital Signs Assessment: post-procedure vital signs reviewed and stable Respiratory status: spontaneous breathing, nonlabored ventilation, respiratory function stable and patient connected to nasal cannula oxygen Cardiovascular status: blood pressure returned to baseline and stable Postop Assessment: no apparent nausea or vomiting Anesthetic complications: no   No notable events documented.  Last Vitals:  Vitals:   04/14/24 1315 04/14/24 1330  BP: (!) 159/83 (!) 162/84  Pulse: 67 86  Resp: 10 11  Temp: 36.7 C   SpO2: 99% 96%    Last Pain:  Vitals:   04/14/24 1330  TempSrc:   PainSc: 0-No pain                 Rome Ade

## 2024-04-15 ENCOUNTER — Encounter (HOSPITAL_COMMUNITY): Payer: Self-pay | Admitting: Surgery

## 2024-04-15 LAB — SURGICAL PATHOLOGY

## 2024-06-03 ENCOUNTER — Encounter: Payer: Self-pay | Admitting: Family Medicine

## 2024-06-03 ENCOUNTER — Ambulatory Visit: Admitting: Family Medicine

## 2024-06-03 DIAGNOSIS — I10 Essential (primary) hypertension: Secondary | ICD-10-CM | POA: Diagnosis not present

## 2024-06-03 DIAGNOSIS — R161 Splenomegaly, not elsewhere classified: Secondary | ICD-10-CM

## 2024-06-03 DIAGNOSIS — J4521 Mild intermittent asthma with (acute) exacerbation: Secondary | ICD-10-CM

## 2024-06-03 DIAGNOSIS — R7303 Prediabetes: Secondary | ICD-10-CM

## 2024-06-03 DIAGNOSIS — M545 Low back pain, unspecified: Secondary | ICD-10-CM

## 2024-06-03 DIAGNOSIS — D573 Sickle-cell trait: Secondary | ICD-10-CM | POA: Diagnosis not present

## 2024-06-03 DIAGNOSIS — K625 Hemorrhage of anus and rectum: Secondary | ICD-10-CM | POA: Diagnosis not present

## 2024-06-03 MED ORDER — VALSARTAN-HYDROCHLOROTHIAZIDE 80-12.5 MG PO TABS: 1.0000 | ORAL_TABLET | Freq: Every day | ORAL | 1 refills | Status: AC

## 2024-06-03 NOTE — Progress Notes (Signed)
 r54Name: [name]   MRN: [id number]    DOB: 06-28-1990   Date:06/03/2024       Progress Note  Subjective  Chief Complaint  Chief Complaint  Patient presents with   Medical Management of Chronic Issues    Needs work accommodation has paperwork in room to sit and stand desk, ergonomically mouse and keypad   Discussed the use of AI scribe software for clinical note transcription with the patient, who gave verbal consent to proceed.  History of Present Illness [name] is a 34 year old male who presents for work accommodations due to intermittent back pain and post-surgical complications.  He requests work accommodations, specifically a statistician and mouse, due to intermittent back pain and post-surgical complications. His current desk is too low, causing back pain by the end of the day. He also experiences discomfort and bleeding after bowel movements, which he attributes to pressure from sitting, exacerbated by his recent surgery on December 16th for a anal fistula repair.  He has a history of anal  fistula surgery, with the initial procedure on November 20th and a follow-up surgery on December 16th. Post-surgery, he continues to experience pain and bleeding. The pain is still present but not as severe as before.  He experiences back pain, particularly noticeable at the end of the day and while driving home, described as stiffness and tightness. He attributes this to prolonged sitting and his tall stature. He has not engaged in physical therapy but uses a big and tall chair at work.  He has a history of hypertension and takes HCTZ daily. No chest pain, palpitations, or headaches related to his blood pressure.  He has mild intermittent asthma but reports no recent cough, wheezing, or shortness of breath. He carries an inhaler for precautionary measures.  He is managing prediabetes with a high-fiber diet, which has helped him lose weight, particularly through  the holiday season. His BMI is noted to be over 40, with a recent weight loss of six pounds since July.  He has a history of hepatic steatosis and splenomegaly and was told by sub-specialist that losing weight will help it. He also carries the sickle cell trait.    Patient Active Problem List   Diagnosis Date Noted   Microcytic anemia 12/09/2022   Umbilical hernia without obstruction and without gangrene 11/29/2021   Splenomegaly 11/29/2021   Fatty liver 11/29/2021   Pre-diabetes 04/12/2021   Primary hypertension 04/12/2021   COVID-19 virus infection 02/07/2019   Asthma, mild intermittent    Morbid obesity (HCC) 02/08/2008    Past Surgical History:  Procedure Laterality Date   ANAL FISTULOTOMY N/A 04/14/2024   Procedure: ANAL FISTULA REPAIR - LIFT (Ligation of Intersphincteric Fistula Tract);  Surgeon: Sheldon Standing, MD;  Location: [facility];  Service: General;  Laterality: N/A;  ANAL FISTULA REPAIR - LIFT (Ligation of Intersphincteric Fistula Tract)   COLONOSCOPY WITH PROPOFOL  N/A 05/23/2022   Procedure: COLONOSCOPY WITH PROPOFOL ;  Surgeon: Maryruth Ole DASEN, MD;  Location: [facility];  Service: Endoscopy;  Laterality: N/A;   ESOPHAGOGASTRODUODENOSCOPY (EGD) WITH PROPOFOL  N/A 05/23/2022   Procedure: ESOPHAGOGASTRODUODENOSCOPY (EGD) WITH PROPOFOL ;  Surgeon: Maryruth Ole DASEN, MD;  Location: [facility];  Service: Endoscopy;  Laterality: N/A;   HEMORRHOID BANDING N/A 04/14/2024   Procedure: HEMORRHOIDAL LIGATION & HEMORRHOIDOPEXY;  Surgeon: Sheldon Standing, MD;  Location: [facility];  Service: General;  Laterality: N/A;   RECTAL EXAM UNDER ANESTHESIA N/A 04/14/2024   Procedure: EXAM UNDER ANESTHESIA, RECTUM;  Surgeon: Sheldon Standing, MD;  Location: [facility];  Service: General;  Laterality: N/A;    Family History  Problem Relation Age of Onset   Obesity Mother    Kidney disease Father        mass   Liver cancer Father    Obesity Sister     Social History   Tobacco Use    Smoking status: Never   Smokeless tobacco: Never  Substance Use Topics   Alcohol use: Not Currently    Comment: rare    Current Medications[1]  Allergies[2]  I personally reviewed active problem list, medication list, allergies, family history with the patient/caregiver today.   ROS  Ten systems reviewed and is negative except as mentioned in HPI    Objective Physical Exam  CONSTITUTIONAL: Patient appears well-developed and well-nourished. No distress. HEENT: Head atraumatic, normocephalic, neck supple. CARDIOVASCULAR: Normal rate, regular rhythm and normal heart sounds. No murmur heard. No BLE edema. PULMONARY: Effort normal and breath sounds normal. Lungs clear to auscultation. No respiratory distress. ABDOMINAL: There is no tenderness or distention. MUSCULOSKELETAL: Normal gait. Spine normal range of motion. Without gross motor or sensory deficit. PSYCHIATRIC: Patient has a normal mood and affect. Behavior is normal. Judgment and thought content normal.  Vitals:   06/03/24 1330  BP: 132/74  Pulse: 99  Resp: 16  Temp: (!) 97.3 F (36.3 C)  SpO2: 94%  Weight: (!) 485 lb 14.4 oz (220.4 kg)  Height: 6' 4 (1.93 m)    Body mass index is 59.15 kg/m.  Recent Results (from the past 2160 hours)  Urine cytology ancillary only     Status: None   Collection Time: 03/11/24  1:39 PM  Result Value Ref Range   Neisseria Gonorrhea Negative    Chlamydia Negative    Comment Normal Reference Ranger Chlamydia - Negative    Comment      Normal Reference Range Neisseria Gonorrhea - Negative  Basic metabolic panel per protocol     Status: None   Collection Time: 04/08/24 11:27 AM  Result Value Ref Range   Sodium 140 135 - 145 mmol/L   Potassium 4.0 3.5 - 5.1 mmol/L   Chloride 103 98 - 111 mmol/L   CO2 26 22 - 32 mmol/L   Glucose, Bld 88 70 - 99 mg/dL    Comment: Glucose reference range applies only to samples taken after fasting for at least 8 hours.   BUN 18 6 - 20 mg/dL    Creatinine, Ser 9.17 0.61 - 1.24 mg/dL   Calcium 9.4 8.9 - 89.6 mg/dL   GFR, Estimated >39 >39 mL/min    Comment: (NOTE) Calculated using the CKD-EPI Creatinine Equation (2021)    Anion gap 10 5 - 15    Comment: Performed at [facility], [address], [place], [place] [address]  CBC per protocol     Status: Abnormal   Collection Time: 04/08/24 11:27 AM  Result Value Ref Range   WBC 8.4 4.0 - 10.5 K/uL   RBC 5.00 4.22 - 5.81 MIL/uL   Hemoglobin 11.7 (L) 13.0 - 17.0 g/dL   HCT 63.0 (L) 60.9 - 47.9 %   MCV 73.8 (L) 80.0 - 100.0 fL   MCH 23.4 (L) 26.0 - 34.0 pg   MCHC 31.7 30.0 - 36.0 g/dL   RDW 85.3 88.4 - 84.4 %   Platelets 250 150 - 400 K/uL   nRBC 0.0 0.0 - 0.2 %    Comment: Performed at [facility], [address] [place], [place] [address]  Surgical pathology     Status: None   Collection Time: 04/14/24 12:00 PM  Result Value Ref Range   SURGICAL PATHOLOGY      SURGICAL PATHOLOGY CASE: [id number] PATIENT: REDELL SIERRA Surgical Pathology Report     Clinical History: perirectal fistula (tb)     FINAL MICROSCOPIC DIAGNOSIS:  A. INTERNAL FISTULA TRACT: - Benign skin with acute and chronic inflammation consistent with fistula tract  B. LEFT POSTERIOR EXTERNAL FISTULA TRACT: - Benign skin with acute and chronic inflammation consistent with fistula tract    GROSS DESCRIPTION:  A.  Received fresh is a 0.8 x 0.7 x 0.3 cm fragment of red-tan soft tissue.  The specimen is entirely submitted in 1 block.  B.  Received fresh are 2 fragments of pink-tan to hemorrhagic soft tissue measuring 1.5 and 1.6 cm in greatest dimension.  Sectioning reveals underlying pink-tan to hemorrhagic cut surfaces.  Representative sections are submitted in 1 block. MARYSUE, 04/14/2024)    Final Diagnosis performed by Ilsa Pottier, MD.   Electronically signed 04/15/2024 Technical and / or Professional components performed at [facility], [address] [place], [place] [address].  Immunohistochemistry Technical component (if applicable) was performed at [facility]. [address], [address], [place], [place] [address].   IMMUNOHISTOCHEMISTRY DISCLAIMER (if applicable): Some of these immunohistochemical stains may have been developed and the performance characteristics determine by [facility]. Some may not have been cleared or approved by the U.S. Food and Drug Administration. The FDA has determined that such clearance or approval is not necessary. This test is used for clinical purposes. It should not be regarded as investigational or for research. This laboratory is certified under the Clinical Laboratory Improvement Amendments of 1988 (CLIA-88) as qualified to perform high complexity clinical laboratory testing.  The controls stained appropriately.   IHC stains are performed on formalin fixed, paraffin embedded tissue using a 3,3diaminobenzidine (DAB ) chromogen and Leica Bond Autostainer System. The staining intensity of the nucleus is score manually and is reported as the percentage of tumor cell nuclei demonstrating specific nuclear staining. The specimens are fixed in 10% Neutral Formalin for at least 6 hours and up to 72hrs. These tests are validated on decalcified tissue. Results should be interpreted with caution given the possibility of false negative results on decalcified specimens. Antibody Clones are as follows ER-clone 24F, PR-clone 16, Ki67- clone MM1. Some of these immunohistochemical stains may have been developed and the performance characteristics determined by [facility].       PHQ2/9:    06/03/2024    1:30 PM 03/15/2024    3:09 PM 03/11/2024    1:07 PM 12/01/2023    2:23 PM 11/06/2023   10:18 AM  Depression screen PHQ 2/9  Decreased Interest 0 0 0 0 0  Down, Depressed, Hopeless 0 0 0 0 0  PHQ - 2 Score 0 0 0 0 0  Altered sleeping     0 0  Tired, decreased energy    0 0  Change in appetite    0 0  Feeling bad or failure about yourself     0 0  Trouble concentrating    0 0  Moving slowly or fidgety/restless    0 0  Suicidal thoughts    0 0  PHQ-9 Score    0  0   Difficult doing work/chores    Not difficult at all Not  difficult at all     Data saved with a previous flowsheet row definition    phq 9 is negative  Fall Risk:    06/03/2024    1:30 PM 03/15/2024    3:09 PM 03/11/2024    1:07 PM 12/01/2023    2:23 PM 11/06/2023   10:17 AM  Fall Risk   Falls in the past year? 0 0 0 0 0  Number falls in past yr: 0 0 0 0 0  Injury with Fall? 0 0  0  0  0   Risk for fall due to : No Fall Risks No Fall Risks No Fall Risks No Fall Risks No Fall Risks  Follow up Falls evaluation completed Falls evaluation completed Falls evaluation completed Falls evaluation completed Falls evaluation completed     Data saved with a previous flowsheet row definition      Assessment & Plan Rectal bleeding and pain after anal fistula surgery Persistent symptoms post-surgery with expected prolonged healing.  Primary hypertension Blood pressure slightly elevated. Home monitoring inconsistent. - Monitor blood pressure at home a couple of times a week. - Consider increasing HCTZ dose if home readings remain elevated.  Morbid obesity BMI over 40 with recent weight loss due to dietary changes. - Continue high-fiber diet to aid in weight management.  Pre-diabetes Following diabetic diet to manage blood sugar. - Continue diabetic diet with high fiber intake.  Intermittent low back pain Pain likely from prolonged sitting and tall stature. - Requested ergonomic chair and sit-stand desk for work accommodations. - Encouraged stretching exercises and physical activity.  Mild intermittent asthma Inhaler carried for emergencies. - Continue carrying inhaler for emergency use.  Splenomegaly and hepatic steatosis Weight loss advised to  manage conditions. - Continue weight loss efforts to manage hepatic steatosis and splenomegaly.        [1]  Current Outpatient Medications:    albuterol  (VENTOLIN  HFA) 108 (90 Base) MCG/ACT inhaler, Inhale 2 puffs into the lungs every 4 (four) hours as needed for wheezing or shortness of breath., Disp: 18 each, Rfl: 0   diazepam  (VALIUM ) 5 MG tablet, Take 1 tablet (5 mg total) by mouth every 8 (eight) hours as needed for muscle spasms (difficulty urinating)., Disp: 6 tablet, Rfl: 1   fluticasone  (FLONASE ) 50 MCG/ACT nasal spray, Place 2 sprays into both nostrils daily. (Patient taking differently: Place 2 sprays into both nostrils daily as needed for allergies.), Disp: 16 g, Rfl: 2   oxyCODONE -acetaminophen  (PERCOCET) 5-325 MG tablet, Take 1 tablet by mouth every 6 (six) hours as needed for severe pain (pain score 7-10) or moderate pain (pain score 4-6). Can increase to 2 pills at a time as needed, Disp: 30 tablet, Rfl: 0   polyethylene glycol powder (GLYCOLAX /MIRALAX ) 17 GM/SCOOP powder, Take 17 g by mouth daily. (Patient taking differently: Take 17 g by mouth daily as needed (constipation.).), Disp: 3350 g, Rfl: 0   valsartan -hydrochlorothiazide  (DIOVAN -HCT) 80-12.5 MG tablet, Take 1 tablet by mouth daily., Disp: 90 tablet, Rfl: 1   traMADol (ULTRAM) 50 MG tablet, Take 50 mg by mouth every 6 (six) hours as needed. (Patient not taking: Reported on 06/03/2024), Disp: , Rfl:  [2]  Allergies Allergen Reactions   Metformin  And Related     Nausea    Ozempic (0.25 Or 0.5 Mg-Dose) [Semaglutide (0.25 Or 0.5mg -Dos)] Nausea Only    He actually tried Rybelsus  but not on database

## 2024-12-02 ENCOUNTER — Ambulatory Visit: Admitting: Family Medicine
